# Patient Record
Sex: Male | Born: 1960 | Race: White | Hispanic: No | Marital: Married | Smoking: Former smoker
Health system: Southern US, Community
[De-identification: ages and names within clinical notes are randomized; demographics above are authoritative.]

## PROBLEM LIST (undated history)

## (undated) DIAGNOSIS — I2510 Atherosclerotic heart disease of native coronary artery without angina pectoris: Secondary | ICD-10-CM

## (undated) DIAGNOSIS — I10 Essential (primary) hypertension: Secondary | ICD-10-CM

## (undated) DIAGNOSIS — E785 Hyperlipidemia, unspecified: Secondary | ICD-10-CM

## (undated) DIAGNOSIS — D691 Qualitative platelet defects: Secondary | ICD-10-CM

## (undated) DIAGNOSIS — F419 Anxiety disorder, unspecified: Secondary | ICD-10-CM

## (undated) DIAGNOSIS — I639 Cerebral infarction, unspecified: Secondary | ICD-10-CM

## (undated) DIAGNOSIS — I509 Heart failure, unspecified: Secondary | ICD-10-CM

## (undated) HISTORY — PX: CARDIAC CATHETERIZATION: SHX172

## (undated) HISTORY — DX: Atherosclerotic heart disease of native coronary artery without angina pectoris: I25.10

## (undated) HISTORY — DX: Qualitative platelet defects: D69.1

## (undated) HISTORY — DX: Hyperlipidemia, unspecified: E78.5

## (undated) HISTORY — DX: Anxiety disorder, unspecified: F41.9

## (undated) HISTORY — DX: Cerebral infarction, unspecified: I63.9

## (undated) HISTORY — DX: Essential (primary) hypertension: I10

---

## 1999-09-06 HISTORY — PX: CORONARY ARTERY BYPASS GRAFT: SHX141

## 2004-03-25 ENCOUNTER — Other Ambulatory Visit: Payer: Self-pay

## 2004-03-26 ENCOUNTER — Other Ambulatory Visit: Payer: Self-pay

## 2004-04-17 ENCOUNTER — Other Ambulatory Visit: Payer: Self-pay

## 2004-04-18 ENCOUNTER — Other Ambulatory Visit: Payer: Self-pay

## 2005-01-22 ENCOUNTER — Emergency Department: Payer: Self-pay | Admitting: Emergency Medicine

## 2005-04-20 ENCOUNTER — Observation Stay: Payer: Self-pay | Admitting: Internal Medicine

## 2005-04-20 ENCOUNTER — Other Ambulatory Visit: Payer: Self-pay

## 2006-02-14 ENCOUNTER — Other Ambulatory Visit: Payer: Self-pay

## 2006-02-14 ENCOUNTER — Emergency Department: Payer: Self-pay | Admitting: Emergency Medicine

## 2006-07-19 ENCOUNTER — Other Ambulatory Visit: Payer: Self-pay

## 2006-07-20 ENCOUNTER — Other Ambulatory Visit: Payer: Self-pay

## 2006-07-20 ENCOUNTER — Inpatient Hospital Stay: Payer: Self-pay | Admitting: Internal Medicine

## 2006-12-27 ENCOUNTER — Emergency Department: Payer: Self-pay | Admitting: Emergency Medicine

## 2006-12-27 ENCOUNTER — Other Ambulatory Visit: Payer: Self-pay

## 2007-04-08 ENCOUNTER — Other Ambulatory Visit: Payer: Self-pay

## 2007-04-08 ENCOUNTER — Inpatient Hospital Stay: Payer: Self-pay | Admitting: Internal Medicine

## 2007-04-09 ENCOUNTER — Other Ambulatory Visit: Payer: Self-pay

## 2008-01-14 ENCOUNTER — Ambulatory Visit: Payer: Self-pay | Admitting: Cardiovascular Disease

## 2008-10-10 ENCOUNTER — Ambulatory Visit: Payer: Self-pay | Admitting: Family Medicine

## 2010-05-28 ENCOUNTER — Emergency Department: Payer: Self-pay | Admitting: Emergency Medicine

## 2010-10-17 ENCOUNTER — Emergency Department: Payer: Self-pay | Admitting: Emergency Medicine

## 2011-01-31 ENCOUNTER — Inpatient Hospital Stay: Payer: Self-pay | Admitting: Internal Medicine

## 2011-11-25 ENCOUNTER — Observation Stay: Payer: Self-pay | Admitting: Internal Medicine

## 2011-11-25 LAB — BASIC METABOLIC PANEL
Anion Gap: 6 — ABNORMAL LOW (ref 7–16)
BUN: 8 mg/dL (ref 7–18)
Calcium, Total: 8.6 mg/dL (ref 8.5–10.1)
Creatinine: 0.66 mg/dL (ref 0.60–1.30)
EGFR (African American): >60
EGFR (Non-African Amer.): >60
Potassium: 3.8 mmol/L (ref 3.5–5.1)
Sodium: 139 mmol/L (ref 136–145)

## 2011-11-25 LAB — PROTIME-INR
INR: 0.9
Prothrombin Time: 12.1 secs (ref 11.5–14.7)

## 2011-11-25 LAB — COMPREHENSIVE METABOLIC PANEL
Albumin: 4.2 g/dL (ref 3.4–5.0)
Anion Gap: 16 (ref 7–16)
BUN: 9 mg/dL (ref 7–18)
Calcium, Total: 8.9 mg/dL (ref 8.5–10.1)
Glucose: 172 mg/dL — ABNORMAL HIGH (ref 65–99)
Potassium: 4 mmol/L (ref 3.5–5.1)
SGOT(AST): 47 U/L — ABNORMAL HIGH (ref 15–37)
Sodium: 146 mmol/L — ABNORMAL HIGH (ref 136–145)
Total Protein: 7.8 g/dL (ref 6.4–8.2)

## 2011-11-25 LAB — CBC WITH DIFFERENTIAL/PLATELET
Basophil #: 0.1 10*3/uL (ref 0.0–0.1)
Eosinophil #: 0.1 10*3/uL (ref 0.0–0.7)
Eosinophil #: 0 10*3/uL (ref 0.0–0.7)
Eosinophil %: 0.6 %
HCT: 42.6 % (ref 40.0–52.0)
Lymphocyte #: 1.6 10*3/uL (ref 1.0–3.6)
Lymphocyte %: 25.5 %
Lymphocyte %: 30.1 %
MCH: 29.8 pg (ref 26.0–34.0)
MCV: 89 fL (ref 80–100)
MCV: 89 fL (ref 80–100)
Monocyte %: 6.3 %
Monocyte %: 6.2 %
Neutrophil #: 3.9 10*3/uL (ref 1.4–6.5)
Neutrophil #: 3.3 10*3/uL (ref 1.4–6.5)
Neutrophil %: 66 %
Neutrophil %: 62.6 %
RBC: 4.76 10*6/uL (ref 4.40–5.90)
RDW: 13.2 % (ref 11.5–14.5)
WBC: 5.9 10*3/uL (ref 3.8–10.6)
WBC: 5.2 10*3/uL (ref 3.8–10.6)

## 2011-11-25 LAB — MAGNESIUM: Magnesium: 1.7 mg/dL — ABNORMAL LOW

## 2011-11-25 LAB — LIPID PANEL: Ldl Cholesterol, Calc: 51 mg/dL (ref 0–100)

## 2011-11-25 LAB — CK TOTAL AND CKMB (NOT AT ARMC): CK-MB: 0.8 ng/mL (ref 0.5–3.6)

## 2011-11-25 LAB — TROPONIN I: Troponin-I: <0.02 ng/mL

## 2012-01-02 ENCOUNTER — Encounter: Payer: Self-pay | Admitting: Cardiovascular Disease

## 2012-01-02 ENCOUNTER — Ambulatory Visit (INDEPENDENT_AMBULATORY_CARE_PROVIDER_SITE_OTHER): Payer: Medicare Other | Admitting: Cardiovascular Disease

## 2012-01-02 DIAGNOSIS — R079 Chest pain, unspecified: Secondary | ICD-10-CM

## 2012-01-02 DIAGNOSIS — I2510 Atherosclerotic heart disease of native coronary artery without angina pectoris: Secondary | ICD-10-CM | POA: Insufficient documentation

## 2012-01-02 DIAGNOSIS — Z01818 Encounter for other preprocedural examination: Secondary | ICD-10-CM

## 2012-01-02 DIAGNOSIS — Z5181 Encounter for therapeutic drug level monitoring: Secondary | ICD-10-CM

## 2012-01-02 DIAGNOSIS — E785 Hyperlipidemia, unspecified: Secondary | ICD-10-CM | POA: Insufficient documentation

## 2012-01-02 DIAGNOSIS — I10 Essential (primary) hypertension: Secondary | ICD-10-CM | POA: Insufficient documentation

## 2012-01-02 NOTE — Progress Notes (Signed)
HPI  [name] is a 51 year old male who is here to reestablish cardiovascular care. He was last seen by me in 2011. He has known history of coronary artery disease status post CABG as well as stent placement to the first diagonal. Most recent cardiac catheterization was done by me in 2009 which showed patent graft and stent. There was no other obstructive disease. He has done well since then up until recently. He was hospitalized at [facility] in March of this year due to chest pain. He ruled out for myocardial infarction. He had a nuclear stress test done which showed no evidence of ischemia with normal ejection fraction. Unfortunately, the patient continues to have substernal chest tightness radiating to his left arm which happens if he exerts himself. It usually happens if he walks more than 2 blocks and resolves with rest. He took one nitroglycerin once and it relieved the discomfort. The chest pain is associated with dyspnea. He denies palpitations, syncope or presyncope.  Allergies  Allergen Reactions  . Penicillins Hives and Swelling     Current Outpatient Prescriptions on File Prior to Visit  Medication Sig Dispense Refill  . atenolol (TENORMIN) 50 MG tablet Take 1 tablet by mouth Daily.      . CRESTOR 40 MG tablet Take 1 tablet by mouth Every PM.      . GLIPIZIDE XL 5 MG 24 hr tablet Take 1 tablet by mouth Daily.      . isosorbide mononitrate (IMDUR) 30 MG 24 hr tablet Take 3 tablets by mouth Daily.      . metFORMIN (GLUCOPHAGE) 1000 MG tablet Take 1 tablet by mouth Twice daily.      . nitroGLYCERIN (NITROSTAT) 0.4 MG SL tablet Place 0.4 mg under the tongue every 5 (five) minutes as needed.      . quinapril (ACCUPRIL) 10 MG tablet Take 1 tablet by mouth Daily.      . ranitidine (ZANTAC) 150 MG tablet Take 1 tablet by mouth Twice daily.         Past Medical History  Diagnosis Date  . Coronary artery disease     Previous CABG. Most recent cath 01/2008: patent LIMA to LAD, patent  stent in first diagonal. Normal EF.   Marland Kitchen Hyperlipidemia   . Hypertension   . Diabetes mellitus   . Thrombocytopathia   . Anxiety      Past Surgical History  Procedure Date  . Coronary artery bypass graft 2001    [facility]     Family History  Problem Relation Age of Onset  . Diabetes      fx  . Cancer      mat. family     History   Social History  . Marital Status: Married    Spouse Name: N/A    Number of Children: N/A  . Years of Education: N/A   Occupational History  . Not on file.   Social History Main Topics  . Smoking status: Former Games developer  . Smokeless tobacco: Never Used  . Alcohol Use: No  . Drug Use: No  . Sexually Active: Not on file   Other Topics Concern  . Not on file   Social History Narrative  . No narrative on file     ROS Constitutional: Negative for fever, chills, diaphoresis, activity change, appetite change and fatigue.  HENT: Negative for hearing loss, nosebleeds, congestion, sore throat, facial swelling, drooling, trouble swallowing, neck pain, voice change, sinus pressure and tinnitus.  Eyes: Negative  for photophobia, pain, discharge and visual disturbance.  Respiratory: Negative for apnea, cough and wheezing.  Cardiovascular: Negative for  palpitations and leg swelling.  Gastrointestinal: Negative for nausea, vomiting, abdominal pain, diarrhea, constipation, blood in stool and abdominal distention.  Genitourinary: Negative for dysuria, urgency, frequency, hematuria and decreased urine volume.  Musculoskeletal: Negative for myalgias, back pain, joint swelling, arthralgias and gait problem.  Skin: Negative for color change, pallor, rash and wound.  Neurological: Negative for dizziness, tremors, seizures, syncope, speech difficulty, weakness, light-headedness, numbness and headaches.  Psychiatric/Behavioral: Negative for suicidal ideas, hallucinations, behavioral problems and agitation. The patient is  nervous/anxious.     PHYSICAL  EXAM   BP 130/66  Ht 5\' 6"  (1.676 m)  Wt 173 lb (78.472 kg)  BMI 27.92 kg/m2  Constitutional: He is oriented to person, place, and time. He appears well-developed and well-nourished. No distress.  HENT: No nasal discharge.  Head: Normocephalic and atraumatic.  Eyes: Pupils are equal and round. Right eye exhibits no discharge. Left eye exhibits no discharge.  Neck: Normal range of motion. Neck supple. No JVD present. No thyromegaly present.  Cardiovascular: Normal rate, regular rhythm, normal heart sounds and. Exam reveals no gallop and no friction rub. No murmur heard.  Pulmonary/Chest: Effort normal and breath sounds normal. No stridor. No respiratory distress. He has no wheezes. He has no rales. He exhibits no tenderness.  Abdominal: Soft. Bowel sounds are normal. He exhibits no distension. There is no tenderness. There is no rebound and no guarding.  Musculoskeletal: Normal range of motion. He exhibits no edema and no tenderness.  Neurological: He is alert and oriented to person, place, and time. Coordination normal.  Skin: Skin is warm and dry. No rash noted. He is not diaphoretic. No erythema. No pallor.  Psychiatric: He has a normal mood and affect. His behavior is normal. Judgment and thought content normal.      EKG: Normal sinus rhythm with possible old inferior infarct nonspecific T wave changes.   ASSESSMENT AND PLAN

## 2012-01-02 NOTE — Assessment & Plan Note (Signed)
He is on Crestor 40 mg once daily. I recommend a target LDL is less than 70.

## 2012-01-02 NOTE — Patient Instructions (Addendum)
You are scheduled for a Cardiac Cath on Jan 13, 2012 arrive at 7:00 am at [facility] [place] [facility] entrance. Please do not take your metformin the am of procedure.  Do not eat or drink anything after midnight the night before the cardiac cath. You will have blood work today for a CBC,BMET and Pt/INR.  Follow up with Dr. Kirke Corin in two weeks.

## 2012-01-02 NOTE — Assessment & Plan Note (Signed)
The patient continues to have chest pain suggestive of class III angina in spite of optimal medical therapy. There might be a component of anxiety as well. Due to his persistent symptoms, I recommend proceeding with cardiac catheterization for definitive diagnosis. Most recent catheterization was done through the left femoral artery to avoid the scar tissue on the right side.  I will likely try to go through his left radial artery his Allen's test is normal.

## 2012-01-02 NOTE — Assessment & Plan Note (Signed)
His blood pressure was elevated during his recent hospitalization but seems to be optimal at this time. Continue current medications. If high blood pressure is a recurrent problem, we might consider switching atenolol to carvedilol which has better metabolic profile in diabetics especially.

## 2012-01-03 LAB — CBC WITH DIFFERENTIAL/PLATELET
Eos: 2 % (ref 0–7)
Lymphocytes Absolute: 1.4 10*3/uL (ref 0.7–4.5)
Lymphs: 30 % (ref 14–46)
MCV: 86 fL (ref 79–97)
Monocytes: 7 % (ref 4–13)
Neutrophils Absolute: 2.8 10*3/uL (ref 1.8–7.8)

## 2012-01-03 LAB — BASIC METABOLIC PANEL
BUN/Creatinine Ratio: 18 (ref 9–20)
BUN: 14 mg/dL (ref 6–24)
CO2: 21 mmol/L (ref 20–32)
Creatinine, Ser: 0.79 mg/dL (ref 0.76–1.27)
GFR calc Af Amer: 119 mL/min/{1.73_m2} (ref >59)
Glucose: 234 mg/dL — ABNORMAL HIGH (ref 65–99)

## 2012-01-03 LAB — PROTIME-INR
INR: 1 (ref 0.8–1.2)
Prothrombin Time: 10.6 s (ref 9.1–12.0)

## 2012-01-13 ENCOUNTER — Ambulatory Visit: Payer: Self-pay | Admitting: Cardiology

## 2012-01-13 DIAGNOSIS — I2510 Atherosclerotic heart disease of native coronary artery without angina pectoris: Secondary | ICD-10-CM

## 2012-01-14 DIAGNOSIS — I209 Angina pectoris, unspecified: Secondary | ICD-10-CM

## 2012-01-14 LAB — BASIC METABOLIC PANEL
BUN: 6 mg/dL — ABNORMAL LOW (ref 7–18)
Calcium, Total: 8.1 mg/dL — ABNORMAL LOW (ref 8.5–10.1)
Co2: 25 mmol/L (ref 21–32)
Creatinine: 0.79 mg/dL (ref 0.60–1.30)
EGFR (African American): >60
Osmolality: 282 (ref 275–301)
Sodium: 143 mmol/L (ref 136–145)

## 2012-01-14 LAB — CK TOTAL AND CKMB (NOT AT ARMC)
CK, Total: 54 U/L (ref 35–232)
CK-MB: 0.6 ng/mL (ref 0.5–3.6)

## 2012-01-16 ENCOUNTER — Ambulatory Visit (INDEPENDENT_AMBULATORY_CARE_PROVIDER_SITE_OTHER): Payer: Medicare Other | Admitting: Cardiovascular Disease

## 2012-01-16 ENCOUNTER — Encounter: Payer: Self-pay | Admitting: Cardiovascular Disease

## 2012-01-16 VITALS — BP 155/93 | HR 58 | Ht 66.0 in | Wt 168.0 lb

## 2012-01-16 DIAGNOSIS — R0609 Other forms of dyspnea: Secondary | ICD-10-CM

## 2012-01-16 DIAGNOSIS — F411 Generalized anxiety disorder: Secondary | ICD-10-CM

## 2012-01-16 DIAGNOSIS — I10 Essential (primary) hypertension: Secondary | ICD-10-CM

## 2012-01-16 DIAGNOSIS — I2510 Atherosclerotic heart disease of native coronary artery without angina pectoris: Secondary | ICD-10-CM

## 2012-01-16 DIAGNOSIS — E785 Hyperlipidemia, unspecified: Secondary | ICD-10-CM

## 2012-01-16 DIAGNOSIS — F419 Anxiety disorder, unspecified: Secondary | ICD-10-CM | POA: Insufficient documentation

## 2012-01-16 DIAGNOSIS — R0600 Dyspnea, unspecified: Secondary | ICD-10-CM

## 2012-01-16 MED ORDER — LORAZEPAM 0.5 MG PO TABS: 0.5000 mg | ORAL_TABLET | Freq: Three times a day (TID) | ORAL | Status: DC | PRN

## 2012-01-16 NOTE — Progress Notes (Signed)
HPI  Scott Doyle is a 51 year old male who is here today for a followup visit. He was seen by me recently for exertional dyspnea and chest pain. He underwent cardiac catheterization last week which showed patent LIMA to LAD and patent stents in the first diagonal with mild instent restenosis. However, there was a 90% ostial stenosis in the first diagonal. He underwent an angioplasty and drug-eluting stent placement to the ostial diagonal without complications. He has been doing reasonably well. However, it seems that his anxiety has worsened since the procedure. He is not fasting at all and having hard time sleeping at night. He hasn't had any chest discomfort but does complain of occasional dyspnea.  Allergies  Allergen Reactions  . Penicillins Hives and Swelling     Current Outpatient Prescriptions on File Prior to Visit  Medication Sig Dispense Refill  . aspirin 81 MG tablet Take 81 mg by mouth daily.      Marland Kitchen atenolol (TENORMIN) 50 MG tablet Take 1 tablet by mouth Daily.      . CRESTOR 40 MG tablet Take 1 tablet by mouth Every PM.      . fish oil-omega-3 fatty acids 1000 MG capsule Take 1 g by mouth daily.      Marland Kitchen GLIPIZIDE XL 5 MG 24 hr tablet Take 1 tablet by mouth Daily.      . isosorbide mononitrate (IMDUR) 30 MG 24 hr tablet Take 3 tablets by mouth Daily.      . metFORMIN (GLUCOPHAGE) 1000 MG tablet Take 1 tablet by mouth Twice daily.      . nitroGLYCERIN (NITROSTAT) 0.4 MG SL tablet Place 0.4 mg under the tongue every 5 (five) minutes as needed.      . quinapril (ACCUPRIL) 10 MG tablet Take 1 tablet by mouth Daily.      . ranitidine (ZANTAC) 150 MG tablet Take 1 tablet by mouth Twice daily.      . Tamsulosin HCl (FLOMAX) 0.4 MG CAPS Take 1 tablet by mouth Daily.         Past Medical History  Diagnosis Date  . Hyperlipidemia   . Hypertension   . Diabetes mellitus   . Thrombocytopathia   . Anxiety   . Coronary artery disease     Previous CABG. Most recent cath 01/2012:  patent LIMA to LAD, patent stent in first diagonal with mild ISR, 90% ostial disease. Normal EF.  PCI and DES placement to ostial D1 extending into LAD.      Past Surgical History  Procedure Date  . Coronary artery bypass graft 2001    [facility]  . Cardiac catheterization      Family History  Problem Relation Age of Onset  . Diabetes      fx  . Cancer      mat. family  . Hypertension Mother   . Heart disease Mother      History   Social History  . Marital Status: Married    Spouse Name: N/A    Number of Children: N/A  . Years of Education: N/A   Occupational History  . Not on file.   Social History Main Topics  . Smoking status: Former Games developer  . Smokeless tobacco: Never Used  . Alcohol Use: No  . Drug Use: No  . Sexually Active: Not on file   Other Topics Concern  . Not on file   Social History Narrative  . No narrative on file     PHYSICAL EXAM  BP 155/93  Pulse 58  Ht 5\' 6"  (1.676 m)  Wt 168 lb (76.204 kg)  BMI 27.12 kg/m2 Constitutional: He is oriented to person, place, and time. He appears well-developed and well-nourished. No distress.  HENT: No nasal discharge.  Head: Normocephalic and atraumatic.  Eyes: Pupils are equal and round. Right eye exhibits no discharge. Left eye exhibits no discharge.  Neck: Normal range of motion. Neck supple. No JVD present. No thyromegaly present.  Cardiovascular: Normal rate, regular rhythm, normal heart sounds and. Exam reveals no gallop and no friction rub. No murmur heard.  Pulmonary/Chest: Effort normal and breath sounds normal. No stridor. No respiratory distress. He has no wheezes. He has no rales. He exhibits no tenderness.  Abdominal: Soft. Bowel sounds are normal. He exhibits no distension. There is no tenderness. There is no rebound and no guarding.  Musculoskeletal: Normal range of motion. He exhibits no edema and no tenderness.  Neurological: He is alert and oriented to person, place, and time.  Coordination normal.  Skin: Skin is warm and dry. No rash noted. He is not diaphoretic. No erythema. No pallor.  Psychiatric: He has a normal mood and affect. His behavior is normal. Judgment and thought content normal.  Left radial: The pulse is slightly diminished but there is no hematoma. Cardiac cath was attempted to the left radial artery but there was spasm. Right groin: There is no hematoma.     EKG: Sinus bradycardia with nonspecific IVCD. No significant ST changes.   ASSESSMENT AND PLAN

## 2012-01-16 NOTE — Assessment & Plan Note (Signed)
He is on Crestor 40 mg once daily. I recommend a target LDL is less than 70. 

## 2012-01-16 NOTE — Assessment & Plan Note (Signed)
The patient had a recent successful angioplasty and drug-eluting stent placement to the ostial first diagonal. He seems to be doing reasonably well but continues to suffer from significant anxiety. I recommend continuing dual antiplatelet therapy with aspirin and Plavix for at least one year. After that, Plavix can likely be discontinued. Continue aggressive treatment of risk factors. I recommend starting cardiac rehabilitation.

## 2012-01-16 NOTE — Assessment & Plan Note (Signed)
His blood pressure is elevated today is likely due to anxiety. I will Continue to monitor and consider switching atenolol to carvedilol during next visit if needed.

## 2012-01-16 NOTE — Patient Instructions (Signed)
Take Ativan as needed.  Start cardiac rehab.  Follow up in 2 months.

## 2012-01-16 NOTE — Assessment & Plan Note (Signed)
This seems to be a significant issue with him which might affect the healing process. I would provide him with short-term course of Ativan which has helped his symptoms significantly. I asked him to followup with his primary care physician Dr. Mayford Knife for further management. The patient might need to be treated with an SSRI if his symptoms do not improve.

## 2012-01-17 ENCOUNTER — Telehealth: Payer: Self-pay | Admitting: Cardiovascular Disease

## 2012-01-17 DIAGNOSIS — G459 Transient cerebral ischemic attack, unspecified: Secondary | ICD-10-CM

## 2012-01-17 NOTE — Telephone Encounter (Signed)
Dr Kirke Corin will you evaluate this please?

## 2012-01-17 NOTE — Telephone Encounter (Signed)
Can you please schedule this carotid.

## 2012-01-17 NOTE — Telephone Encounter (Signed)
I ordered carotid doppler. Please schedule.

## 2012-01-17 NOTE — Telephone Encounter (Signed)
Pt would like to have a Caroitd US done due to loss and change in vision. Amaurosis fugax. Wants Dr Kirke Corin to order

## 2012-01-24 ENCOUNTER — Encounter: Payer: Self-pay | Admitting: Cardiovascular Disease

## 2012-02-02 ENCOUNTER — Encounter (INDEPENDENT_AMBULATORY_CARE_PROVIDER_SITE_OTHER): Payer: Medicare Other

## 2012-02-02 DIAGNOSIS — R42 Dizziness and giddiness: Secondary | ICD-10-CM

## 2012-02-02 DIAGNOSIS — H53129 Transient visual loss, unspecified eye: Secondary | ICD-10-CM

## 2012-02-02 DIAGNOSIS — I6529 Occlusion and stenosis of unspecified carotid artery: Secondary | ICD-10-CM

## 2012-02-02 DIAGNOSIS — G459 Transient cerebral ischemic attack, unspecified: Secondary | ICD-10-CM

## 2012-02-04 ENCOUNTER — Encounter: Payer: Self-pay | Admitting: Cardiovascular Disease

## 2012-02-13 ENCOUNTER — Telehealth: Payer: Self-pay | Admitting: Cardiovascular Disease

## 2012-02-13 NOTE — Telephone Encounter (Signed)
New Problem: ° ° ° ° ° °Patient called in returning your phone call. Please call back. °

## 2012-02-13 NOTE — Telephone Encounter (Signed)
Carotid doppler results given to pt.

## 2012-03-19 ENCOUNTER — Ambulatory Visit (INDEPENDENT_AMBULATORY_CARE_PROVIDER_SITE_OTHER): Payer: Medicare Other | Admitting: Cardiovascular Disease

## 2012-03-19 ENCOUNTER — Encounter: Payer: Self-pay | Admitting: Cardiovascular Disease

## 2012-03-19 ENCOUNTER — Emergency Department: Payer: Self-pay | Admitting: *Deleted

## 2012-03-19 VITALS — BP 108/62 | HR 61 | Ht 66.0 in | Wt 174.2 lb

## 2012-03-19 DIAGNOSIS — F411 Generalized anxiety disorder: Secondary | ICD-10-CM

## 2012-03-19 DIAGNOSIS — E785 Hyperlipidemia, unspecified: Secondary | ICD-10-CM

## 2012-03-19 DIAGNOSIS — F419 Anxiety disorder, unspecified: Secondary | ICD-10-CM

## 2012-03-19 DIAGNOSIS — I2510 Atherosclerotic heart disease of native coronary artery without angina pectoris: Secondary | ICD-10-CM

## 2012-03-19 DIAGNOSIS — I10 Essential (primary) hypertension: Secondary | ICD-10-CM

## 2012-03-19 NOTE — Assessment & Plan Note (Signed)
He is on Crestor 40 mg and fish oil once daily. I recommend a target LDL is less than 70.

## 2012-03-19 NOTE — Patient Instructions (Addendum)
You are doing well. Continue same medications.  Follow up in 6 months.   

## 2012-03-19 NOTE — Assessment & Plan Note (Signed)
Blood pressure is well controlled. Continue current medications. 

## 2012-03-19 NOTE — Assessment & Plan Note (Addendum)
He has no symptoms suggestive of angina. Continue medical therapy. Continue cardiac rehabilitation.

## 2012-03-19 NOTE — Assessment & Plan Note (Signed)
He had significant improvement with Zoloft. He is off the medication for the last 4 days. I explained to him that Zoloft is one of the safest SSRI from a cardiac standpoint. He is going to followup with Dr. Mayford Knife to discuss whether he needs to continue the medication.

## 2012-03-19 NOTE — Progress Notes (Signed)
HPI  [name] is a 51 year old male who is here today for a followup visit regarding coronary artery disease. Most recent cardiac catheterization in May of this year showed patent LIMA to LAD and patent stents in the first diagonal with mild instent restenosis. However, there was a 90% ostial stenosis in the first diagonal. He underwent an angioplasty and drug-eluting stent placement to the ostial diagonal without complications. He has been doing reasonably well.  During last visit, he had significant anxiety symptoms. He saw his primary care physician Dr. Mayford Knife who prescribed Zoloft with dramatic improvement in his symptoms. He took the medication for one month but prefers not to be on it long-term. He is attending cardiac rehabilitation and seems to be doing very well. He denies chest pain or dyspnea.  Allergies  Allergen Reactions  . Penicillins Hives and Swelling     Current Outpatient Prescriptions on File Prior to Visit  Medication Sig Dispense Refill  . aspirin 81 MG tablet Take 81 mg by mouth daily.      Marland Kitchen atenolol (TENORMIN) 50 MG tablet Take 1 tablet by mouth Daily.      . clopidogrel (PLAVIX) 75 MG tablet Take 75 mg by mouth daily.       . CRESTOR 40 MG tablet Take 1 tablet by mouth Every PM.      . docusate sodium (COLACE) 100 MG capsule Take 100 mg by mouth as directed.      . fish oil-omega-3 fatty acids 1000 MG capsule Take 1 g by mouth daily.      Marland Kitchen GLIPIZIDE XL 5 MG 24 hr tablet Take 1 tablet by mouth Daily.      . isosorbide mononitrate (IMDUR) 30 MG 24 hr tablet Take 3 tablets by mouth Daily.      Marland Kitchen LORazepam (ATIVAN) 0.5 MG tablet Take 1 tablet (0.5 mg total) by mouth every 8 (eight) hours as needed.  30 tablet  0  . metFORMIN (GLUCOPHAGE) 1000 MG tablet Take 1 tablet by mouth Twice daily.      . nitroGLYCERIN (NITROSTAT) 0.4 MG SL tablet Place 0.4 mg under the tongue every 5 (five) minutes as needed.      . quinapril (ACCUPRIL) 10 MG tablet Take 1 tablet by mouth  Daily.      . ranitidine (ZANTAC) 150 MG tablet Take 1 tablet by mouth Twice daily.      . Tamsulosin HCl (FLOMAX) 0.4 MG CAPS Take 1 tablet by mouth Daily.         Past Medical History  Diagnosis Date  . Hyperlipidemia   . Hypertension   . Diabetes mellitus   . Thrombocytopathia   . Anxiety   . Coronary artery disease     Previous CABG. Most recent cath 01/2012: patent LIMA to LAD, patent stent in first diagonal with mild ISR, 90% ostial disease. Normal EF.  PCI and DES placement to ostial D1 extending into LAD.      Past Surgical History  Procedure Date  . Coronary artery bypass graft 2001    [facility]  . Cardiac catheterization      Family History  Problem Relation Age of Onset  . Diabetes      fx  . Cancer      mat. family  . Hypertension Mother   . Heart disease Mother      History   Social History  . Marital Status: Married    Spouse Name: N/A    Number of  Children: N/A  . Years of Education: N/A   Occupational History  . Not on file.   Social History Main Topics  . Smoking status: Former Games developer  . Smokeless tobacco: Never Used  . Alcohol Use: No  . Drug Use: No  . Sexually Active: Not on file   Other Topics Concern  . Not on file   Social History Narrative  . No narrative on file      PHYSICAL EXAM   BP 108/62  Pulse 61  Ht 5\' 6"  (1.676 m)  Wt 174 lb 4 oz (79.039 kg)  BMI 28.12 kg/m2 Constitutional: He is oriented to person, place, and time. He appears well-developed and well-nourished. No distress.  HENT: No nasal discharge.  Head: Normocephalic and atraumatic.  Eyes: Pupils are equal and round. Right eye exhibits no discharge. Left eye exhibits no discharge.  Neck: Normal range of motion. Neck supple. No JVD present. No thyromegaly present.  Cardiovascular: Normal rate, regular rhythm, normal heart sounds and. Exam reveals no gallop and no friction rub. No murmur heard.  Pulmonary/Chest: Effort normal and breath sounds normal. No  stridor. No respiratory distress. He has no wheezes. He has no rales. He exhibits no tenderness.  Abdominal: Soft. Bowel sounds are normal. He exhibits no distension. There is no tenderness. There is no rebound and no guarding.  Musculoskeletal: Normal range of motion. He exhibits no edema and no tenderness.  Neurological: He is alert and oriented to person, place, and time. Coordination normal.  Skin: Skin is warm and dry. No rash noted. He is not diaphoretic. No erythema. No pallor.  Psychiatric: He has a normal mood and affect. His behavior is normal. Judgment and thought content normal.       EKG: Normal sinus rhythm with minor J-point elevation in the inferior leads.   ASSESSMENT AND PLAN

## 2012-03-27 ENCOUNTER — Encounter: Payer: Self-pay | Admitting: Cardiovascular Disease

## 2012-04-09 ENCOUNTER — Encounter: Payer: Self-pay | Admitting: Cardiovascular Disease

## 2012-04-17 ENCOUNTER — Ambulatory Visit: Payer: Self-pay | Admitting: Family Medicine

## 2012-05-06 ENCOUNTER — Encounter: Payer: Self-pay | Admitting: Cardiovascular Disease

## 2012-06-05 ENCOUNTER — Encounter: Payer: Self-pay | Admitting: Cardiovascular Disease

## 2012-09-17 ENCOUNTER — Encounter: Payer: Self-pay | Admitting: Cardiovascular Disease

## 2012-09-17 ENCOUNTER — Ambulatory Visit (INDEPENDENT_AMBULATORY_CARE_PROVIDER_SITE_OTHER): Payer: Medicare Other | Admitting: Cardiovascular Disease

## 2012-09-17 VITALS — BP 118/68 | HR 59 | Ht 66.0 in | Wt 175.8 lb

## 2012-09-17 DIAGNOSIS — I2510 Atherosclerotic heart disease of native coronary artery without angina pectoris: Secondary | ICD-10-CM

## 2012-09-17 DIAGNOSIS — I10 Essential (primary) hypertension: Secondary | ICD-10-CM

## 2012-09-17 DIAGNOSIS — E785 Hyperlipidemia, unspecified: Secondary | ICD-10-CM

## 2012-09-17 MED ORDER — ATENOLOL 50 MG PO TABS: 25.0000 mg | ORAL_TABLET | Freq: Every day | ORAL | Status: DC

## 2012-09-17 NOTE — Assessment & Plan Note (Signed)
Patient is doing very well from a cardiac standpoint. He has no symptoms suggestive of angina. Continue medical therapy. He does complain of mild fatigue. He is mildly bradycardic on current dose of atenolol. Thus, I asked him to decrease the dose to 25 mg once daily.

## 2012-09-17 NOTE — Assessment & Plan Note (Signed)
His blood pressure is well controlled on current medications. 

## 2012-09-17 NOTE — Assessment & Plan Note (Signed)
He was switched from Crestor to atorvastatin 40 mg once daily by Dr. Mayford Knife due to cost reasons. The patient seems to be tolerating the medication without reported side effects.

## 2012-09-17 NOTE — Patient Instructions (Addendum)
Decrease Atenolol to 25 mg once daily (1/2 tablet of 50) Follow up in 1 year.

## 2012-09-17 NOTE — Progress Notes (Signed)
HPI  [name] is a 52 year old male who is here today for a followup visit regarding coronary artery disease. He is status post CABG and PCI of diagonal. Most recent cardiac catheterization in May of 2103 showed patent LIMA to LAD and patent stents in the first diagonal with mild instent restenosis. However, there was a 90% ostial stenosis in the first diagonal. He underwent an angioplasty and drug-eluting stent placement to the ostial diagonal without complications. He has been doing reasonably well.  He denies any chest pain or dyspnea. He does complain of mild fatigue.  Allergies  Allergen Reactions  . Penicillins Hives and Swelling     Current Outpatient Prescriptions on File Prior to Visit  Medication Sig Dispense Refill  . aspirin 81 MG tablet Take 81 mg by mouth daily.      Marland Kitchen atenolol (TENORMIN) 50 MG tablet Take 0.5 tablets (25 mg total) by mouth daily.      Marland Kitchen atorvastatin (LIPITOR) 40 MG tablet Take 40 mg by mouth daily.      . clopidogrel (PLAVIX) 75 MG tablet Take 75 mg by mouth daily.       Marland Kitchen docusate sodium (COLACE) 100 MG capsule Take 100 mg by mouth as directed.      . fish oil-omega-3 fatty acids 1000 MG capsule Take 1 g by mouth daily.      Marland Kitchen GLIPIZIDE XL 5 MG 24 hr tablet Take 0.5 tablets by mouth Daily.       . isosorbide mononitrate (IMDUR) 30 MG 24 hr tablet Take 3 tablets by mouth Daily.      Marland Kitchen LORazepam (ATIVAN) 0.5 MG tablet Take 1 tablet (0.5 mg total) by mouth every 8 (eight) hours as needed.  30 tablet  0  . metFORMIN (GLUCOPHAGE) 1000 MG tablet Take 1 tablet by mouth Twice daily.      . nitroGLYCERIN (NITROSTAT) 0.4 MG SL tablet Place 0.4 mg under the tongue every 5 (five) minutes as needed.      . quinapril (ACCUPRIL) 10 MG tablet Take 1 tablet by mouth Daily.      . ranitidine (ZANTAC) 150 MG tablet Take 1 tablet by mouth Twice daily.      . Tamsulosin HCl (FLOMAX) 0.4 MG CAPS Take 1 tablet by mouth Daily.         Past Medical History  Diagnosis Date   . Hyperlipidemia   . Hypertension   . Diabetes mellitus   . Thrombocytopathia   . Anxiety   . Coronary artery disease     Previous CABG. Most recent cath 01/2012: patent LIMA to LAD, patent stent in first diagonal with mild ISR, 90% ostial disease. Normal EF.  PCI and DES placement to ostial D1 extending into LAD.      Past Surgical History  Procedure Date  . Coronary artery bypass graft 2001    [facility]  . Cardiac catheterization      Family History  Problem Relation Age of Onset  . Diabetes      fx  . Cancer      mat. family  . Hypertension Mother   . Heart disease Mother      History   Social History  . Marital Status: Married    Spouse Name: N/A    Number of Children: N/A  . Years of Education: N/A   Occupational History  . Not on file.   Social History Main Topics  . Smoking status: Former Games developer  . Smokeless tobacco:  Never Used  . Alcohol Use: No  . Drug Use: No  . Sexually Active: Not on file   Other Topics Concern  . Not on file   Social History Narrative  . No narrative on file      PHYSICAL EXAM   BP 118/68  Pulse 59  Ht 5\' 6"  (1.676 m)  Wt 175 lb 12 oz (79.72 kg)  BMI 28.37 kg/m2 Constitutional: He is oriented to person, place, and time. He appears well-developed and well-nourished. No distress.  HENT: No nasal discharge.  Head: Normocephalic and atraumatic.  Eyes: Pupils are equal and round. Right eye exhibits no discharge. Left eye exhibits no discharge.  Neck: Normal range of motion. Neck supple. No JVD present. No thyromegaly present.  Cardiovascular: Normal rate, regular rhythm, normal heart sounds and. Exam reveals no gallop and no friction rub. No murmur heard.  Pulmonary/Chest: Effort normal and breath sounds normal. No stridor. No respiratory distress. He has no wheezes. He has no rales. He exhibits no tenderness.  Abdominal: Soft. Bowel sounds are normal. He exhibits no distension. There is no tenderness. There is no rebound  and no guarding.  Musculoskeletal: Normal range of motion. He exhibits no edema and no tenderness.  Neurological: He is alert and oriented to person, place, and time. Coordination normal.  Skin: Skin is warm and dry. No rash noted. He is not diaphoretic. No erythema. No pallor.  Psychiatric: He has a normal mood and affect. His behavior is normal. Judgment and thought content normal.       EKG: Normal sinus rhythm with minor J-point elevation in the inferior leads.   ASSESSMENT AND PLAN

## 2013-01-08 IMAGING — CR SACRUM AND COCCYX - 2+ VIEW
1 series · 3 of 3 positions shown · non-contrast
Comparison: none

REASON FOR EXAM: sciatica with burning pain lower back down leg
COMMENTS:

PROCEDURE:     DXR - DXR SACRUM AND COCCYX  - April 17, 2012  [DATE]
RESULT:     Images of the sacrum and coccyx are obtained. The alignment
appears to be maintained. A definite fracture is not identified. Lumbosacral
junction appears normal. The sacroiliac joints appear to be unremarkable.

[Series 1: t sacrum ap · 0.14mm/px · 3 of 3 slices shown]
[im 1/3]
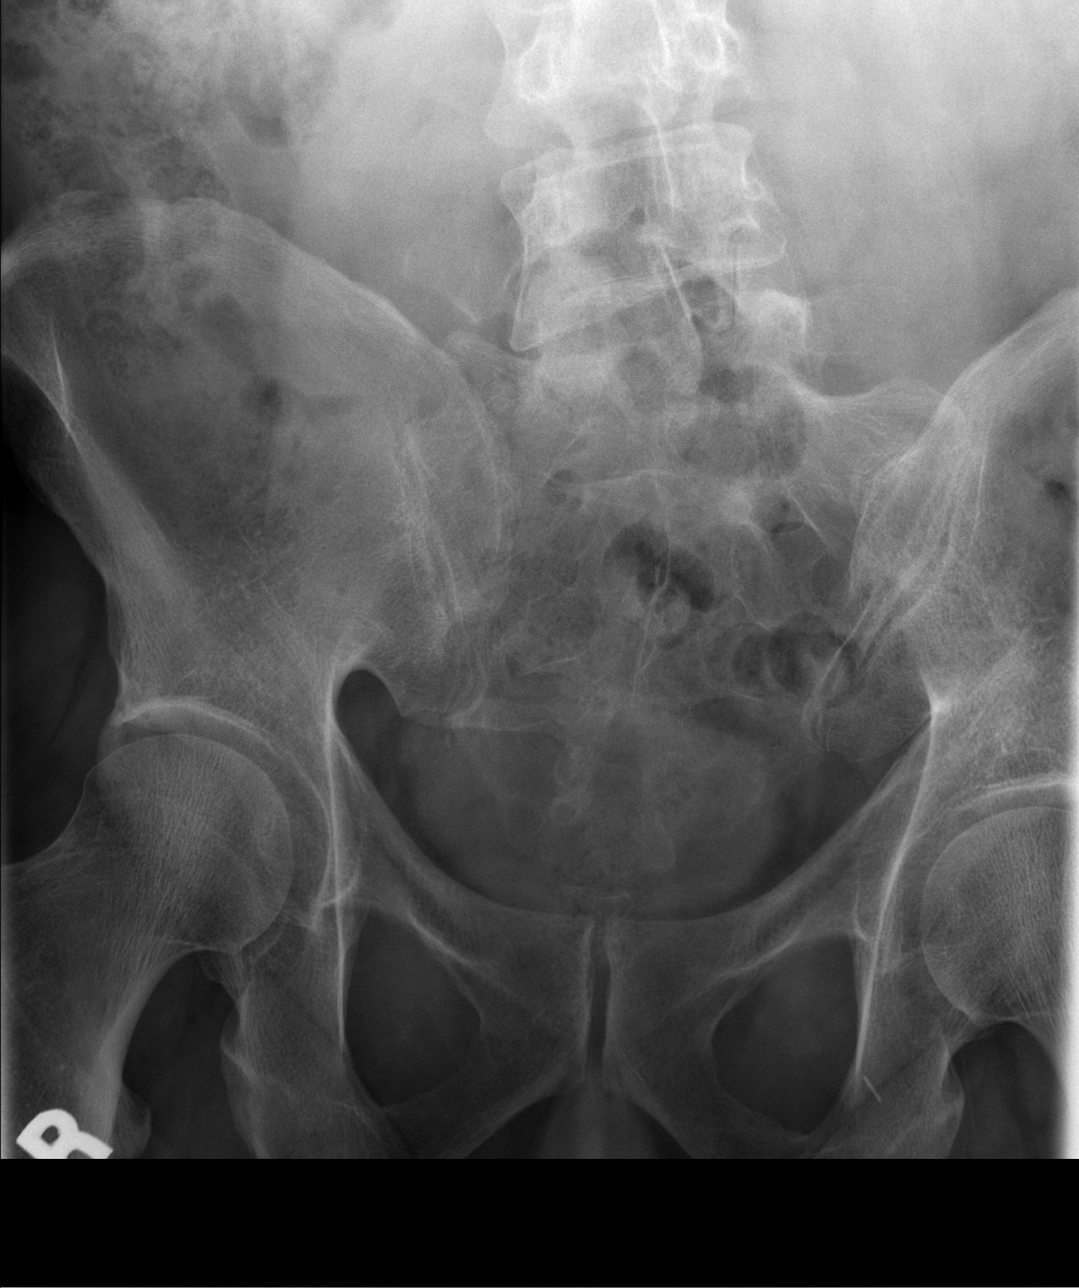
[im 2/3]
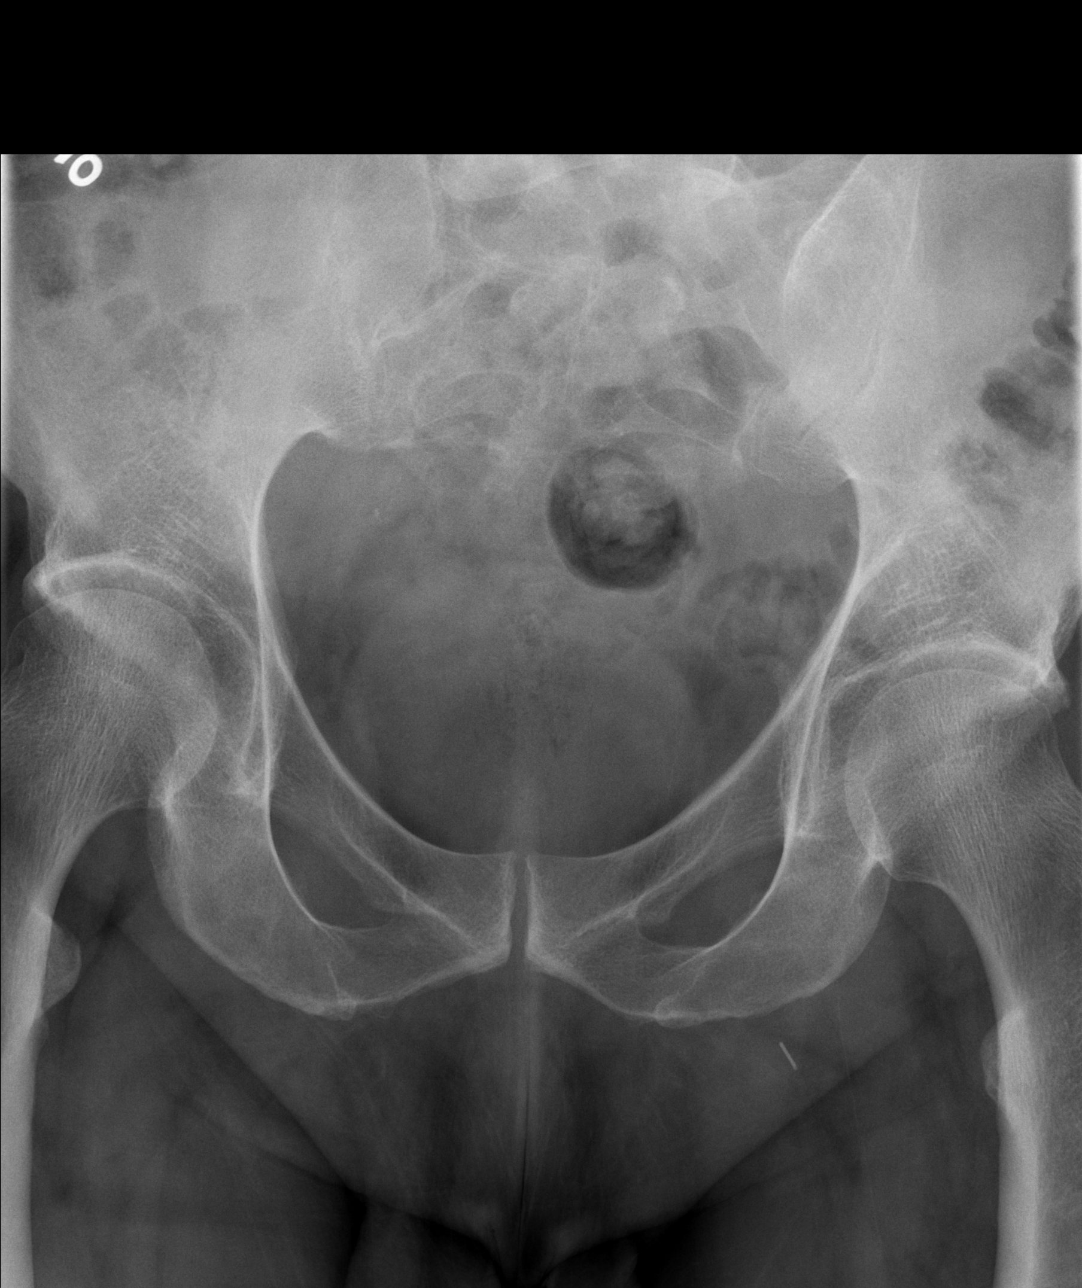
[im 3/3]
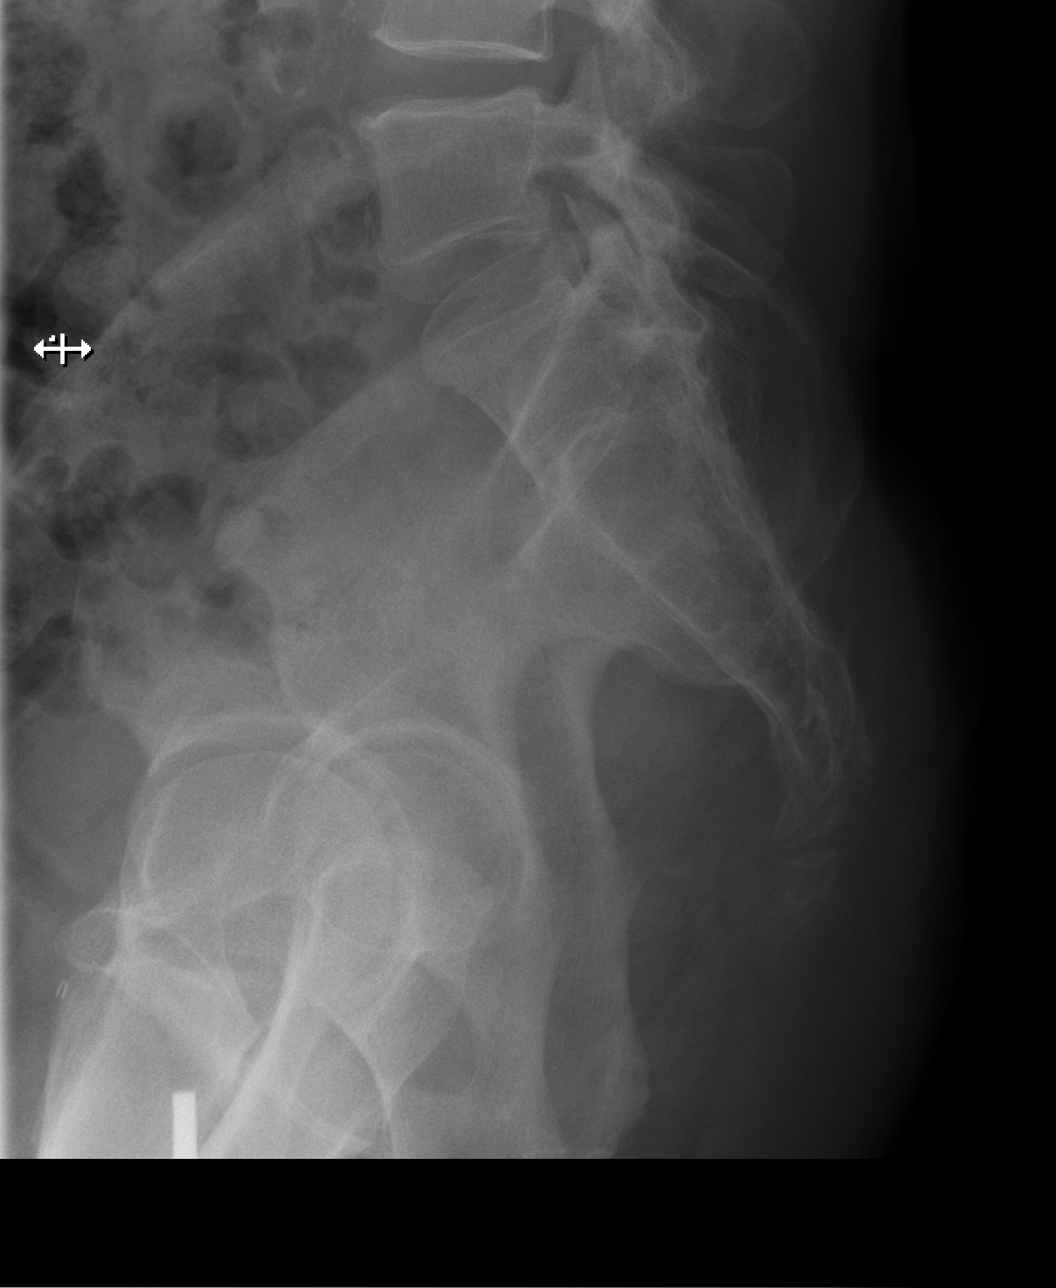

[3 of 3 positions shown; findings below may reference images not displayed]

IMPRESSION: 1. No radiographic evidence of acute sacral or coccyx abnormality. MRI or
nuclear medicine to be considered for further investigation.

[REDACTED]

## 2013-01-11 ENCOUNTER — Telehealth: Payer: Self-pay

## 2013-01-11 NOTE — Telephone Encounter (Signed)
Request from Disability Determination Services , sent to [employer] on 01/11/2013 .

## 2013-02-20 ENCOUNTER — Encounter (INDEPENDENT_AMBULATORY_CARE_PROVIDER_SITE_OTHER): Payer: Medicare Other

## 2013-02-20 DIAGNOSIS — I6529 Occlusion and stenosis of unspecified carotid artery: Secondary | ICD-10-CM

## 2013-06-26 LAB — CBC
HCT: 41.4 % (ref 40.0–52.0)
HGB: 14.3 g/dL (ref 13.0–18.0)
MCH: 29.5 pg (ref 26.0–34.0)
MCV: 85 fL (ref 80–100)
RBC: 4.85 10*6/uL (ref 4.40–5.90)
WBC: 5.5 10*3/uL (ref 3.8–10.6)

## 2013-06-26 LAB — BASIC METABOLIC PANEL
BUN: 8 mg/dL (ref 7–18)
Calcium, Total: 8.7 mg/dL (ref 8.5–10.1)
Co2: 25 mmol/L (ref 21–32)
Creatinine: 1.06 mg/dL (ref 0.60–1.30)
EGFR (African American): >60
EGFR (Non-African Amer.): >60
Osmolality: 279 (ref 275–301)
Potassium: 3.8 mmol/L (ref 3.5–5.1)
Sodium: 137 mmol/L (ref 136–145)

## 2013-06-26 LAB — TROPONIN I: Troponin-I: <0.02 ng/mL

## 2013-06-27 ENCOUNTER — Observation Stay: Payer: Self-pay | Admitting: Internal Medicine

## 2013-06-27 DIAGNOSIS — E785 Hyperlipidemia, unspecified: Secondary | ICD-10-CM

## 2013-06-27 DIAGNOSIS — R079 Chest pain, unspecified: Secondary | ICD-10-CM

## 2013-06-27 DIAGNOSIS — I10 Essential (primary) hypertension: Secondary | ICD-10-CM

## 2013-06-27 DIAGNOSIS — Z9861 Coronary angioplasty status: Secondary | ICD-10-CM

## 2013-06-27 DIAGNOSIS — Z8679 Personal history of other diseases of the circulatory system: Secondary | ICD-10-CM

## 2013-06-27 LAB — TROPONIN I: Troponin-I: <0.02 ng/mL

## 2013-06-27 LAB — CK TOTAL AND CKMB (NOT AT ARMC)
CK, Total: 70 U/L (ref 35–232)
CK, Total: 65 U/L (ref 35–232)
CK, Total: 65 U/L (ref 35–232)
CK-MB: 1.2 ng/mL (ref 0.5–3.6)
CK-MB: 0.9 ng/mL (ref 0.5–3.6)

## 2013-07-12 ENCOUNTER — Encounter: Payer: Medicare Other | Admitting: Cardiovascular Disease

## 2013-07-18 ENCOUNTER — Ambulatory Visit (INDEPENDENT_AMBULATORY_CARE_PROVIDER_SITE_OTHER): Payer: Medicare Other | Admitting: Cardiovascular Disease

## 2013-07-18 ENCOUNTER — Encounter: Payer: Self-pay | Admitting: Cardiovascular Disease

## 2013-07-18 VITALS — BP 140/77 | HR 55 | Ht 69.0 in | Wt 179.8 lb

## 2013-07-18 DIAGNOSIS — E785 Hyperlipidemia, unspecified: Secondary | ICD-10-CM

## 2013-07-18 DIAGNOSIS — I2510 Atherosclerotic heart disease of native coronary artery without angina pectoris: Secondary | ICD-10-CM

## 2013-07-18 DIAGNOSIS — I10 Essential (primary) hypertension: Secondary | ICD-10-CM

## 2013-07-18 NOTE — Progress Notes (Signed)
HPI  [name] is a 52 year old male who is here today for a followup visit regarding coronary artery disease. He is status post CABG and PCI of diagonal. Most recent cardiac catheterization in May of 2103 showed patent LIMA to LAD and patent stents in the first diagonal with mild instent restenosis. However, there was a 90% ostial stenosis in the first diagonal. He underwent an angioplasty and drug-eluting stent placement to the ostial diagonal without complications.  He presented recently to [facility] with atypical chest pain. He ruled out for MI. Nuclear stress test showed no ischemia. No recurrent chest pain since then.   Allergies  Allergen Reactions  . Penicillins Hives and Swelling     Current Outpatient Prescriptions on File Prior to Visit  Medication Sig Dispense Refill  . aspirin 81 MG tablet Take 81 mg by mouth daily.      Marland Kitchen atenolol (TENORMIN) 50 MG tablet Take 0.5 tablets (25 mg total) by mouth daily.      Marland Kitchen atorvastatin (LIPITOR) 40 MG tablet Take 40 mg by mouth daily.      . fish oil-omega-3 fatty acids 1000 MG capsule Take 1 g by mouth daily.      Marland Kitchen GLIPIZIDE XL 5 MG 24 hr tablet Take 0.5 tablets by mouth Daily.       . isosorbide mononitrate (IMDUR) 30 MG 24 hr tablet Take 3 tablets by mouth Daily.      Marland Kitchen LORazepam (ATIVAN) 0.5 MG tablet Take 1 tablet (0.5 mg total) by mouth every 8 (eight) hours as needed.  30 tablet  0  . metFORMIN (GLUCOPHAGE) 1000 MG tablet Take 1 tablet by mouth Twice daily.      . nitroGLYCERIN (NITROSTAT) 0.4 MG SL tablet Place 0.4 mg under the tongue every 5 (five) minutes as needed.      . quinapril (ACCUPRIL) 10 MG tablet Take 1 tablet by mouth Daily.      . ranitidine (ZANTAC) 150 MG tablet Take 1 tablet by mouth Twice daily.      . Tamsulosin HCl (FLOMAX) 0.4 MG CAPS Take 1 tablet by mouth Daily.       No current facility-administered medications on file prior to visit.     Past Medical History  Diagnosis Date  . Hyperlipidemia   .  Hypertension   . Diabetes mellitus   . Thrombocytopathia   . Anxiety   . Coronary artery disease     Previous CABG. Most recent cath 01/2012: patent LIMA to LAD, patent stent in first diagonal with mild ISR, 90% ostial disease. Normal EF.  PCI and DES placement to ostial D1 extending into LAD.      Past Surgical History  Procedure Laterality Date  . Coronary artery bypass graft  2001    [facility]  . Cardiac catheterization       Family History  Problem Relation Age of Onset  . Diabetes      fx  . Cancer      mat. family  . Hypertension Mother   . Heart disease Mother      History   Social History  . Marital Status: Married    Spouse Name: N/A    Number of Children: N/A  . Years of Education: N/A   Occupational History  . Not on file.   Social History Main Topics  . Smoking status: Former Games developer  . Smokeless tobacco: Never Used  . Alcohol Use: No  . Drug Use: No  . Sexual  Activity: Not on file   Other Topics Concern  . Not on file   Social History Narrative  . No narrative on file      PHYSICAL EXAM   BP 140/77  Pulse 55  Ht 5\' 9"  (1.753 m)  Wt 179 lb 12 oz (81.534 kg)  BMI 26.53 kg/m2 Constitutional: He is oriented to person, place, and time. He appears well-developed and well-nourished. No distress.  HENT: No nasal discharge.  Head: Normocephalic and atraumatic.  Eyes: Pupils are equal and round. Right eye exhibits no discharge. Left eye exhibits no discharge.  Neck: Normal range of motion. Neck supple. No JVD present. No thyromegaly present.  Cardiovascular: Normal rate, regular rhythm, normal heart sounds and. Exam reveals no gallop and no friction rub. No murmur heard.  Pulmonary/Chest: Effort normal and breath sounds normal. No stridor. No respiratory distress. He has no wheezes. He has no rales. He exhibits no tenderness.  Abdominal: Soft. Bowel sounds are normal. He exhibits no distension. There is no tenderness. There is no rebound and no  guarding.  Musculoskeletal: Normal range of motion. He exhibits no edema and no tenderness.  Neurological: He is alert and oriented to person, place, and time. Coordination normal.  Skin: Skin is warm and dry. No rash noted. He is not diaphoretic. No erythema. No pallor.  Psychiatric: He has a normal mood and affect. His behavior is normal. Judgment and thought content normal.       EKG: Sinus  Bradycardia  -Short PR syndrome  PRi = 116 -Old inferior-apical infarct  -Incomplete right bundle branch block   ABNORMAL    ASSESSMENT AND PLAN

## 2013-07-18 NOTE — Assessment & Plan Note (Signed)
Continue Atorvastatin.  No results found for this basename: CHOL, HDL, LDLCALC, LDLDIRECT, TRIG, CHOLHDL

## 2013-07-18 NOTE — Patient Instructions (Signed)
Continue same medications.   Your physician wants you to follow-up in: 12 months.  You will receive a reminder letter in the mail two months in advance. If you don't receive a letter, please call our office to schedule the follow-up appointment.  

## 2013-07-18 NOTE — Assessment & Plan Note (Signed)
BP is well controlled 

## 2013-07-18 NOTE — Assessment & Plan Note (Signed)
No recurrent chest pain. Recent stress test showed no evidence of ischemia. Continue medical therapy.  Some of his symptoms seem to be anxiety induced.

## 2013-08-06 ENCOUNTER — Ambulatory Visit: Payer: Self-pay | Admitting: Gastroenterology

## 2013-08-06 LAB — CBC WITH DIFFERENTIAL/PLATELET
Basophil #: 0.1 10*3/uL (ref 0.0–0.1)
Basophil %: 0.8 %
HCT: 44.1 % (ref 40.0–52.0)
HGB: 15.3 g/dL (ref 13.0–18.0)
Lymphocyte %: 22.2 %
MCHC: 34.8 g/dL (ref 32.0–36.0)
MCV: 85 fL (ref 80–100)
Monocyte #: 0.4 x10 3/mm (ref 0.2–1.0)
Neutrophil #: 4.2 10*3/uL (ref 1.4–6.5)
Neutrophil %: 61 %
RBC: 5.2 10*6/uL (ref 4.40–5.90)
RDW: 13.3 % (ref 11.5–14.5)
WBC: 6.9 10*3/uL (ref 3.8–10.6)

## 2014-08-05 HISTORY — PX: CARDIAC CATHETERIZATION: SHX172

## 2014-08-06 ENCOUNTER — Emergency Department: Payer: Self-pay | Admitting: Emergency Medicine

## 2014-08-06 LAB — BASIC METABOLIC PANEL
Anion Gap: 7 (ref 7–16)
BUN: 10 mg/dL (ref 7–18)
CHLORIDE: 101 mmol/L (ref 98–107)
CREATININE: 1.01 mg/dL (ref 0.60–1.30)
Calcium, Total: 8.9 mg/dL (ref 8.5–10.1)
Co2: 30 mmol/L (ref 21–32)
EGFR (African American): >60
EGFR (Non-African Amer.): >60
GLUCOSE: 178 mg/dL — AB (ref 65–99)
OSMOLALITY: 279 (ref 275–301)
POTASSIUM: 3.7 mmol/L (ref 3.5–5.1)
Sodium: 138 mmol/L (ref 136–145)

## 2014-08-06 LAB — CBC
HCT: 45.7 % (ref 40.0–52.0)
HGB: 15.2 g/dL (ref 13.0–18.0)
MCH: 29.1 pg (ref 26.0–34.0)
MCHC: 33.2 g/dL (ref 32.0–36.0)
MCV: 88 fL (ref 80–100)
PLATELETS: 140 10*3/uL — AB (ref 150–440)
RBC: 5.21 10*6/uL (ref 4.40–5.90)
RDW: 13.1 % (ref 11.5–14.5)
WBC: 5.9 10*3/uL (ref 3.8–10.6)

## 2014-08-06 LAB — PRO B NATRIURETIC PEPTIDE: B-Type Natriuretic Peptide: 45 pg/mL (ref 0–125)

## 2014-08-06 LAB — TROPONIN I: Troponin-I: <0.02 ng/mL

## 2014-08-11 ENCOUNTER — Ambulatory Visit (INDEPENDENT_AMBULATORY_CARE_PROVIDER_SITE_OTHER): Payer: Medicare Other | Admitting: Cardiovascular Disease

## 2014-08-11 ENCOUNTER — Encounter: Payer: Self-pay | Admitting: Cardiovascular Disease

## 2014-08-11 VITALS — BP 120/70 | HR 60 | Ht 68.0 in | Wt 177.5 lb

## 2014-08-11 DIAGNOSIS — E785 Hyperlipidemia, unspecified: Secondary | ICD-10-CM

## 2014-08-11 DIAGNOSIS — R079 Chest pain, unspecified: Secondary | ICD-10-CM

## 2014-08-11 DIAGNOSIS — I10 Essential (primary) hypertension: Secondary | ICD-10-CM

## 2014-08-11 DIAGNOSIS — I25118 Atherosclerotic heart disease of native coronary artery with other forms of angina pectoris: Secondary | ICD-10-CM

## 2014-08-11 NOTE — Assessment & Plan Note (Signed)
The patient's current symptoms are suggestive of class III angina in spite of treatment with a beta blocker and long-acting nitroglycerin. I discussed different management options with him including risk stratification with stress testing versus proceeding directly with cardiac catheterization. Given the significant limitations he is having and the maximum medical therapy he is on, I favor proceeding directly with cardiac catheterization. He agrees. I discussed the risks of the procedure extensively.

## 2014-08-11 NOTE — Patient Instructions (Signed)
[facility] Cardiac Cath Instructions   You are scheduled for a Cardiac Cath on:__12/10/15_____  Please arrive at 0730 am on the day of your procedure  You will need to pre-register prior to the day of your procedure.  Enter through the [employer] at [facility].  Registration is the first desk on your right.  Please take the procedure order we have given you in order to be registered appropriately  Do not eat/drink anything after midnight  Someone will need to drive you home  It is recommended someone be with you for the first 24 hours after your procedure  Wear clothes that are easy to get on/off and wear slip on shoes if possible   Medications bring a current list of all medications with you   _x__ Do not take these medications before your procedure: Hold Metformin the day before, the day of and two days after you procedure. Take all other medications as ordered with enough water to swallow safely.  Day of your procedure: Arrive at the [facility] entrance.  Free valet service is available.  After entering the [place] please check-in at the registration desk (1st desk on your right) to receive your armband. After receiving your armband someone will escort you to the cardiac cath/special procedures waiting area.  The usual length of stay after your procedure is about 2 to 3 hours.  This can vary.  If you have any questions, please call our office at [phone], or you may call the cardiac cath lab at [facility] directly at [phone]

## 2014-08-11 NOTE — Assessment & Plan Note (Signed)
Continue treatment with atorvastatin with a target LDL of less than 70. 

## 2014-08-11 NOTE — Progress Notes (Signed)
HPI  [name] is a 53 year old male who is here today for a followup visit regarding coronary artery disease. He is status post CABG and PCI of diagonal. Most recent cardiac catheterization in May of 2103 showed patent LIMA to LAD and patent stents in the first diagonal with mild instent restenosis. However, there was a 90% ostial stenosis in the first diagonal. He underwent an angioplasty and drug-eluting stent placement to the ostial diagonal without complications.  He presented  to [facility] in 2014 with atypical chest pain. He ruled out for MI. Nuclear stress test showed no ischemia.  He reports recent episodes of substernal chest tightness with activities which started about 2 weeks ago and has been getting worse. It is now happening with minimal activities. The chest pain is substernal with no radiation. It has been associated with shortness of breath. He denies chest pain at rest. He called EMS on Friday. ECG showed minimal ST elevation in the inferior leads. He was taken to the emergency room. Cardiac enzymes were negative. Chest x-ray showed no acute abnormality.  Allergies  Allergen Reactions  . Ampicillin Hives  . Penicillins Hives and Swelling     Current Outpatient Prescriptions on File Prior to Visit  Medication Sig Dispense Refill  . aspirin 81 MG tablet Take 81 mg by mouth daily.    Marland Kitchen atenolol (TENORMIN) 50 MG tablet Take 0.5 tablets (25 mg total) by mouth daily.    Marland Kitchen atorvastatin (LIPITOR) 40 MG tablet Take 40 mg by mouth daily.    . fish oil-omega-3 fatty acids 1000 MG capsule Take 1 g by mouth daily.    Marland Kitchen GLIPIZIDE XL 5 MG 24 hr tablet Take 0.5 tablets by mouth Daily.     . isosorbide mononitrate (IMDUR) 30 MG 24 hr tablet Take 3 tablets by mouth Daily.    Marland Kitchen LORazepam (ATIVAN) 0.5 MG tablet Take 1 tablet (0.5 mg total) by mouth every 8 (eight) hours as needed. 30 tablet 0  . metFORMIN (GLUCOPHAGE) 1000 MG tablet Take 1 tablet by mouth Twice daily.    . nitroGLYCERIN  (NITROSTAT) 0.4 MG SL tablet Place 0.4 mg under the tongue every 5 (five) minutes as needed.    . quinapril (ACCUPRIL) 10 MG tablet Take 1 tablet by mouth Daily.    . ranitidine (ZANTAC) 150 MG tablet Take 1 tablet by mouth Twice daily.    . Tamsulosin HCl (FLOMAX) 0.4 MG CAPS Take 1 tablet by mouth Daily.     No current facility-administered medications on file prior to visit.     Past Medical History  Diagnosis Date  . Hyperlipidemia   . Hypertension   . Diabetes mellitus   . Thrombocytopathia   . Anxiety   . Coronary artery disease     Previous CABG. Most recent cath 01/2012: patent LIMA to LAD, patent stent in first diagonal with mild ISR, 90% ostial disease. Normal EF.  PCI and DES placement to ostial D1 extending into LAD.      Past Surgical History  Procedure Laterality Date  . Coronary artery bypass graft  2001    [facility]  . Cardiac catheterization       Family History  Problem Relation Age of Onset  . Diabetes      fx  . Cancer      mat. family  . Hypertension Mother   . Heart disease Mother      History   Social History  . Marital Status: Married  Spouse Name: N/A    Number of Children: N/A  . Years of Education: N/A   Occupational History  . Not on file.   Social History Main Topics  . Smoking status: Former Research scientist (life sciences)  . Smokeless tobacco: Never Used  . Alcohol Use: No  . Drug Use: No  . Sexual Activity: Not on file   Other Topics Concern  . Not on file   Social History Narrative      PHYSICAL EXAM   BP 120/70 mmHg  Pulse 60  Ht 5\' 8"  (1.727 m)  Wt 177 lb 8 oz (80.513 kg)  BMI 26.99 kg/m2 Constitutional: He is oriented to person, place, and time. He appears well-developed and well-nourished. No distress.  HENT: No nasal discharge.  Head: Normocephalic and atraumatic.  Eyes: Pupils are equal and round. Right eye exhibits no discharge. Left eye exhibits no discharge.  Neck: Normal range of motion. Neck supple. No JVD present. No  thyromegaly present.  Cardiovascular: Normal rate, regular rhythm, normal heart sounds and. Exam reveals no gallop and no friction rub. No murmur heard.  Pulmonary/Chest: Effort normal and breath sounds normal. No stridor. No respiratory distress. He has no wheezes. He has no rales. He exhibits no tenderness.  Abdominal: Soft. Bowel sounds are normal. He exhibits no distension. There is no tenderness. There is no rebound and no guarding.  Musculoskeletal: Normal range of motion. He exhibits no edema and no tenderness.  Neurological: He is alert and oriented to person, place, and time. Coordination normal.  Skin: Skin is warm and dry. No rash noted. He is not diaphoretic. No erythema. No pallor.  Psychiatric: He has a normal mood and affect. His behavior is normal. Judgment and thought content normal.       EKG: Sinus  Rhythm  Low voltage with rightward P-axis and rotation -possible pulmonary disease.   ABNORMAL    ASSESSMENT AND PLAN

## 2014-08-11 NOTE — Assessment & Plan Note (Signed)
Blood pressure is well controlled on current medications. 

## 2014-08-13 ENCOUNTER — Telehealth: Payer: Self-pay | Admitting: *Deleted

## 2014-08-13 NOTE — Telephone Encounter (Signed)
Faxed orders to cardiac cath lab at [facility]

## 2014-08-13 NOTE — Telephone Encounter (Signed)
Ivin Booty confirmed receipt

## 2014-08-14 ENCOUNTER — Ambulatory Visit: Payer: Self-pay | Admitting: Cardiovascular Disease

## 2014-08-14 DIAGNOSIS — I2510 Atherosclerotic heart disease of native coronary artery without angina pectoris: Secondary | ICD-10-CM

## 2014-08-15 ENCOUNTER — Encounter: Payer: Self-pay | Admitting: *Deleted

## 2014-08-22 ENCOUNTER — Ambulatory Visit (INDEPENDENT_AMBULATORY_CARE_PROVIDER_SITE_OTHER): Payer: Medicare Other | Admitting: Cardiovascular Disease

## 2014-08-22 ENCOUNTER — Encounter: Payer: Self-pay | Admitting: *Deleted

## 2014-08-22 VITALS — BP 110/70 | HR 55 | Ht 67.0 in | Wt 177.8 lb

## 2014-08-22 DIAGNOSIS — Z9889 Other specified postprocedural states: Secondary | ICD-10-CM

## 2014-08-22 DIAGNOSIS — E785 Hyperlipidemia, unspecified: Secondary | ICD-10-CM

## 2014-08-22 DIAGNOSIS — I10 Essential (primary) hypertension: Secondary | ICD-10-CM

## 2014-08-22 DIAGNOSIS — I2510 Atherosclerotic heart disease of native coronary artery without angina pectoris: Secondary | ICD-10-CM

## 2014-08-22 NOTE — Assessment & Plan Note (Signed)
Blood pressure is well controlled on current medications. 

## 2014-08-22 NOTE — Patient Instructions (Signed)
Continue same medications.   Your physician wants you to follow-up in: 6 months.  You will receive a reminder letter in the mail two months in advance. If you don't receive a letter, please call our office to schedule the follow-up appointment.  

## 2014-08-22 NOTE — Assessment & Plan Note (Signed)
Recent cardiac catheterization showed no evidence of obstructive coronary artery disease. I recommend continuing medical therapy. Chest pain resolved. There was a component of anxiety which might have contributed.

## 2014-08-22 NOTE — Assessment & Plan Note (Signed)
Continue treatment with rosuvastatin with a target LDL of less than 70. This is being monitored by Dr. Lavera Guise.

## 2014-08-22 NOTE — Progress Notes (Signed)
HPI  Scott Doyle is a 53 year old male who is here today for a followup visit regarding coronary artery disease. He is status post CABG and PCI of diagonal. He has other medical problems that include hypertension, hyperlipidemia and type 2 diabetes. He was seen recently for worsening exertional chest pain. I proceeded with cardiac catheterization which showed patent LIMA to LAD and patent stents in the diagonal. There was no evidence of obstructive coronary artery disease. He has been doing well since his cardiac cath and actually reports resolution of chest discomfort.  Allergies  Allergen Reactions  . Ampicillin Hives  . Penicillins Hives and Swelling     Current Outpatient Prescriptions on File Prior to Visit  Medication Sig Dispense Refill  . aspirin 81 MG tablet Take 81 mg by mouth daily.    Marland Kitchen atenolol (TENORMIN) 50 MG tablet Take 0.5 tablets (25 mg total) by mouth daily. (Patient taking differently: Take 50 mg by mouth daily. )    . atorvastatin (LIPITOR) 40 MG tablet Take 40 mg by mouth daily.    . fish oil-omega-3 fatty acids 1000 MG capsule Take 1 g by mouth daily.    . isosorbide mononitrate (IMDUR) 30 MG 24 hr tablet Take 3 tablets by mouth Daily.    Marland Kitchen LORazepam (ATIVAN) 0.5 MG tablet Take 1 tablet (0.5 mg total) by mouth every 8 (eight) hours as needed. 30 tablet 0  . metFORMIN (GLUCOPHAGE) 1000 MG tablet Take 1 tablet by mouth Twice daily.    . nitroGLYCERIN (NITROSTAT) 0.4 MG SL tablet Place 0.4 mg under the tongue every 5 (five) minutes as needed.    . quinapril (ACCUPRIL) 10 MG tablet Take 1 tablet by mouth Daily.    . ranitidine (ZANTAC) 150 MG tablet Take 1 tablet by mouth Twice daily.    . rosuvastatin (CRESTOR) 40 MG tablet Take 40 mg by mouth daily.    . Tamsulosin HCl (FLOMAX) 0.4 MG CAPS Take 1 tablet by mouth Daily.     No current facility-administered medications on file prior to visit.     Past Medical History  Diagnosis Date  . Hyperlipidemia   .  Hypertension   . Diabetes mellitus   . Thrombocytopathia   . Anxiety   . Coronary artery disease     Previous CABG. Most recent cath 01/2012: patent LIMA to LAD, patent stent in first diagonal with mild ISR, 90% ostial disease. Normal EF.  PCI and DES placement to ostial D1 extending into LAD.      Past Surgical History  Procedure Laterality Date  . Coronary artery bypass graft  2001    [facility]  . Cardiac catheterization    . Cardiac catheterization  08/2014     Family History  Problem Relation Age of Onset  . Diabetes      fx  . Cancer      mat. family  . Hypertension Mother   . Heart disease Mother      History   Social History  . Marital Status: Married    Spouse Name: N/A    Number of Children: N/A  . Years of Education: N/A   Occupational History  . Not on file.   Social History Main Topics  . Smoking status: Former Research scientist (life sciences)  . Smokeless tobacco: Never Used  . Alcohol Use: No  . Drug Use: No  . Sexual Activity: Not on file   Other Topics Concern  . Not on file   Social History Narrative  PHYSICAL EXAM   BP 110/70 mmHg  Pulse 55  Ht 5\' 7"  (1.702 m)  Wt 177 lb 12 oz (80.627 kg)  BMI 27.83 kg/m2 Constitutional: He is oriented to person, place, and time. He appears well-developed and well-nourished. No distress.  HENT: No nasal discharge.  Head: Normocephalic and atraumatic.  Eyes: Pupils are equal and round. Right eye exhibits no discharge. Left eye exhibits no discharge.  Neck: Normal range of motion. Neck supple. No JVD present. No thyromegaly present.  Cardiovascular: Normal rate, regular rhythm, normal heart sounds and. Exam reveals no gallop and no friction rub. No murmur heard.  Pulmonary/Chest: Effort normal and breath sounds normal. No stridor. No respiratory distress. He has no wheezes. He has no rales. He exhibits no tenderness.  Abdominal: Soft. Bowel sounds are normal. He exhibits no distension. There is no tenderness. There is no  rebound and no guarding.  Musculoskeletal: Normal range of motion. He exhibits no edema and no tenderness.  Neurological: He is alert and oriented to person, place, and time. Coordination normal.  Skin: Skin is warm and dry. No rash noted. He is not diaphoretic. No erythema. No pallor.  Psychiatric: He has a normal mood and affect. His behavior is normal. Judgment and thought content normal.  Left radial pulse is normal with no hematoma     EKG: Sinus  Bradycardia  Low voltage in limb leads.   ABNORMAL     ASSESSMENT AND PLAN

## 2014-09-24 ENCOUNTER — Encounter: Payer: Self-pay | Admitting: Cardiovascular Disease

## 2014-10-08 DIAGNOSIS — I213 ST elevation (STEMI) myocardial infarction of unspecified site: Secondary | ICD-10-CM | POA: Diagnosis not present

## 2014-10-08 DIAGNOSIS — C439 Malignant melanoma of skin, unspecified: Secondary | ICD-10-CM | POA: Diagnosis not present

## 2014-10-08 DIAGNOSIS — I25810 Atherosclerosis of coronary artery bypass graft(s) without angina pectoris: Secondary | ICD-10-CM | POA: Diagnosis not present

## 2014-10-08 DIAGNOSIS — D229 Melanocytic nevi, unspecified: Secondary | ICD-10-CM | POA: Diagnosis not present

## 2014-10-24 DIAGNOSIS — D487 Neoplasm of uncertain behavior of other specified sites: Secondary | ICD-10-CM | POA: Diagnosis not present

## 2014-11-05 DIAGNOSIS — C44311 Basal cell carcinoma of skin of nose: Secondary | ICD-10-CM | POA: Diagnosis not present

## 2014-11-05 DIAGNOSIS — R221 Localized swelling, mass and lump, neck: Secondary | ICD-10-CM | POA: Diagnosis not present

## 2014-11-20 DIAGNOSIS — C44321 Squamous cell carcinoma of skin of nose: Secondary | ICD-10-CM | POA: Diagnosis not present

## 2014-11-26 DIAGNOSIS — Z4802 Encounter for removal of sutures: Secondary | ICD-10-CM | POA: Diagnosis not present

## 2014-12-26 NOTE — H&P (Signed)
PATIENT NAME:  Scott Doyle, Scott Doyle MR#:  [id number] DATE OF BIRTH:  [birth year]  DATE OF ADMISSION:  06/27/2013  REFERRING PHYSICIAN: Dr. Marta Antu  PRIMARY CARE PHYSICIAN: Dr. Cletis Athens  PRIMARY CARDIOLOGIST: Dr. Kathlyn Sacramento.  CHIEF COMPLAINT: Chest pain.   HISTORY OF PRESENT ILLNESS: This is a 54 year old male with significant past medical  history of coronary artery disease status post CABG in 2011 with most recent cardiac catheter by Dr. Fletcher Anon in May 2013 with drug-eluting stent and a 90% stenosis in D1, history of diabetes, hypertension, hyperlipidemia, presents today with complaints of chest pain. The patient reports this chest pain has been going on for a few weeks, pressure-like quality, non radiating, accompanied by some nausea and dyspnea on exertion. No provoking factor, but relieved with nitroglycerin. Reports his chest pain has worsened over the last few days, which prompted him to come to the ED, the patient received nitro paste in the ED with resolution of his chest pain. As well, he was given 324 mg   of aspirin in the ED. The patient's first troponin was negative. His EKG did not show any significant ST or T wave changes. Hospitalist service was requested to admit the patient due to his significant cardiac history and due to the fact his chest pain resolved with nitro. The patient currently reports he is chest pain-free.   PAST MEDICAL HISTORY: 1. Coronary artery disease, status post CABG in 2012. Most recent cardiac stent in May 2013 by Dr. Fletcher Anon with stent placement in D1  2. Hypertension.  3. Hyperlipidemia.  4. Diabetes.  5. Bipolar disorder.   PAST SURGICAL HISTORY:  1. Stent placement.  2. CABG.   ALLERGIES: PENICILLIN.   HOME MEDICATIONS: 1. Aspirin 81 mg oral daily.  2. Atenolol 50 mg oral daily.  3. Crestor 40 mg oral at bedtime.  4. Flomax 0.4 mg oral daily.  5. Quinapril 10 mg oral daily.  6. Sublingual nitroglycerin as needed.  7. Ativan 0.5 mg as  needed.  8. Glipizide 5 mg oral daily.  9. Metformin 1000 mg oral 2 times a day.  10. Ranitidine 150 mg oral 2 times a day.  11. Isosorbide mononitrate 90 mg oral daily.   FAMILY HISTORY: Mother had coronary artery disease and CVA. Sister had coronary artery disease as well, sister had CVA.   SOCIAL HISTORY: The patient is married, lives with his wife. Quit smoking 2011. No alcohol or no drug abuse.   REVIEW OF SYSTEMS: The patient denies fever, chills, fatigue, weakness, weight gain, weight loss.  EYES: Denies blurry vision, double vision, inflammation, glaucoma.  ENT: Denies eye pain, hearing loss or epistaxis.  RESPIRATORY: Denies cough, wheezing, hemoptysis, painful respiratory or COPD.  CARDIOVASCULAR: Complains of chest pain, dyspnea on exertion. Denies any edema, palpitations or syncope.  GASTROINTESTINAL: Denies vomiting, diarrhea, abdominal pain, hematemesis, melena. Had episode of nausea.  GENITOURINARY: Denies dysuria, hematuria, renal colic.  ENDOCRINE: Denies polyuria, polydipsia, heat or cold intolerance.  HEMATOLOGY: Denies anemia, easy bruising, bleeding diathesis.  INTEGUMENT: Denies acne, rash or skin lesions.  MUSCULOSKELETAL: Denies neck pain, back pain, shoulder pain, arthritis or cramps.  NEUROLOGIC: Denies CVA, transient ischemic attack, dementia, headache, ataxia, vertigo.  PSYCHIATRIC: Denies insomnia, bipolar disorder, substance or alcohol abuse or schizophrenia.   PHYSICAL EXAMINATION: VITAL SIGNS: Temperature 98.1, pulse 60, respiratory rate 18, blood pressure 147/73, saturating 98% on room air.  IN GENERAL: Well-nourished male who is comfortable and in no apparent distress.  HEENT: Head is atraumatic, normocephalic.  Pupils equal, reactive to light. Pink conjunctivae. Anicteric sclerae. Moist oral mucosa.  NECK: Supple. No thyromegaly. No JVD.  CHEST: Good air entry bilaterally. No wheezing, rales, rhonchi.  CARDIOVASCULAR: S1, S2 heard. No rubs, murmurs, or  gallops.  ABDOMEN: Soft, nontender, nondistended. Bowel sounds present.  EXTREMITIES: No edema. No clubbing. No cyanosis.  PSYCHIATRIC: Appropriate affect. Awake, alert x 3. Intact judgment and insight.  NEUROLOGIC: Cranial nerves grossly intact. Motor 5/5. No focal deficits.  CARDIOVASCULAR: Has good dorsalis pedis pulse bilaterally.  LYMPHATIC: No cervical or supraclavicular lymphadenopathy.  SKIN: Normal skin turgor. Warm and dry. No rash.   PERTINENT LABORATORY DATA: Glucose 223, BUN 8, creatinine 1.06, sodium 137, potassium 3.8, chloride 105, CO2 25, troponin less than 0.02. White blood cell 5.5, hemoglobin 14.3, hematocrit 41.4, platelets 131. EKG showing normal sinus rhythm without significant ST or T wave changes at 64 beats per minute.   ASSESSMENT AND PLAN: 1. Chest pain, currently resolved, but given the patient's significant history of cardiac disease, as well, his chest pain resolved with nitroglycerin, he will be admitted to telemetry unit. He was given 324 mg of aspirin in the ED, received nitro paste. Will be kept on sublingual nitroglycerin. We will continue to cycle his cardiac enzymes and follow the trend. We will consult cardiology service to see if there is any further work-up is indicated at this point. The patient will be started on p.r.n. morphine, oxygen as well.  2. History of coronary artery disease. The patient is on aspirin, statin, quinapril and beta blockers.  3. Hypertension. Currently acceptable, resume home meds.  4. Hyperlipidemia. Continue with statin.  5. Diabetes mellitus. Hold oral hypoglycemic agents and continue his insulin standing scale.  6. Deep vein thrombosis prophylaxis. Subcutaneous heparin.  7. Gastrointestinal prophylaxis, on Ranitidine.   CODE STATUS: FULL CODE.   TOTAL TIME SPENT ON ADMISSION AND PATIENT CARE: 50 minutes.    ____________________________ Albertine Patricia, MD dse:sg D: 06/27/2013 05:15:30 ET T: 06/27/2013 07:11:09  ET JOB#: [id number]  cc: Albertine Patricia, MD, <Dictator> Javier Mamone Graciela Husbands MD ELECTRONICALLY SIGNED 06/28/2013 3:04

## 2014-12-26 NOTE — Consult Note (Signed)
General Aspect Scott Doyle is a 54 year old male with known history of CAD s/p 1 vessel CABG and PCI who presented with chest pain.  Most recent cardiac catheterization in May of 2103 showed patent LIMA to LAD and patent stents in the first diagonal with mild instent restenosis. However, there was a 90% ostial stenosis in the first diagonal. He underwent an angioplasty and drug-eluting stent placement to the ostial diagonal without complications He had chest yesterday described as tightness which lasted for about 30 minutes. He had mildler episodes recently which did not require NTG. These episodes are mostly nonexertional.   Physical Exam:  GEN no acute distress   NECK supple    RESP normal resp effort  clear BS  no use of accessory muscles    CARD Regular rate and rhythm  No murmur    ABD denies tenderness    EXTR negative cyanosis/clubbing, negative edema   PSYCH alert, A+O to time, place, person   Review of Systems:  Subjective/Chief Complaint Chest pain   General: Fatigue    Skin: No Complaints    ENT: No Complaints    Eyes: No Complaints    Neck: No Complaints    Respiratory: No Complaints    Cardiovascular: Chest pain or discomfort    Gastrointestinal: No Complaints    Genitourinary: No Complaints    Vascular: No Complaints    Musculoskeletal: No Complaints    Neurologic: No Complaints    Hematologic: No Complaints    Endocrine: No Complaints    Psychiatric: No Complaints    Review of Systems: All other systems were reviewed and found to be negative        Hyperlipidemia:    diabetes:    myocadrail infarction:    CARDIAC STENTS:    cabg:   Home Medications:  Medication Instructions Status  aspirin 81 mg oral tablet 1  orally once a day  Active  atenolol 50 mg oral tablet 1  orally once a day  Active  Crestor 40 mg oral tablet 1  orally once a day (at bedtime)  Active  tamsulosin 0.4 mg oral capsule 1  orally once a day  Active  glipiZIDE  5 mg oral tablet, extended release 1  orally once a day  Active  isosorbide 11m daily   once a day  Active  metformin 1000 mg oral tablet 1  orally 2 times a day  Active  nitroglycerin 0.4 mg sublingual tablet 1  sublingual  PRN Pain , as needed   Active  quinapril 10 mg oral tablet 1  orally once a day  Active  ranitidine 150 mg oral tablet 1  orally 2 times a day  Active  Ativan 0.5 mg oral tablet tab(s) orally , As Needed Active   Lab Results:  Routine Chem:  22-Oct-14 21:28   Glucose, Serum  223  BUN 8  Creatinine (comp) 1.06  Sodium, Serum 137  Potassium, Serum 3.8  Chloride, Serum 105  CO2, Serum 25  Calcium (Total), Serum 8.7  Anion Gap 7  Osmolality (calc) 279  eGFR (African American) >60  eGFR (Non-African American) >60 (eGFR values <666mmin/1.73 m2 may be an indication of chronic kidney disease (CKD). Calculated eGFR is useful in patients with stable renal function. The eGFR calculation will not be reliable in acutely ill patients when serum creatinine is changing rapidly. It is not useful in  patients on dialysis. The eGFR calculation may not be applicable to patients at the low and  high extremes of body sizes, pregnant women, and vegetarians.)  Cardiac:  22-Oct-14 09:28   CK, Total 70  CPK-MB, Serum 1.3 (Result(s) reported on 27 Jun 2013 at 08:20AM.)    21:28   Troponin I < 0.02 (0.00-0.05 0.05 ng/mL or less: NEGATIVE  Repeat testing in 3-6 hrs  if clinically indicated. >0.05 ng/mL: POTENTIAL  MYOCARDIAL INJURY. Repeat  testing in 3-6 hrs if  clinically indicated. NOTE: An increase or decrease  of 30% or more on serial  testing suggests a  clinically important change)  23-Oct-14 08:17   CK, Total 65  CPK-MB, Serum 1.2 (Result(s) reported on 27 Jun 2013 at 09:20AM.)  Troponin I < 0.02 (0.00-0.05 0.05 ng/mL or less: NEGATIVE  Repeat testing in 3-6 hrs  if clinically indicated. >0.05 ng/mL: POTENTIAL  MYOCARDIAL INJURY. Repeat  testing in 3-6 hrs  if  clinically indicated. NOTE: An increase or decrease  of 30% or more on serial  testing suggests a  clinically important change)  Routine Hem:  22-Oct-14 21:28   WBC (CBC) 5.5  RBC (CBC) 4.85  Hemoglobin (CBC) 14.3  Hematocrit (CBC) 41.4  Platelet Count (CBC)  131 (Result(s) reported on 26 Jun 2013 at 09:43PM.)  MCV 85  MCH 29.5  MCHC 34.6  RDW 13.6   EKG:  EKG Interp. by me  Nml  NSR    Radiology Results:  XRay:    22-Oct-14 21:45, Chest PA and Lateral  Chest PA and Lateral   REASON FOR EXAM:    Chest Pain  COMMENTS:       PROCEDURE: DXR - DXR CHEST PA (OR AP) AND LATERAL  - Jun 26 2013  9:45PM     RESULT: Comparison is made to study of November 25, 2011.    The lungs are adequately inflated. There is no focal infiltrate. The   cardiac silhouette is top normal in size. The pulmonary vascularity is   not engorged. The perihilar lung markings are coarse. The patient has   undergone previous CABG. There is no pleural effusion or pneumothorax.   The observed portions of the bony thorax appear normal.    IMPRESSION:  There is no evidence of pneumonia nor CHF. Coarse perihilar   lung markings are nonspecific and may be normal for the patient although     perihilar subsegmental atelectasis cannot be absolutely excluded.   Followup films or CT scanning are recommended if the patient's symptoms   persist and remain unexplained.     Dictation Site: 2        Verified By: DAVID A. Martinique, M.D., MD    PCN: Hives  Ampicillin: Hives  Amoxicillin: Hives  Vital Signs/Nurse's Notes:  **Vital Signs.:   23-Oct-14 05:47  Vital Signs Type Admission  Temperature Temperature (F) 97.9  Celsius 36.6  Temperature Source oral  Pulse Pulse 55  Respirations Respirations 15  Systolic BP Systolic BP 756  Diastolic BP (mmHg) Diastolic BP (mmHg) 82  Mean BP 106  Pulse Ox % Pulse Ox % 99  Pulse Ox Heart Rate 56    08:00  Temperature Temperature (F) 97.6  Celsius 36.4   Temperature Source oral  Pulse Pulse 64  Respirations Respirations 16  Systolic BP Systolic BP 433  Diastolic BP (mmHg) Diastolic BP (mmHg) 82  Mean BP 100  Pulse Ox % Pulse Ox % 100  Pulse Ox Activity Level  At rest  Oxygen Delivery Room Air/ 21 %    Impression 1. Atypical chest pain with known  history of CAD s/p 1 vesel CABG and diagonal PCI: No acute changes on EKG.  Negative cardiac enzymes.  Recommend a nuclear stress test today.  Continue current cardiac medications.   2. Hypertension: BP is well controlled.   3. Hyperlipidnemia: continue high dose Crestor.   Electronic Signatures: Kathlyn Sacramento (MD)  (Signed 23-Oct-14 10:33)  Authored: General Aspect/Present Illness, History and Physical Exam, Review of System, Past Medical History, Home Medications, Labs, EKG , Radiology, Allergies, Vital Signs/Nurse's Notes, Impression/Plan   Last Updated: 23-Oct-14 10:33 by Kathlyn Sacramento (MD)

## 2014-12-26 NOTE — Discharge Summary (Signed)
PATIENT NAME:  Scott Doyle, Scott Doyle MR#:  [id number] DATE OF BIRTH:  06/24/1961  DATE OF ADMISSION:  06/27/2013 DATE OF DISCHARGE:  06/27/2013  PRESENTING COMPLAINT: Chest pain.   DISCHARGE DIAGNOSES: 1.  Unstable angina in the setting of coronary artery disease status post coronary artery bypass graft and stent in the past.  2.  Hyperlipidemia.  3.  Hypertension.  4.  Type 2 diabetes.   PROCEDURES: Myoview stress test: No acute ischemic changes.   CODE STATUS: FULL CODE.   MEDICATIONS: 1.  Aspirin 81 mg daily.  2.  Atenolol 50 mg daily.  3.  Crestor 40 mg at bedtime.  4.  Tamsulosin 0.4 mg p.o. daily.  5.  Glipizide 5 mg p.o. daily.  6.  Isosorbide 90 mg p.o. daily.  7.  Metformin 1000 mg b.i.d.  8.  Nitroglycerin 0.4 mg sublingual p.r.n. as needed.  9.  Quinapril 10 mg daily.  10.  Ranitidine 150 mg b.i.d.  11.  Ativan 0.5 mg p.o. daily as needed.   DISCHARGE INSTRUCTIONS: 1.  Follow up with Dr. Fletcher Anon in 2 to 4 weeks.  2.  Follow up with Dr. Lavera Guise in 1 to 2 weeks.  LABORATORY AND DIAGNOSTICS: Cardiac enzymes x 3 negative.   Myoview stress test essentially negative for any ischemia. EF is around 49%.  BRIEF SUMMARY OF HOSPITAL COURSE: [name] is a pleasant 54 year old Caucasian gentleman with strong history of coronary artery disease status post CABG and stent in the past comes in with:  1.  Chest pain/unstable angina. He was admitted in the CCU, seen by Dr. Fletcher Anon who recommended to get stress test. His first 2 cardiac enzymes were negative. The patient was continued on his aspirin, nitro and cardiac meds. His stress test was without any ischemic changes. The patient was chest pain-free and was discharged to home with follow up with Dr. Fletcher Anon as outpatient.  2.  CAD. On aspirin, statin, quinapril and beta blockers.  3.  Hypertension. Resumed home meds.  4.  Hyperlipidemia. Continued statins.  5. Diabetes. Resumed his p.o. diabetic med, which is metformin.  6.  GERD. Continued  ranitidine.   Hospital stay otherwise remained stable. The patient remained a FULL CODE.   TIME SPENT: 40 minutes.  ____________________________ Hart Rochester Posey Pronto, MD sap:sb D: 06/28/2013 15:10:53 ET T: 06/28/2013 16:43:25 ET JOB#: [id number]  cc: Danyell Shader A. Posey Pronto, MD, <Dictator> Muhammad A. Fletcher Anon, MD Cletis Athens, MD Ilda Basset MD ELECTRONICALLY SIGNED 06/30/2013 10:57

## 2014-12-28 NOTE — Consult Note (Signed)
Chief Complaint:   Subjective/Chief Complaint No complaints, doing well this morning.  Ready to go home.   VITAL SIGNS/ANCILLARY NOTES: **Vital Signs.:   11-May-13 06:48   Vital Signs Type Routine   Temperature Temperature (F) 98.4   Celsius 36.8   Temperature Source oral   Pulse Pulse 56   Respirations Respirations 19   Systolic BP Systolic BP 631   Diastolic BP (mmHg) Diastolic BP (mmHg) 65   Mean BP 89   Pulse Ox % Pulse Ox % 97   Pulse Ox Heart Rate 56  *Intake and Output.:   Daily 11-May-13 07:00   Grand Totals Intake:  1820 Output:  4100    Net:  -2280 24 Hr.:  -2280   Oral Intake      In:  720   IV (Primary)      In:  1100   Urine ml     Out:  4100   Length of Stay Totals Intake:  1820 Output:  4100    Net:  -2280   Brief Assessment:   Cardiac Regular  no murmur  -- LE edema  -- JVD  --Gallop    Respiratory normal resp effort  clear BS    Gastrointestinal details normal Soft  Nontender  Nondistended    Additional Physical Exam Left radial cath site benign with normal pulse. Right femoral cath site benign with no hematoma.   Electronic Signatures for Addendum Section:  Larey Dresser (MD) (Signed Addendum 11-May-13 08:30)  Impression:54 yo with history of CAD s/CABG had LHC yesterday with PCI to severe stenosis of first diagonal.  No problems overnight, cath sites (left radial, right femoral) benign and he is ready for discharge.   Plan: Discharge today.  Will need Plavix for at least a year.  Continue other home medications.  Will followup with Dr. Fletcher Anon next week.   Electronic Signatures: Larey Dresser (MD)  (Signed 11-May-13 08:29)  Authored: Chief Complaint, VITAL SIGNS/ANCILLARY NOTES, Brief Assessment   Last Updated: 11-May-13 08:30 by Larey Dresser (MD)

## 2014-12-28 NOTE — Discharge Summary (Signed)
PATIENT NAME:  Scott Doyle, Scott Doyle MR#:  [id number] DATE OF BIRTH:  [birth year]  DATE OF ADMISSION:  11/25/2011 DATE OF DISCHARGE:  11/25/2011  DISCHARGE DIAGNOSES:  1. Atypical chest pain, likely noncardiac, unclear etiology.  2. Diabetes.  3. Hypertension. 4. History of coronary artery disease, status post coronary artery bypass graft and stents. Thrombocytopenia.  5. Hyperlipidemia.  6. Bipolar disorder.   DISPOSITION: The patient is being discharged home.   FOLLOWUP: Follow up with primary care physician, Dr. Jimmye Norman, at [facility] in 1 to 2 weeks after discharge.   DIET: Low sodium, 1800-calorie ADA diet.   ACTIVITY: As tolerated.   DISCHARGE MEDICATIONS:  1. Aspirin 81 mg daily.  2. Atenolol 50 mg daily.  3. Crestor 40 mg daily.  4. Colace 100 mg once a day as needed.  5. Flomax 0.5 mg daily.  6. Glipizide 5 mg daily.  7. Isosorbide 90 mg daily.  8. Metformin 1000 mg b.i.d.  9. Nitroglycerin 0.4 mg sublingual p.r.n.  10. Quinapril 10 mg daily.  11. Zantac 150 mg b.i.d.   RESULTS: Inpatient nuclear stress test showed no evidence of any ischemia. Ejection fraction 76%. Chest x-ray showed no acute cardiopulmonary pathology. EKG showed normal sinus rhythm, nonspecific intraventricular conduction delay. CBC normal other platelet? count of 134. Glucose 121. The rest of the basic metabolic panel is normal. Magnesium 1.7. Hemoglobin A1c 6.8.  HDL 29. LDL 51. Cardiac enzymes negative.   HOSPITAL COURSE: The patient is a 54 year old male with past medical history of diabetes, hypertension, hyperlipidemia, coronary artery disease status post coronary artery bypass graft and stent, who presented with atypical chest pain and palpitations. The patient was admitted to the hospital. His EKG and cardiac enzymes were normal and he ruled out for acute coronary syndrome. He underwent an inpatient stress test which showed no evidence of any ischemia. The patient is being discharged home in stable  condition. He will follow up with his primary care physician 1 to 2 weeks after discharge.   TIME SPENT: 45 minutes.   ____________________________ Cherre Huger, MD sp:bjt D: 11/25/2011 15:24:56 ET T: 11/25/2011 16:45:06 ET JOB#: [id number]  cc: Cherre Huger, MD, <Dictator> [facility]- Dr. Genene Churn MD ELECTRONICALLY SIGNED 11/26/2011 15:11

## 2014-12-28 NOTE — H&P (Signed)
PATIENT NAME:  Scott Doyle, Scott Doyle MR#:  [id number] DATE OF BIRTH:  Jan 17, 1961  DATE OF ADMISSION:  11/25/2011  REFERRING PHYSICIAN: ER physician, Dr. Owens Shark   PRIMARY CARE PHYSICIAN: Dr. Jimmye Norman at [place]: The patient has not established himself with any cardiologist.  PRESENTING SYMPTOMS: Palpitations, sharp retrosternal chest pain which started at 2 a.m.   HISTORY OF PRESENT ILLNESS: The patient is a 54 year old male with past medical history of coronary artery disease status post coronary artery bypass graft in 2001, status post 2 to 3 stents, hypertension, hyperlipidemia, diabetes, bipolar disorder, and obesity who was last admitted to [facility] in May of 2012 for chest pain. He underwent a nuclear stress test by Dr. Clayborn Bigness at that time which was negative. The patient reports that he has chronic intermittent angina which has responded to nitroglycerin and more or less he has been stable since 2011. Therefore, he hasn't established himself with any cardiologist. The patient reports that he was doing well until he woke up around 2 a.m. with sudden onset of palpitations and retrosternal sharp chest pain which lasted from 2 to 3:30. He also had a feeling of nausea, flushing, and diaphoresis. Therefore, he called EMS who did a 12-lead EKG on him which was nondiagnostic. However, given his risk factors he decided to come to the Emergency Room. Currently the patient is symptom free.   PAST MEDICAL HISTORY:  1. Coronary artery disease status post coronary artery bypass graft in 2011 followed by 2 to 3 stents. The patient reports that his last catheterization was done by Dr. Fletcher Anon several years ago. 2. Hypertension. 3. Hyperlipidemia. 4. Diabetes. 5. Bipolar disorder.  6. Obesity.   PAST SURGICAL HISTORY:  1. Stent placement. 2. Coronary artery bypass graft.   ALLERGIES: The patient is allergic to all penicillins including amoxicillin and penicillin.   CURRENT MEDICATIONS:   1. Aspirin 81 mg daily.  2. Atenolol 50 mg daily.  3. Crestor 40 mg daily.  4. Colace 100 mg daily as needed.  5. Flomax 0.4 mg daily.  6. Glipizide ER 5 mg daily.  7. Imdur 90 mg daily.  8. Metformin 1000 mg b.i.d. 9. Nitroglycerin p.r.n.  10. Quinapril 10 mg daily.  11. Zantac 150 mg b.i.d.   FAMILY HISTORY: Mother had heart disease and CVA. Sister had coronary artery disease. Sister had CVA. The patient does not know much about his biological father.   SOCIAL HISTORY: He is married and lives with his wife. Is not working currently. He quit smoking in 2011. Denies any alcohol or drug abuse.   REVIEW OF SYSTEMS: CONSTITUTIONAL: Denies any fever or fatigue. EYES: Denies any blurred or double vision. ENT: Denies any tinnitus or ear pain. RESPIRATORY: Denies any cough, wheezing. CARDIOVASCULAR: Denies any chest pain, palpitations. GI: Denies any diarrhea or abdominal pain. GU: Denies any dysuria or hematuria. ENDOCRINE: Denies any polyuria or nocturia. HEME/LYMPH: Denies any anemia, easy bruisability. INTEGUMENTARY: Denies any acne or rash. MUSCULOSKELETAL: Denies any joint pain. NEUROLOGICAL: Denies any numbness or weakness. PSYCH: Denies any insomnia. Has history of bipolar disorder.   PHYSICAL EXAMINATION:   VITAL SIGNS: Temperature 97.5, heart rate 84, respiratory rate 19, blood pressure 174/88, pulse oximetry 99% on room air.   GENERAL: The patient is a 54 year old Caucasian male, overweight, sitting comfortably in bed not in acute distress.   HEAD: Atraumatic, normocephalic.   EYES: There is no pallor, icterus, or cyanosis. Pupils equal, round, and reactive to light and accommodation. Extraocular movements intact.  ENT: Wet mucous membranes. No oropharyngeal erythema or thrush.   NECK: Supple. No masses. No JVD. No thyromegaly or lymphadenopathy.   CHEST WALL: No tenderness to palpation. Not using accessory muscles of respiration. No intercostal muscle retraction.   LUNGS:  Bilaterally clear to auscultation. No wheezing, rales, or rhonchi.   CARDIOVASCULAR: S1, S2 regular. No murmur, rubs, or gallop.   ABDOMEN: Soft, nontender, nondistended. No guarding. No rigidity. No organomegaly. Normal bowel sounds.   SKIN: No rashes or lesions.  PERIPHERIES: No pedal edema. 2+ pedal pulses.   MUSCULOSKELETAL: No cyanosis or clubbing.   NEUROLOGIC: Awake, alert, and oriented x3. Nonfocal neurological exam. Cranial nerves grossly intact.   PSYCH: Normal mood and affect.   LABORATORY, DIAGNOSTIC, AND RADIOLOGICAL DATA: EKG shows no evidence of acute ischemic changes. Cardiac enzymes negative. CBC normal other than platelet count of 114, glucose 172, sodium 146. Rest of CMP normal. AST 47.   ASSESSMENT AND PLAN: This is a 54 year old male with past medical history of coronary artery disease, hypertension, hyperlipidemia, and diabetes who presents with atypical retrosternal sharp chest pain associated with palpitations.  1. Chest pain/palpitations. Will admit the patient to a telemetry bed. Will continue the patient on aspirin, beta-blocker, ACE inhibitor, statin, and p.r.n. nitroglycerin. Will check serial cardiac enzymes and obtain an inpatient nuclear stress test given the patient's risk factors. 2. Accelerated hypertension. The patient's blood pressure was 174/88 possibly due to acute stress. Will continue current medications for the time being and adjust as needed to achieve good hypertensive control.  3. Diabetes, appears to be well controlled. Will check a hemoglobin A1c. Place on insulin sliding scale and glipizide. Will hold metformin in case the patient needs a catheterization.  4. Thrombocytopenia. This appears to be chronic. It was present in May 2012 also, possibly related to aspirin use. 5. Mild nonspecific elevation of AST at 47. The patient denies any abdominal complaints. Should continue to monitor as an outpatient since the patient is on statin therapy.   6. Hyperlipidemia. Will check a fasting lipid panel and continue Crestor.  7. History of bipolar disorder, currently not on any medications.  8. Will place on GI and DVT prophylaxis.    Reviewed old medical records, discussed with the patient and his wife.  TIME SPENT ON PLAN OF CARE AND MANAGEMENT: 75 minutes.   ____________________________ Cherre Huger, MD sp:drc D: 11/25/2011 07:42:22 ET T: 11/25/2011 08:21:41 ET JOB#: [id number]  cc: Cherre Huger, MD, <Dictator> [facility] MD ELECTRONICALLY SIGNED 11/25/2011 13:43

## 2014-12-31 DIAGNOSIS — I10 Essential (primary) hypertension: Secondary | ICD-10-CM | POA: Diagnosis not present

## 2014-12-31 DIAGNOSIS — R5381 Other malaise: Secondary | ICD-10-CM | POA: Diagnosis not present

## 2014-12-31 DIAGNOSIS — Z125 Encounter for screening for malignant neoplasm of prostate: Secondary | ICD-10-CM | POA: Diagnosis not present

## 2014-12-31 DIAGNOSIS — E119 Type 2 diabetes mellitus without complications: Secondary | ICD-10-CM | POA: Diagnosis not present

## 2014-12-31 DIAGNOSIS — E784 Other hyperlipidemia: Secondary | ICD-10-CM | POA: Diagnosis not present

## 2015-01-06 DIAGNOSIS — I2510 Atherosclerotic heart disease of native coronary artery without angina pectoris: Secondary | ICD-10-CM | POA: Diagnosis not present

## 2015-01-06 DIAGNOSIS — E119 Type 2 diabetes mellitus without complications: Secondary | ICD-10-CM | POA: Diagnosis not present

## 2015-01-06 DIAGNOSIS — I25810 Atherosclerosis of coronary artery bypass graft(s) without angina pectoris: Secondary | ICD-10-CM | POA: Diagnosis not present

## 2015-01-06 DIAGNOSIS — E785 Hyperlipidemia, unspecified: Secondary | ICD-10-CM | POA: Diagnosis not present

## 2015-03-04 DIAGNOSIS — E119 Type 2 diabetes mellitus without complications: Secondary | ICD-10-CM | POA: Diagnosis not present

## 2015-03-04 DIAGNOSIS — I25810 Atherosclerosis of coronary artery bypass graft(s) without angina pectoris: Secondary | ICD-10-CM | POA: Diagnosis not present

## 2015-03-06 DIAGNOSIS — E785 Hyperlipidemia, unspecified: Secondary | ICD-10-CM | POA: Diagnosis not present

## 2015-03-06 DIAGNOSIS — I213 ST elevation (STEMI) myocardial infarction of unspecified site: Secondary | ICD-10-CM | POA: Diagnosis not present

## 2015-03-06 DIAGNOSIS — E119 Type 2 diabetes mellitus without complications: Secondary | ICD-10-CM | POA: Diagnosis not present

## 2015-03-06 DIAGNOSIS — I25810 Atherosclerosis of coronary artery bypass graft(s) without angina pectoris: Secondary | ICD-10-CM | POA: Diagnosis not present

## 2015-05-21 ENCOUNTER — Ambulatory Visit (INDEPENDENT_AMBULATORY_CARE_PROVIDER_SITE_OTHER): Payer: Medicare Other | Admitting: Cardiovascular Disease

## 2015-05-21 ENCOUNTER — Encounter: Payer: Self-pay | Admitting: Cardiovascular Disease

## 2015-05-21 VITALS — BP 138/80 | HR 60 | Ht 66.0 in | Wt 174.8 lb

## 2015-05-21 DIAGNOSIS — R531 Weakness: Secondary | ICD-10-CM | POA: Diagnosis not present

## 2015-05-21 DIAGNOSIS — I2510 Atherosclerotic heart disease of native coronary artery without angina pectoris: Secondary | ICD-10-CM

## 2015-05-21 DIAGNOSIS — R079 Chest pain, unspecified: Secondary | ICD-10-CM | POA: Diagnosis not present

## 2015-05-21 DIAGNOSIS — E785 Hyperlipidemia, unspecified: Secondary | ICD-10-CM

## 2015-05-21 DIAGNOSIS — I10 Essential (primary) hypertension: Secondary | ICD-10-CM | POA: Diagnosis not present

## 2015-05-21 NOTE — Progress Notes (Signed)
HPI  Scott Doyle is a 54 year old male who is here today for a followup visit regarding coronary artery disease. He is status post CABG and PCI of diagonal. He has other medical problems that include hypertension, hyperlipidemia and type 2 diabetes. Most recent cardiac catheterization in December 2015 showed patent LIMA to LAD and patent stents in the diagonal. There was no evidence of obstructive coronary artery disease. He has known history of chronic atypical chest pain. He had an episode yesterday of sudden chest pain that lasted 10 seconds. He continues to walk for exercise with no difficulty and exertional symptoms.  Allergies  Allergen Reactions  . Ampicillin Hives  . Penicillins Hives and Swelling     Current Outpatient Prescriptions on File Prior to Visit  Medication Sig Dispense Refill  . aspirin 81 MG tablet Take 81 mg by mouth daily.    Marland Kitchen atorvastatin (LIPITOR) 40 MG tablet Take 40 mg by mouth daily.    . fish oil-omega-3 fatty acids 1000 MG capsule Take 1 g by mouth daily.    Marland Kitchen glipiZIDE (GLUCOTROL XL) 2.5 MG 24 hr tablet Take 2.5 mg by mouth daily.  3  . isosorbide mononitrate (IMDUR) 30 MG 24 hr tablet Take 3 tablets by mouth Daily.    Marland Kitchen LORazepam (ATIVAN) 0.5 MG tablet Take 1 tablet (0.5 mg total) by mouth every 8 (eight) hours as needed. 30 tablet 0  . metFORMIN (GLUCOPHAGE) 1000 MG tablet Take 1 tablet by mouth Twice daily.    . nitroGLYCERIN (NITROSTAT) 0.4 MG SL tablet Place 0.4 mg under the tongue every 5 (five) minutes as needed.    . quinapril (ACCUPRIL) 10 MG tablet Take 1 tablet by mouth Daily.    . ranitidine (ZANTAC) 150 MG tablet Take 1 tablet by mouth Twice daily.    . Tamsulosin HCl (FLOMAX) 0.4 MG CAPS Take 1 tablet by mouth Daily.     No current facility-administered medications on file prior to visit.     Past Medical History  Diagnosis Date  . Hyperlipidemia   . Hypertension   . Diabetes mellitus   . Thrombocytopathia   . Anxiety   . Coronary  artery disease     Previous CABG. Most recent cath 01/2012: patent LIMA to LAD, patent stent in first diagonal with mild ISR, 90% ostial disease. Normal EF.  PCI and DES placement to ostial D1 extending into LAD.      Past Surgical History  Procedure Laterality Date  . Coronary artery bypass graft  2001    [facility]  . Cardiac catheterization    . Cardiac catheterization  08/2014    Patent LIMA to LAD and patent diagonal stents.     Family History  Problem Relation Age of Onset  . Diabetes      fx  . Cancer      mat. family  . Hypertension Mother   . Heart disease Mother      Social History   Social History  . Marital Status: Married    Spouse Name: N/A  . Number of Children: N/A  . Years of Education: N/A   Occupational History  . Not on file.   Social History Main Topics  . Smoking status: Former Research scientist (life sciences)  . Smokeless tobacco: Never Used  . Alcohol Use: No  . Drug Use: No  . Sexual Activity: Not on file   Other Topics Concern  . Not on file   Social History Narrative  PHYSICAL EXAM   BP 138/80 mmHg  Pulse 60  Ht 5\' 6"  (1.676 m)  Wt 174 lb 12 oz (79.266 kg)  BMI 28.22 kg/m2 Constitutional: He is oriented to person, place, and time. He appears well-developed and well-nourished. No distress.  HENT: No nasal discharge.  Head: Normocephalic and atraumatic.  Eyes: Pupils are equal and round. Right eye exhibits no discharge. Left eye exhibits no discharge.  Neck: Normal range of motion. Neck supple. No JVD present. No thyromegaly present.  Cardiovascular: Normal rate, regular rhythm, normal heart sounds and. Exam reveals no gallop and no friction rub. No murmur heard.  Pulmonary/Chest: Effort normal and breath sounds normal. No stridor. No respiratory distress. He has no wheezes. He has no rales. He exhibits no tenderness.  Abdominal: Soft. Bowel sounds are normal. He exhibits no distension. There is no tenderness. There is no rebound and no guarding.    Musculoskeletal: Normal range of motion. He exhibits no edema and no tenderness.  Neurological: He is alert and oriented to person, place, and time. Coordination normal.  Skin: Skin is warm and dry. No rash noted. He is not diaphoretic. No erythema. No pallor.  Psychiatric: He has a normal mood and affect. His behavior is normal. Judgment and thought content normal.  Left radial pulse is normal with no hematoma     EKG:  Normal sinus rhythm with no significant ST or T wave changes.   ASSESSMENT AND PLAN

## 2015-05-21 NOTE — Patient Instructions (Signed)
Medication Instructions: Continue same medications.   Labwork: None.   Procedures/Testing: None.   Follow-Up: 6 months with Dr. Angel Weedon.   Any Additional Special Instructions Will Be Listed Below (If Applicable).   

## 2015-05-21 NOTE — Assessment & Plan Note (Addendum)
He is doing reasonably well overall. His chest pain is atypical and seems to be musculoskeletal. His resting EKG is normal. Cardiac catheterization in December 2015 was overall unremarkable. I recommend continuing medical therapy.

## 2015-05-21 NOTE — Assessment & Plan Note (Signed)
Continue treatment with atorvastatin with a target LDL of less than 70. 

## 2015-05-21 NOTE — Assessment & Plan Note (Signed)
Blood pressure is controlled on current medications. 

## 2015-05-26 DIAGNOSIS — I10 Essential (primary) hypertension: Secondary | ICD-10-CM | POA: Diagnosis not present

## 2015-05-26 DIAGNOSIS — Z125 Encounter for screening for malignant neoplasm of prostate: Secondary | ICD-10-CM | POA: Diagnosis not present

## 2015-05-26 DIAGNOSIS — E784 Other hyperlipidemia: Secondary | ICD-10-CM | POA: Diagnosis not present

## 2015-05-26 DIAGNOSIS — Z0000 Encounter for general adult medical examination without abnormal findings: Secondary | ICD-10-CM | POA: Diagnosis not present

## 2015-05-26 DIAGNOSIS — E119 Type 2 diabetes mellitus without complications: Secondary | ICD-10-CM | POA: Diagnosis not present

## 2015-05-28 ENCOUNTER — Other Ambulatory Visit: Payer: Self-pay | Admitting: Cardiology

## 2015-05-28 DIAGNOSIS — R160 Hepatomegaly, not elsewhere classified: Secondary | ICD-10-CM

## 2015-05-29 DIAGNOSIS — Z23 Encounter for immunization: Secondary | ICD-10-CM | POA: Diagnosis not present

## 2015-06-03 ENCOUNTER — Ambulatory Visit
Admission: RE | Admit: 2015-06-03 | Discharge: 2015-06-03 | Disposition: A | Discharge status: Home / Self Care | Payer: Medicare Other | Source: Ambulatory Visit | Attending: Cardiology | Admitting: Cardiology

## 2015-06-03 DIAGNOSIS — R160 Hepatomegaly, not elsewhere classified: Secondary | ICD-10-CM | POA: Diagnosis not present

## 2015-06-03 DIAGNOSIS — K760 Fatty (change of) liver, not elsewhere classified: Secondary | ICD-10-CM | POA: Diagnosis not present

## 2015-06-03 DIAGNOSIS — R10811 Right upper quadrant abdominal tenderness: Secondary | ICD-10-CM | POA: Diagnosis not present

## 2015-09-25 DIAGNOSIS — E785 Hyperlipidemia, unspecified: Secondary | ICD-10-CM | POA: Diagnosis not present

## 2015-09-25 DIAGNOSIS — R51 Headache: Secondary | ICD-10-CM | POA: Diagnosis not present

## 2015-09-25 DIAGNOSIS — J019 Acute sinusitis, unspecified: Secondary | ICD-10-CM | POA: Diagnosis not present

## 2015-09-25 DIAGNOSIS — E119 Type 2 diabetes mellitus without complications: Secondary | ICD-10-CM | POA: Diagnosis not present

## 2015-11-03 ENCOUNTER — Other Ambulatory Visit: Payer: Self-pay | Admitting: Cardiovascular Disease

## 2015-11-06 DIAGNOSIS — J069 Acute upper respiratory infection, unspecified: Secondary | ICD-10-CM | POA: Diagnosis not present

## 2015-11-06 DIAGNOSIS — I25810 Atherosclerosis of coronary artery bypass graft(s) without angina pectoris: Secondary | ICD-10-CM | POA: Diagnosis not present

## 2015-11-06 DIAGNOSIS — J209 Acute bronchitis, unspecified: Secondary | ICD-10-CM | POA: Diagnosis not present

## 2015-11-06 DIAGNOSIS — R6889 Other general symptoms and signs: Secondary | ICD-10-CM | POA: Diagnosis not present

## 2015-12-18 ENCOUNTER — Encounter: Payer: Self-pay | Admitting: Cardiovascular Disease

## 2015-12-18 ENCOUNTER — Ambulatory Visit (INDEPENDENT_AMBULATORY_CARE_PROVIDER_SITE_OTHER): Payer: Medicare Other | Admitting: Cardiovascular Disease

## 2015-12-18 VITALS — BP 140/70 | HR 63 | Ht 66.0 in | Wt 175.2 lb

## 2015-12-18 DIAGNOSIS — I10 Essential (primary) hypertension: Secondary | ICD-10-CM | POA: Diagnosis not present

## 2015-12-18 DIAGNOSIS — I2510 Atherosclerotic heart disease of native coronary artery without angina pectoris: Secondary | ICD-10-CM | POA: Diagnosis not present

## 2015-12-18 NOTE — Patient Instructions (Signed)
Medication Instructions: Continue same medications.   Labwork: None.   Procedures/Testing: None.   Follow-Up: 1 year with Dr. Revella Shelton.   Any Additional Special Instructions Will Be Listed Below (If Applicable).     If you need a refill on your cardiac medications before your next appointment, please call your pharmacy.   

## 2015-12-18 NOTE — Progress Notes (Signed)
Cardiology Office Note   Date:  12/18/2015   ID:  Scott Doyle, DOB November 19, 1960, MRN [id number]  PCP:  Cletis Athens, MD  Cardiologist:   Kathlyn Sacramento, MD   Chief Complaint  Patient presents with  . other    6 month f/u. Meds reviewed verbally with pt.      History of Present Illness: Scott Doyle is a 55 y.o. male who presents for  followup visit regarding coronary artery disease. He is status post CABG and PCI of diagonal. He has other medical problems that include hypertension, hyperlipidemia and type 2 diabetes. Most recent cardiac catheterization in December 2015 showed patent LIMA to LAD and patent stents in the diagonal. There was no evidence of obstructive coronary artery disease. He has known history of chronic atypical chest pain But overall he has been doing well lately with no significant symptoms. He has been taking his medications regularly. No significant shortness of breath.  Past Medical History  Diagnosis Date  . Hyperlipidemia   . Hypertension   . Diabetes mellitus   . Thrombocytopathia ([place])   . Anxiety   . Coronary artery disease     Previous CABG. Most recent cath 01/2012: patent LIMA to LAD, patent stent in first diagonal with mild ISR, 90% ostial disease. Normal EF.  PCI and DES placement to ostial D1 extending into LAD.     Past Surgical History  Procedure Laterality Date  . Coronary artery bypass graft  2001    [facility]  . Cardiac catheterization    . Cardiac catheterization  08/2014    Patent LIMA to LAD and patent diagonal stents.     Current Outpatient Prescriptions  Medication Sig Dispense Refill  . aspirin 81 MG tablet Take 81 mg by mouth daily.    Marland Kitchen atenolol (TENORMIN) 50 MG tablet Take 25 mg by mouth daily.    Marland Kitchen atorvastatin (LIPITOR) 40 MG tablet Take 40 mg by mouth daily.    . fish oil-omega-3 fatty acids 1000 MG capsule Take 1 g by mouth daily.    Marland Kitchen glipiZIDE (GLUCOTROL XL) 2.5 MG 24 hr tablet Take 2.5 mg by mouth daily.  3  .  isosorbide mononitrate (IMDUR) 30 MG 24 hr tablet Take 3 tablets by mouth Daily.    Marland Kitchen LORazepam (ATIVAN) 0.5 MG tablet Take 1 tablet (0.5 mg total) by mouth every 8 (eight) hours as needed. 30 tablet 0  . metFORMIN (GLUCOPHAGE) 1000 MG tablet Take 1 tablet by mouth Twice daily.    . nitroGLYCERIN (NITROSTAT) 0.4 MG SL tablet Place 0.4 mg under the tongue every 5 (five) minutes as needed.    . quinapril (ACCUPRIL) 10 MG tablet Take 1 tablet by mouth Daily.    . ranitidine (ZANTAC) 150 MG tablet Take 1 tablet by mouth Twice daily.    . Tamsulosin HCl (FLOMAX) 0.4 MG CAPS Take 1 tablet by mouth Daily.     No current facility-administered medications for this visit.    Allergies:   Ampicillin and Penicillins    Social History:  The patient  reports that he has quit smoking. He has never used smokeless tobacco. He reports that he does not drink alcohol or use illicit drugs.   Family History:  The patient's family history includes Heart disease in his mother; Hypertension in his mother.    ROS:  Please see the history of present illness.   Otherwise, review of systems are positive for none.   All other systems are reviewed  and negative.    PHYSICAL EXAM: VS:  BP 140/70 mmHg  Pulse 63  Ht 5\' 6"  (1.676 m)  Wt 175 lb 4 oz (79.493 kg)  BMI 28.30 kg/m2 , BMI Body mass index is 28.3 kg/(m^2). GEN: Well nourished, well developed, in no acute distress HEENT: normal Neck: no JVD, carotid bruits, or masses Cardiac: RRR; no rubs, or gallops,no edema . There is one out of 6 systolic ejection murmur in the aortic area Respiratory:  clear to auscultation bilaterally, normal work of breathing GI: soft, nontender, nondistended, + BS MS: no deformity or atrophy Skin: warm and dry, no rash Neuro:  Strength and sensation are intact Psych: euthymic mood, full affect   EKG:  EKG is ordered today. The ekg ordered today demonstrates normal sinus rhythm with no significant ST or T wave  changes.   Recent Labs: No results found for requested labs within last 365 days.    Lipid Panel    Component Value Date/Time   CHOL 95 11/25/2011 1201   TRIG 76 11/25/2011 1201   HDL 29* 11/25/2011 1201   VLDL 15 11/25/2011 1201   LDLCALC 51 11/25/2011 1201      Wt Readings from Last 3 Encounters:  12/18/15 175 lb 4 oz (79.493 kg)  05/21/15 174 lb 12 oz (79.266 kg)  08/22/14 177 lb 12 oz (80.627 kg)       ASSESSMENT AND PLAN:  1.  Coronary artery disease involving native coronary arteries without angina: He is overall doing very well with no significant symptoms. Continue medical therapy.  2. Essential hypertension: Blood pressure is well controlled on current medications.  3. Hyperlipidemia: Continue treatment with atorvastatin 40 mg once daily. Most recent labs in September 2016 showed an LDL of 71 and triglyceride of 157 with an HDL of 31.     Disposition:   FU with me in 1 year  Signed,  Kathlyn Sacramento, MD  12/18/2015 9:07 AM    [place]

## 2016-01-21 DIAGNOSIS — E119 Type 2 diabetes mellitus without complications: Secondary | ICD-10-CM | POA: Diagnosis not present

## 2016-01-29 DIAGNOSIS — I517 Cardiomegaly: Secondary | ICD-10-CM | POA: Diagnosis not present

## 2016-01-29 DIAGNOSIS — I2510 Atherosclerotic heart disease of native coronary artery without angina pectoris: Secondary | ICD-10-CM | POA: Diagnosis not present

## 2016-01-29 DIAGNOSIS — I213 ST elevation (STEMI) myocardial infarction of unspecified site: Secondary | ICD-10-CM | POA: Diagnosis not present

## 2016-02-04 DIAGNOSIS — I2510 Atherosclerotic heart disease of native coronary artery without angina pectoris: Secondary | ICD-10-CM | POA: Diagnosis not present

## 2016-02-04 DIAGNOSIS — E119 Type 2 diabetes mellitus without complications: Secondary | ICD-10-CM | POA: Diagnosis not present

## 2016-02-04 DIAGNOSIS — I213 ST elevation (STEMI) myocardial infarction of unspecified site: Secondary | ICD-10-CM | POA: Diagnosis not present

## 2016-02-04 DIAGNOSIS — I517 Cardiomegaly: Secondary | ICD-10-CM | POA: Diagnosis not present

## 2016-02-18 DIAGNOSIS — E119 Type 2 diabetes mellitus without complications: Secondary | ICD-10-CM | POA: Diagnosis not present

## 2016-02-18 DIAGNOSIS — E785 Hyperlipidemia, unspecified: Secondary | ICD-10-CM | POA: Diagnosis not present

## 2016-02-18 DIAGNOSIS — I517 Cardiomegaly: Secondary | ICD-10-CM | POA: Diagnosis not present

## 2016-02-18 DIAGNOSIS — I2510 Atherosclerotic heart disease of native coronary artery without angina pectoris: Secondary | ICD-10-CM | POA: Diagnosis not present

## 2016-02-19 DIAGNOSIS — I517 Cardiomegaly: Secondary | ICD-10-CM | POA: Diagnosis not present

## 2016-02-19 DIAGNOSIS — E119 Type 2 diabetes mellitus without complications: Secondary | ICD-10-CM | POA: Diagnosis not present

## 2016-02-19 DIAGNOSIS — I739 Peripheral vascular disease, unspecified: Secondary | ICD-10-CM | POA: Diagnosis not present

## 2016-02-19 DIAGNOSIS — E785 Hyperlipidemia, unspecified: Secondary | ICD-10-CM | POA: Diagnosis not present

## 2016-02-22 ENCOUNTER — Ambulatory Visit
Admission: RE | Admit: 2016-02-22 | Discharge: 2016-02-22 | Disposition: A | Discharge status: Home / Self Care | Payer: Medicare Other | Source: Ambulatory Visit | Attending: Cardiology | Admitting: Cardiology

## 2016-02-22 ENCOUNTER — Ambulatory Visit
Admission: RE | Admit: 2016-02-22 | Discharge: 2016-02-22 | Disposition: A | Discharge status: Home / Self Care | Payer: Medicare Other | Source: Ambulatory Visit | Attending: Internal Medicine | Admitting: Internal Medicine

## 2016-02-22 ENCOUNTER — Other Ambulatory Visit: Payer: Self-pay | Admitting: Cardiology

## 2016-02-22 DIAGNOSIS — M25552 Pain in left hip: Secondary | ICD-10-CM

## 2016-02-25 DIAGNOSIS — I213 ST elevation (STEMI) myocardial infarction of unspecified site: Secondary | ICD-10-CM | POA: Diagnosis not present

## 2016-02-25 DIAGNOSIS — I25810 Atherosclerosis of coronary artery bypass graft(s) without angina pectoris: Secondary | ICD-10-CM | POA: Diagnosis not present

## 2016-02-25 DIAGNOSIS — I2510 Atherosclerotic heart disease of native coronary artery without angina pectoris: Secondary | ICD-10-CM | POA: Diagnosis not present

## 2016-02-25 DIAGNOSIS — E119 Type 2 diabetes mellitus without complications: Secondary | ICD-10-CM | POA: Diagnosis not present

## 2016-03-25 DIAGNOSIS — K219 Gastro-esophageal reflux disease without esophagitis: Secondary | ICD-10-CM | POA: Diagnosis not present

## 2016-03-25 DIAGNOSIS — I25810 Atherosclerosis of coronary artery bypass graft(s) without angina pectoris: Secondary | ICD-10-CM | POA: Diagnosis not present

## 2016-03-25 DIAGNOSIS — E785 Hyperlipidemia, unspecified: Secondary | ICD-10-CM | POA: Diagnosis not present

## 2016-06-08 DIAGNOSIS — E119 Type 2 diabetes mellitus without complications: Secondary | ICD-10-CM | POA: Diagnosis not present

## 2016-06-08 DIAGNOSIS — E785 Hyperlipidemia, unspecified: Secondary | ICD-10-CM | POA: Diagnosis not present

## 2016-06-08 DIAGNOSIS — K219 Gastro-esophageal reflux disease without esophagitis: Secondary | ICD-10-CM | POA: Diagnosis not present

## 2016-06-08 DIAGNOSIS — I25810 Atherosclerosis of coronary artery bypass graft(s) without angina pectoris: Secondary | ICD-10-CM | POA: Diagnosis not present

## 2016-09-14 ENCOUNTER — Other Ambulatory Visit
Admission: RE | Admit: 2016-09-14 | Discharge: 2016-09-14 | Disposition: A | Discharge status: Home / Self Care | Payer: Medicare Other | Source: Ambulatory Visit | Attending: Internal Medicine | Admitting: Internal Medicine

## 2016-09-14 DIAGNOSIS — I2510 Atherosclerotic heart disease of native coronary artery without angina pectoris: Secondary | ICD-10-CM | POA: Diagnosis not present

## 2016-09-14 DIAGNOSIS — E785 Hyperlipidemia, unspecified: Secondary | ICD-10-CM | POA: Diagnosis not present

## 2016-09-14 DIAGNOSIS — E119 Type 2 diabetes mellitus without complications: Secondary | ICD-10-CM | POA: Diagnosis not present

## 2016-09-14 DIAGNOSIS — I25810 Atherosclerosis of coronary artery bypass graft(s) without angina pectoris: Secondary | ICD-10-CM | POA: Diagnosis not present

## 2016-09-14 DIAGNOSIS — K219 Gastro-esophageal reflux disease without esophagitis: Secondary | ICD-10-CM | POA: Diagnosis not present

## 2016-09-14 LAB — LIPID PANEL
Cholesterol: 139 mg/dL (ref 0–200)
HDL: 30 mg/dL — ABNORMAL LOW (ref >40)
LDL Cholesterol: 87 mg/dL (ref 0–99)
Total CHOL/HDL Ratio: 4.6 RATIO
Triglycerides: 108 mg/dL (ref <150)
VLDL: 22 mg/dL (ref 0–40)

## 2016-09-14 LAB — CBC
HCT: 44.3 % (ref 40.0–52.0)
Hemoglobin: 15 g/dL (ref 13.0–18.0)
MCH: 28.9 pg (ref 26.0–34.0)
MCHC: 33.8 g/dL (ref 32.0–36.0)
MCV: 85.6 fL (ref 80.0–100.0)
Platelets: 141 10*3/uL — ABNORMAL LOW (ref 150–440)
RBC: 5.17 MIL/uL (ref 4.40–5.90)
RDW: 13.5 % (ref 11.5–14.5)
WBC: 5.6 10*3/uL (ref 3.8–10.6)

## 2016-09-14 LAB — URINALYSIS, COMPLETE (UACMP) WITH MICROSCOPIC
Bacteria, UA: NONE SEEN
Bilirubin Urine: NEGATIVE
Glucose, UA: >=500 mg/dL — AB
Hgb urine dipstick: NEGATIVE
Ketones, ur: NEGATIVE mg/dL
Leukocytes, UA: NEGATIVE
Nitrite: NEGATIVE
Protein, ur: NEGATIVE mg/dL
RBC / HPF: NONE SEEN RBC/hpf (ref 0–5)
Specific Gravity, Urine: 1.01 (ref 1.005–1.030)
Squamous Epithelial / LPF: NONE SEEN
pH: 6 (ref 5.0–8.0)

## 2016-09-14 LAB — TSH: TSH: 0.958 u[IU]/mL (ref 0.350–4.500)

## 2016-09-14 LAB — BASIC METABOLIC PANEL
Anion gap: 9 (ref 5–15)
BUN: 9 mg/dL (ref 6–20)
CO2: 24 mmol/L (ref 22–32)
Calcium: 8.7 mg/dL — ABNORMAL LOW (ref 8.9–10.3)
Chloride: 104 mmol/L (ref 101–111)
Creatinine, Ser: 0.79 mg/dL (ref 0.61–1.24)
GFR calc Af Amer: >60 mL/min (ref >60)
GFR calc non Af Amer: >60 mL/min (ref >60)
Glucose, Bld: 240 mg/dL — ABNORMAL HIGH (ref 65–99)
Potassium: 4.4 mmol/L (ref 3.5–5.1)
Sodium: 137 mmol/L (ref 135–145)

## 2016-09-15 LAB — HEMOGLOBIN A1C
Hgb A1c MFr Bld: 9.1 % — ABNORMAL HIGH (ref 4.8–5.6)
Mean Plasma Glucose: 214 mg/dL

## 2016-09-20 DIAGNOSIS — I25810 Atherosclerosis of coronary artery bypass graft(s) without angina pectoris: Secondary | ICD-10-CM | POA: Diagnosis not present

## 2016-09-20 DIAGNOSIS — E119 Type 2 diabetes mellitus without complications: Secondary | ICD-10-CM | POA: Diagnosis not present

## 2016-09-20 DIAGNOSIS — E785 Hyperlipidemia, unspecified: Secondary | ICD-10-CM | POA: Diagnosis not present

## 2016-12-12 DIAGNOSIS — K219 Gastro-esophageal reflux disease without esophagitis: Secondary | ICD-10-CM | POA: Diagnosis not present

## 2016-12-12 DIAGNOSIS — E785 Hyperlipidemia, unspecified: Secondary | ICD-10-CM | POA: Diagnosis not present

## 2016-12-12 DIAGNOSIS — I25810 Atherosclerosis of coronary artery bypass graft(s) without angina pectoris: Secondary | ICD-10-CM | POA: Diagnosis not present

## 2016-12-12 DIAGNOSIS — E119 Type 2 diabetes mellitus without complications: Secondary | ICD-10-CM | POA: Diagnosis not present

## 2017-02-03 ENCOUNTER — Ambulatory Visit (INDEPENDENT_AMBULATORY_CARE_PROVIDER_SITE_OTHER): Payer: Medicare Other | Admitting: Cardiovascular Disease

## 2017-02-03 ENCOUNTER — Telehealth: Payer: Self-pay | Admitting: Cardiovascular Disease

## 2017-02-03 ENCOUNTER — Other Ambulatory Visit: Payer: Self-pay

## 2017-02-03 ENCOUNTER — Encounter: Payer: Self-pay | Admitting: Cardiovascular Disease

## 2017-02-03 VITALS — BP 120/80 | HR 58 | Ht 67.0 in | Wt 170.2 lb

## 2017-02-03 DIAGNOSIS — I10 Essential (primary) hypertension: Secondary | ICD-10-CM | POA: Diagnosis not present

## 2017-02-03 DIAGNOSIS — E119 Type 2 diabetes mellitus without complications: Secondary | ICD-10-CM

## 2017-02-03 DIAGNOSIS — E782 Mixed hyperlipidemia: Secondary | ICD-10-CM

## 2017-02-03 DIAGNOSIS — I2510 Atherosclerotic heart disease of native coronary artery without angina pectoris: Secondary | ICD-10-CM

## 2017-02-03 MED ORDER — ROSUVASTATIN CALCIUM 40 MG PO TABS: 40.0000 mg | ORAL_TABLET | Freq: Every day | ORAL | 2 refills | Status: DC

## 2017-02-03 MED ORDER — ROSUVASTATIN CALCIUM 40 MG PO TABS: 40.0000 mg | ORAL_TABLET | Freq: Every day | ORAL | 11 refills | Status: DC

## 2017-02-03 NOTE — Patient Instructions (Addendum)
Medication Instructions:  Your physician has recommended you make the following change in your medication:  STOP taking atorvastatin START taking rosuvastatin 40mg  once daily   Labwork: Fasting lipid and liver profile in 6 weeks. Nothing to eat or drink after midnight the evening before your labs. You may have your labs at your PCP office. Please take the lab orders given to you today to your PCP.   Testing/Procedures: none  Follow-Up: Your physician wants you to follow-up in: 1 year with Dr. Fletcher Anon.  You will receive a reminder letter in the mail two months in advance. If you don't receive a letter, please call our office to schedule the follow-up appointment.   Any Other Special Instructions Will Be Listed Below (If Applicable).     If you need a refill on your cardiac medications before your next appointment, please call your pharmacy.

## 2017-02-03 NOTE — Progress Notes (Signed)
Cardiology Office Note   Date:  02/03/2017   ID:  Scott Doyle, DOB 08-19-1961, MRN [id number]  PCP:  Cletis Athens, MD  Cardiologist:   Kathlyn Sacramento, MD   Chief Complaint  Patient presents with  . other    12 month follow up. Meds reviewed by the pt. verbally. "doing well."       History of Present Illness: Scott Doyle is a 56 y.o. male who presents for  followup visit regarding coronary artery disease. He is status post CABG and PCI of diagonal. He has other medical problems that include hypertension, hyperlipidemia and type 2 diabetes. Most recent cardiac catheterization in December 2015 showed patent LIMA to LAD and patent stents in the diagonal. There was no evidence of obstructive coronary artery disease.  He has been doing well overall with no recent chest pain, shortness of breath or palpitations. He has been taking his medications regularly but admits to unhealthy diet. Most recent hemoglobin A1c was 9 and LDL was 87. He reports having a treadmill stress test by Dr. Lavera Guise which was unremarkable.  Past Medical History:  Diagnosis Date  . Anxiety   . Coronary artery disease    Previous CABG. Most recent cath 01/2012: patent LIMA to LAD, patent stent in first diagonal with mild ISR, 90% ostial disease. Normal EF.  PCI and DES placement to ostial D1 extending into LAD.   . Diabetes mellitus   . Hyperlipidemia   . Hypertension   . Thrombocytopathia [facility])     Past Surgical History:  Procedure Laterality Date  . CARDIAC CATHETERIZATION    . CARDIAC CATHETERIZATION  08/2014   Patent LIMA to LAD and patent diagonal stents.  . CORONARY ARTERY BYPASS GRAFT  2001   [facility]     Current Outpatient Prescriptions  Medication Sig Dispense Refill  . aspirin 81 MG tablet Take 81 mg by mouth daily.    Marland Kitchen atenolol (TENORMIN) 50 MG tablet Take 25 mg by mouth daily.    . fish oil-omega-3 fatty acids 1000 MG capsule Take 1 g by mouth daily.    Marland Kitchen glipiZIDE (GLUCOTROL XL) 2.5 MG  24 hr tablet Take 2.5 mg by mouth daily.  3  . isosorbide mononitrate (IMDUR) 30 MG 24 hr tablet Take 3 tablets by mouth Daily.    Marland Kitchen LORazepam (ATIVAN) 0.5 MG tablet Take 1 tablet (0.5 mg total) by mouth every 8 (eight) hours as needed. 30 tablet 0  . metFORMIN (GLUCOPHAGE) 1000 MG tablet Take 1 tablet by mouth Twice daily.    . nitroGLYCERIN (NITROSTAT) 0.4 MG SL tablet Place 0.4 mg under the tongue every 5 (five) minutes as needed.    . quinapril (ACCUPRIL) 10 MG tablet Take 1 tablet by mouth Daily.    . ranitidine (ZANTAC) 150 MG tablet Take 1 tablet by mouth Twice daily.    . Tamsulosin HCl (FLOMAX) 0.4 MG CAPS Take 1 tablet by mouth Daily.     No current facility-administered medications for this visit.     Allergies:   Ampicillin and Penicillins    Social History:  The patient  reports that he has quit smoking. He has never used smokeless tobacco. He reports that he does not drink alcohol or use drugs.   Family History:  The patient's family history includes Heart disease in his mother; Hypertension in his mother.    ROS:  Please see the history of present illness.   Otherwise, review of systems are positive for none.  All other systems are reviewed and negative.    PHYSICAL EXAM: VS:  BP 120/80 (BP Location: Left Arm, Patient Position: Sitting, Cuff Size: Normal)   Pulse (!) 58   Ht 5\' 7"  (1.702 m)   Wt 170 lb 4 oz (77.2 kg)   BMI 26.66 kg/m  , BMI Body mass index is 26.66 kg/m. GEN: Well nourished, well developed, in no acute distress  HEENT: normal  Neck: no JVD, carotid bruits, or masses Cardiac: RRR; no rubs, or gallops,no edema . There is 1/ 6 systolic ejection murmur in the aortic area Respiratory:  clear to auscultation bilaterally, normal work of breathing GI: soft, nontender, nondistended, + BS MS: no deformity or atrophy  Skin: warm and dry, no rash Neuro:  Strength and sensation are intact Psych: euthymic mood, full affect   EKG:  EKG is ordered  today. The ekg ordered today demonstrates sinus bradycardia with no significant ST or T wave changes.   Recent Labs: 09/14/2016: BUN 9; Creatinine, Ser 0.79; Hemoglobin 15.0; Platelets 141; Potassium 4.4; Sodium 137; TSH 0.958    Lipid Panel    Component Value Date/Time   CHOL 139 09/14/2016 1037   CHOL 95 11/25/2011 1201   TRIG 108 09/14/2016 1037   TRIG 76 11/25/2011 1201   HDL 30 (L) 09/14/2016 1037   HDL 29 (L) 11/25/2011 1201   CHOLHDL 4.6 09/14/2016 1037   VLDL 22 09/14/2016 1037   VLDL 15 11/25/2011 1201   LDLCALC 87 09/14/2016 1037   LDLCALC 51 11/25/2011 1201      Wt Readings from Last 3 Encounters:  02/03/17 170 lb 4 oz (77.2 kg)  12/18/15 175 lb 4 oz (79.5 kg)  05/21/15 174 lb 12 oz (79.3 kg)       ASSESSMENT AND PLAN:  1.  Coronary artery disease involving native coronary arteries without angina: He is overall doing very well with no significant symptoms. Continue medical therapy.  2. Essential hypertension: Blood pressure is well controlled on current medications.  3. Hyperlipidemia:  Most recent LDL was 87 which is not on target. Thus, I elected to switch atorvastatin to rosuvastatin 40 mg once daily. Repeat lipid and liver profile in 6 weeks. I discussed with him the importance of healthy diet and exercise.  4. Type 2 diabetes: Uncontrolled with most recent hemoglobin A1c of 9.1. He has not been following healthy diet and this was discussed with him today.   Disposition:   FU with me in 1 year  Signed,  Kathlyn Sacramento, MD  02/03/2017 11:11 AM    [place]

## 2017-02-03 NOTE — Telephone Encounter (Signed)
Pt calling asking if we can send in a 90 day supply on Crestor   We only sent in only 30 day  He was not sure if we are to send in 30 day and let him try it if that's the case please call him back If not please send in 90 day to [facility] in [place]   Please advise

## 2017-02-03 NOTE — Telephone Encounter (Signed)
Notified pt of 90 day rosuvastatin supply submitted to [facility] as requested.

## 2017-02-20 DIAGNOSIS — E785 Hyperlipidemia, unspecified: Secondary | ICD-10-CM | POA: Diagnosis not present

## 2017-02-20 DIAGNOSIS — E784 Other hyperlipidemia: Secondary | ICD-10-CM | POA: Diagnosis not present

## 2017-02-20 DIAGNOSIS — I517 Cardiomegaly: Secondary | ICD-10-CM | POA: Diagnosis not present

## 2017-02-20 DIAGNOSIS — E119 Type 2 diabetes mellitus without complications: Secondary | ICD-10-CM | POA: Diagnosis not present

## 2017-02-20 DIAGNOSIS — I10 Essential (primary) hypertension: Secondary | ICD-10-CM | POA: Diagnosis not present

## 2017-02-20 DIAGNOSIS — E034 Atrophy of thyroid (acquired): Secondary | ICD-10-CM | POA: Diagnosis not present

## 2017-02-20 DIAGNOSIS — K219 Gastro-esophageal reflux disease without esophagitis: Secondary | ICD-10-CM | POA: Diagnosis not present

## 2017-02-28 DIAGNOSIS — E785 Hyperlipidemia, unspecified: Secondary | ICD-10-CM | POA: Diagnosis not present

## 2017-02-28 DIAGNOSIS — I517 Cardiomegaly: Secondary | ICD-10-CM | POA: Diagnosis not present

## 2017-02-28 DIAGNOSIS — K219 Gastro-esophageal reflux disease without esophagitis: Secondary | ICD-10-CM | POA: Diagnosis not present

## 2017-02-28 DIAGNOSIS — E119 Type 2 diabetes mellitus without complications: Secondary | ICD-10-CM | POA: Diagnosis not present

## 2017-03-30 DIAGNOSIS — I2510 Atherosclerotic heart disease of native coronary artery without angina pectoris: Secondary | ICD-10-CM | POA: Diagnosis not present

## 2017-03-30 DIAGNOSIS — I25810 Atherosclerosis of coronary artery bypass graft(s) without angina pectoris: Secondary | ICD-10-CM | POA: Diagnosis not present

## 2017-03-30 DIAGNOSIS — E119 Type 2 diabetes mellitus without complications: Secondary | ICD-10-CM | POA: Diagnosis not present

## 2017-04-05 DIAGNOSIS — I517 Cardiomegaly: Secondary | ICD-10-CM | POA: Diagnosis not present

## 2017-04-05 DIAGNOSIS — I25810 Atherosclerosis of coronary artery bypass graft(s) without angina pectoris: Secondary | ICD-10-CM | POA: Diagnosis not present

## 2017-04-05 DIAGNOSIS — E119 Type 2 diabetes mellitus without complications: Secondary | ICD-10-CM | POA: Diagnosis not present

## 2017-04-21 ENCOUNTER — Telehealth: Payer: Self-pay | Admitting: Cardiovascular Disease

## 2017-04-21 NOTE — Telephone Encounter (Signed)
Pt had lipid and liver profile at Dr. Jennette Kettle office at the end of June. Results note sent to Dr. Fletcher Anon.  S/w Caryl Pina in PCP office. She will fax labs to our office.

## 2017-04-24 NOTE — Telephone Encounter (Signed)
Labs scanned in to pt chart and routed to Dr. Fletcher Anon for review

## 2017-05-01 ENCOUNTER — Telehealth: Payer: Self-pay | Admitting: Cardiovascular Disease

## 2017-05-01 NOTE — Telephone Encounter (Signed)
Error

## 2017-05-17 DIAGNOSIS — I25810 Atherosclerosis of coronary artery bypass graft(s) without angina pectoris: Secondary | ICD-10-CM | POA: Diagnosis not present

## 2017-05-17 DIAGNOSIS — E119 Type 2 diabetes mellitus without complications: Secondary | ICD-10-CM | POA: Diagnosis not present

## 2017-05-17 DIAGNOSIS — I517 Cardiomegaly: Secondary | ICD-10-CM | POA: Diagnosis not present

## 2017-05-17 DIAGNOSIS — E785 Hyperlipidemia, unspecified: Secondary | ICD-10-CM | POA: Diagnosis not present

## 2017-06-14 DIAGNOSIS — I517 Cardiomegaly: Secondary | ICD-10-CM | POA: Diagnosis not present

## 2017-06-14 DIAGNOSIS — I25810 Atherosclerosis of coronary artery bypass graft(s) without angina pectoris: Secondary | ICD-10-CM | POA: Diagnosis not present

## 2017-06-14 DIAGNOSIS — K219 Gastro-esophageal reflux disease without esophagitis: Secondary | ICD-10-CM | POA: Diagnosis not present

## 2017-06-14 DIAGNOSIS — E119 Type 2 diabetes mellitus without complications: Secondary | ICD-10-CM | POA: Diagnosis not present

## 2017-06-30 DIAGNOSIS — I2510 Atherosclerotic heart disease of native coronary artery without angina pectoris: Secondary | ICD-10-CM | POA: Diagnosis not present

## 2017-07-04 DIAGNOSIS — K219 Gastro-esophageal reflux disease without esophagitis: Secondary | ICD-10-CM | POA: Diagnosis not present

## 2017-07-04 DIAGNOSIS — I517 Cardiomegaly: Secondary | ICD-10-CM | POA: Diagnosis not present

## 2017-07-04 DIAGNOSIS — E119 Type 2 diabetes mellitus without complications: Secondary | ICD-10-CM | POA: Diagnosis not present

## 2017-07-04 DIAGNOSIS — I25810 Atherosclerosis of coronary artery bypass graft(s) without angina pectoris: Secondary | ICD-10-CM | POA: Diagnosis not present

## 2017-08-11 DIAGNOSIS — L821 Other seborrheic keratosis: Secondary | ICD-10-CM | POA: Diagnosis not present

## 2017-09-14 DIAGNOSIS — E119 Type 2 diabetes mellitus without complications: Secondary | ICD-10-CM | POA: Diagnosis not present

## 2017-09-14 DIAGNOSIS — I517 Cardiomegaly: Secondary | ICD-10-CM | POA: Diagnosis not present

## 2017-09-14 DIAGNOSIS — I25810 Atherosclerosis of coronary artery bypass graft(s) without angina pectoris: Secondary | ICD-10-CM | POA: Diagnosis not present

## 2017-09-14 DIAGNOSIS — K219 Gastro-esophageal reflux disease without esophagitis: Secondary | ICD-10-CM | POA: Diagnosis not present

## 2017-09-14 DIAGNOSIS — M1710 Unilateral primary osteoarthritis, unspecified knee: Secondary | ICD-10-CM | POA: Diagnosis not present

## 2017-10-30 DIAGNOSIS — K219 Gastro-esophageal reflux disease without esophagitis: Secondary | ICD-10-CM | POA: Diagnosis not present

## 2017-10-30 DIAGNOSIS — I517 Cardiomegaly: Secondary | ICD-10-CM | POA: Diagnosis not present

## 2017-10-30 DIAGNOSIS — J208 Acute bronchitis due to other specified organisms: Secondary | ICD-10-CM | POA: Diagnosis not present

## 2017-10-30 DIAGNOSIS — E119 Type 2 diabetes mellitus without complications: Secondary | ICD-10-CM | POA: Diagnosis not present

## 2017-12-14 DIAGNOSIS — I517 Cardiomegaly: Secondary | ICD-10-CM | POA: Diagnosis not present

## 2017-12-14 DIAGNOSIS — M5127 Other intervertebral disc displacement, lumbosacral region: Secondary | ICD-10-CM | POA: Diagnosis not present

## 2017-12-14 DIAGNOSIS — I25810 Atherosclerosis of coronary artery bypass graft(s) without angina pectoris: Secondary | ICD-10-CM | POA: Diagnosis not present

## 2017-12-14 DIAGNOSIS — K219 Gastro-esophageal reflux disease without esophagitis: Secondary | ICD-10-CM | POA: Diagnosis not present

## 2017-12-31 ENCOUNTER — Inpatient Hospital Stay (HOSPITAL_COMMUNITY)
Admission: AD | Admit: 2017-12-31 | Discharge: 2018-01-05 | DRG: 885 | Disposition: A | Discharge status: Home / Self Care | Payer: Medicare Other | Source: Intra-hospital | Attending: Psychiatry | Admitting: Psychiatry

## 2017-12-31 ENCOUNTER — Other Ambulatory Visit: Payer: Self-pay

## 2017-12-31 ENCOUNTER — Emergency Department: Payer: Medicare Other

## 2017-12-31 ENCOUNTER — Emergency Department
Admission: EM | Admit: 2017-12-31 | Discharge: 2017-12-31 | Disposition: A | Discharge status: Other Acute Inpatient Hospital | Payer: Medicare Other | Attending: Emergency Medicine | Admitting: Emergency Medicine

## 2017-12-31 ENCOUNTER — Encounter (HOSPITAL_COMMUNITY): Payer: Self-pay | Admitting: *Deleted

## 2017-12-31 ENCOUNTER — Encounter: Payer: Self-pay | Admitting: Emergency Medicine

## 2017-12-31 DIAGNOSIS — F319 Bipolar disorder, unspecified: Secondary | ICD-10-CM

## 2017-12-31 DIAGNOSIS — R4182 Altered mental status, unspecified: Secondary | ICD-10-CM | POA: Insufficient documentation

## 2017-12-31 DIAGNOSIS — F259 Schizoaffective disorder, unspecified: Secondary | ICD-10-CM | POA: Diagnosis present

## 2017-12-31 DIAGNOSIS — I2510 Atherosclerotic heart disease of native coronary artery without angina pectoris: Secondary | ICD-10-CM | POA: Insufficient documentation

## 2017-12-31 DIAGNOSIS — E119 Type 2 diabetes mellitus without complications: Secondary | ICD-10-CM | POA: Diagnosis present

## 2017-12-31 DIAGNOSIS — F3170 Bipolar disorder, currently in remission, most recent episode unspecified: Secondary | ICD-10-CM | POA: Insufficient documentation

## 2017-12-31 DIAGNOSIS — Z9119 Patient's noncompliance with other medical treatment and regimen: Secondary | ICD-10-CM | POA: Diagnosis not present

## 2017-12-31 DIAGNOSIS — I10 Essential (primary) hypertension: Secondary | ICD-10-CM | POA: Insufficient documentation

## 2017-12-31 DIAGNOSIS — F419 Anxiety disorder, unspecified: Secondary | ICD-10-CM | POA: Diagnosis present

## 2017-12-31 DIAGNOSIS — R079 Chest pain, unspecified: Secondary | ICD-10-CM | POA: Insufficient documentation

## 2017-12-31 DIAGNOSIS — Z7984 Long term (current) use of oral hypoglycemic drugs: Secondary | ICD-10-CM | POA: Diagnosis not present

## 2017-12-31 DIAGNOSIS — Z951 Presence of aortocoronary bypass graft: Secondary | ICD-10-CM | POA: Diagnosis not present

## 2017-12-31 DIAGNOSIS — I4510 Unspecified right bundle-branch block: Secondary | ICD-10-CM | POA: Diagnosis not present

## 2017-12-31 DIAGNOSIS — Z79899 Other long term (current) drug therapy: Secondary | ICD-10-CM | POA: Diagnosis not present

## 2017-12-31 DIAGNOSIS — R45850 Homicidal ideations: Secondary | ICD-10-CM | POA: Diagnosis not present

## 2017-12-31 DIAGNOSIS — Z6379 Other stressful life events affecting family and household: Secondary | ICD-10-CM | POA: Diagnosis not present

## 2017-12-31 DIAGNOSIS — Z8659 Personal history of other mental and behavioral disorders: Secondary | ICD-10-CM

## 2017-12-31 DIAGNOSIS — E785 Hyperlipidemia, unspecified: Secondary | ICD-10-CM | POA: Diagnosis present

## 2017-12-31 DIAGNOSIS — Z7982 Long term (current) use of aspirin: Secondary | ICD-10-CM | POA: Insufficient documentation

## 2017-12-31 DIAGNOSIS — Z046 Encounter for general psychiatric examination, requested by authority: Secondary | ICD-10-CM | POA: Diagnosis present

## 2017-12-31 DIAGNOSIS — R51 Headache: Secondary | ICD-10-CM | POA: Diagnosis not present

## 2017-12-31 DIAGNOSIS — Z87891 Personal history of nicotine dependence: Secondary | ICD-10-CM | POA: Insufficient documentation

## 2017-12-31 DIAGNOSIS — F3113 Bipolar disorder, current episode manic without psychotic features, severe: Secondary | ICD-10-CM | POA: Diagnosis not present

## 2017-12-31 DIAGNOSIS — F39 Unspecified mood [affective] disorder: Secondary | ICD-10-CM | POA: Diagnosis not present

## 2017-12-31 LAB — URINE DRUG SCREEN, QUALITATIVE (ARMC ONLY)
AMPHETAMINES, UR SCREEN: NOT DETECTED
BARBITURATES, UR SCREEN: NOT DETECTED
Benzodiazepine, Ur Scrn: NOT DETECTED
COCAINE METABOLITE, UR ~~LOC~~: NOT DETECTED
Cannabinoid 50 Ng, Ur ~~LOC~~: NOT DETECTED
MDMA (Ecstasy)Ur Screen: NOT DETECTED
METHADONE SCREEN, URINE: NOT DETECTED
OPIATE, UR SCREEN: NOT DETECTED
Phencyclidine (PCP) Ur S: NOT DETECTED
TRICYCLIC, UR SCREEN: NOT DETECTED

## 2017-12-31 LAB — COMPREHENSIVE METABOLIC PANEL
ALBUMIN: 4 g/dL (ref 3.5–5.0)
ALK PHOS: 110 U/L (ref 38–126)
ALT: 35 U/L (ref 17–63)
ANION GAP: 9 (ref 5–15)
AST: 32 U/L (ref 15–41)
BUN: 11 mg/dL (ref 6–20)
CALCIUM: 8.7 mg/dL — AB (ref 8.9–10.3)
CHLORIDE: 105 mmol/L (ref 101–111)
CO2: 25 mmol/L (ref 22–32)
Creatinine, Ser: 0.92 mg/dL (ref 0.61–1.24)
GFR calc non Af Amer: >60 mL/min (ref >60)
GLUCOSE: 268 mg/dL — AB (ref 65–99)
POTASSIUM: 3.8 mmol/L (ref 3.5–5.1)
SODIUM: 139 mmol/L (ref 135–145)
Total Bilirubin: 0.6 mg/dL (ref 0.3–1.2)
Total Protein: 7.1 g/dL (ref 6.5–8.1)

## 2017-12-31 LAB — ACETAMINOPHEN LEVEL

## 2017-12-31 LAB — CBC
HEMATOCRIT: 44.1 % (ref 40.0–52.0)
HEMOGLOBIN: 15.3 g/dL (ref 13.0–18.0)
MCH: 29.9 pg (ref 26.0–34.0)
MCHC: 34.7 g/dL (ref 32.0–36.0)
MCV: 86.2 fL (ref 80.0–100.0)
Platelets: 114 10*3/uL — ABNORMAL LOW (ref 150–440)
RBC: 5.11 MIL/uL (ref 4.40–5.90)
RDW: 13.8 % (ref 11.5–14.5)
WBC: 5.9 10*3/uL (ref 3.8–10.6)

## 2017-12-31 LAB — ETHANOL: Alcohol, Ethyl (B): <10 mg/dL (ref <10)

## 2017-12-31 LAB — GLUCOSE, CAPILLARY: GLUCOSE-CAPILLARY: 187 mg/dL — AB (ref 65–99)

## 2017-12-31 LAB — SALICYLATE LEVEL

## 2017-12-31 LAB — TROPONIN I: Troponin I: <0.03 ng/mL (ref <0.03)

## 2017-12-31 MED ORDER — HYDROXYZINE HCL 25 MG PO TABS: 25.0000 mg | ORAL_TABLET | Freq: Four times a day (QID) | ORAL | Status: AC | PRN

## 2017-12-31 MED ORDER — CHLORDIAZEPOXIDE HCL 25 MG PO CAPS: 25.0000 mg | ORAL_CAPSULE | Freq: Every day | ORAL | Status: DC

## 2017-12-31 MED ORDER — ROSUVASTATIN CALCIUM 20 MG PO TABS
40.0000 mg | ORAL_TABLET | Freq: Every day | ORAL | Status: DC
  Administered 2017-12-31: 40 mg via ORAL
  Filled 2017-12-31: qty 1

## 2017-12-31 MED ORDER — HALOPERIDOL 5 MG PO TABS: 5.0000 mg | ORAL_TABLET | Freq: Four times a day (QID) | ORAL | Status: DC | PRN

## 2017-12-31 MED ORDER — ISOSORBIDE MONONITRATE ER 60 MG PO TB24
90.0000 mg | ORAL_TABLET | Freq: Every day | ORAL | Status: DC
  Administered 2017-12-31: 90 mg via ORAL
  Filled 2017-12-31: qty 2

## 2017-12-31 MED ORDER — TAMSULOSIN HCL 0.4 MG PO CAPS
0.4000 mg | ORAL_CAPSULE | Freq: Every day | ORAL | Status: DC
  Filled 2017-12-31: qty 1

## 2017-12-31 MED ORDER — VITAMIN B-1 100 MG PO TABS
100.0000 mg | ORAL_TABLET | Freq: Every day | ORAL | Status: DC
  Administered 2018-01-01: 100 mg via ORAL
  Filled 2017-12-31 (×7): qty 1

## 2017-12-31 MED ORDER — ALUM & MAG HYDROXIDE-SIMETH 200-200-20 MG/5ML PO SUSP: 30.0000 mL | ORAL | Status: DC | PRN

## 2017-12-31 MED ORDER — LORAZEPAM 2 MG/ML IJ SOLN: 1.0000 mg | Freq: Four times a day (QID) | INTRAMUSCULAR | Status: DC | PRN

## 2017-12-31 MED ORDER — TAMSULOSIN HCL 0.4 MG PO CAPS
0.4000 mg | ORAL_CAPSULE | Freq: Every day | ORAL | Status: DC
  Administered 2017-12-31 – 2018-01-04 (×5): 0.4 mg via ORAL
  Filled 2017-12-31 (×8): qty 1

## 2017-12-31 MED ORDER — CHLORDIAZEPOXIDE HCL 25 MG PO CAPS: 25.0000 mg | ORAL_CAPSULE | Freq: Four times a day (QID) | ORAL | Status: AC | PRN

## 2017-12-31 MED ORDER — ASPIRIN EC 81 MG PO TBEC
81.0000 mg | DELAYED_RELEASE_TABLET | Freq: Every day | ORAL | Status: DC
  Administered 2018-01-01 – 2018-01-05 (×5): 81 mg via ORAL
  Filled 2017-12-31 (×7): qty 1

## 2017-12-31 MED ORDER — ACETAMINOPHEN 325 MG PO TABS: 650.0000 mg | ORAL_TABLET | Freq: Four times a day (QID) | ORAL | Status: DC | PRN

## 2017-12-31 MED ORDER — NITROGLYCERIN 0.4 MG SL SUBL: 0.4000 mg | SUBLINGUAL_TABLET | SUBLINGUAL | Status: DC | PRN

## 2017-12-31 MED ORDER — CHLORDIAZEPOXIDE HCL 25 MG PO CAPS: 25.0000 mg | ORAL_CAPSULE | Freq: Three times a day (TID) | ORAL | Status: DC

## 2017-12-31 MED ORDER — ATENOLOL 25 MG PO TABS
25.0000 mg | ORAL_TABLET | Freq: Every day | ORAL | Status: DC
  Administered 2017-12-31: 25 mg via ORAL
  Filled 2017-12-31: qty 1

## 2017-12-31 MED ORDER — ROSUVASTATIN CALCIUM 40 MG PO TABS
40.0000 mg | ORAL_TABLET | Freq: Every day | ORAL | Status: DC
  Administered 2018-01-01 – 2018-01-05 (×5): 40 mg via ORAL
  Filled 2017-12-31 (×7): qty 1

## 2017-12-31 MED ORDER — MAGNESIUM HYDROXIDE 400 MG/5ML PO SUSP: 30.0000 mL | Freq: Every day | ORAL | Status: DC | PRN

## 2017-12-31 MED ORDER — GLIPIZIDE ER 2.5 MG PO TB24
2.5000 mg | ORAL_TABLET | Freq: Every day | ORAL | Status: DC
Start: 2018-01-01 — End: 2018-01-05
  Administered 2018-01-01 – 2018-01-05 (×5): 2.5 mg via ORAL
  Filled 2017-12-31 (×7): qty 1

## 2017-12-31 MED ORDER — CHLORDIAZEPOXIDE HCL 25 MG PO CAPS: 25.0000 mg | ORAL_CAPSULE | ORAL | Status: DC

## 2017-12-31 MED ORDER — TRAZODONE HCL 50 MG PO TABS
50.0000 mg | ORAL_TABLET | Freq: Every evening | ORAL | Status: DC | PRN
  Filled 2017-12-31: qty 1

## 2017-12-31 MED ORDER — CHLORDIAZEPOXIDE HCL 25 MG PO CAPS
25.0000 mg | ORAL_CAPSULE | Freq: Four times a day (QID) | ORAL | Status: DC
  Administered 2018-01-01: 25 mg via ORAL
  Filled 2017-12-31 (×2): qty 1

## 2017-12-31 MED ORDER — LISINOPRIL 10 MG PO TABS
10.0000 mg | ORAL_TABLET | Freq: Every day | ORAL | Status: DC
  Administered 2018-01-01 – 2018-01-05 (×5): 10 mg via ORAL
  Filled 2017-12-31 (×7): qty 1

## 2017-12-31 MED ORDER — FAMOTIDINE 20 MG PO TABS
20.0000 mg | ORAL_TABLET | Freq: Every day | ORAL | Status: DC
  Administered 2017-12-31 – 2018-01-02 (×3): 20 mg via ORAL
  Filled 2017-12-31 (×6): qty 1

## 2017-12-31 MED ORDER — METFORMIN HCL 500 MG PO TABS
1000.0000 mg | ORAL_TABLET | Freq: Two times a day (BID) | ORAL | Status: DC
  Administered 2018-01-01 – 2018-01-05 (×9): 1000 mg via ORAL
  Filled 2017-12-31 (×13): qty 2

## 2017-12-31 MED ORDER — LOPERAMIDE HCL 2 MG PO CAPS
2.0000 mg | ORAL_CAPSULE | ORAL | Status: AC | PRN
  Administered 2018-01-01: 2 mg via ORAL
  Filled 2017-12-31: qty 1

## 2017-12-31 MED ORDER — GLIPIZIDE ER 2.5 MG PO TB24
2.5000 mg | ORAL_TABLET | Freq: Every day | ORAL | Status: DC
  Administered 2017-12-31: 2.5 mg via ORAL
  Filled 2017-12-31: qty 1

## 2017-12-31 MED ORDER — METFORMIN HCL 500 MG PO TABS
1000.0000 mg | ORAL_TABLET | Freq: Two times a day (BID) | ORAL | Status: DC
  Administered 2017-12-31: 1000 mg via ORAL
  Filled 2017-12-31: qty 2

## 2017-12-31 MED ORDER — ISOSORBIDE MONONITRATE ER 60 MG PO TB24
90.0000 mg | ORAL_TABLET | Freq: Every day | ORAL | Status: DC
  Administered 2018-01-01 – 2018-01-05 (×5): 90 mg via ORAL
  Filled 2017-12-31 (×7): qty 1

## 2017-12-31 MED ORDER — FAMOTIDINE 20 MG PO TABS
20.0000 mg | ORAL_TABLET | Freq: Every day | ORAL | Status: DC
  Filled 2017-12-31: qty 1

## 2017-12-31 MED ORDER — HALOPERIDOL LACTATE 5 MG/ML IJ SOLN: 5.0000 mg | Freq: Four times a day (QID) | INTRAMUSCULAR | Status: DC | PRN

## 2017-12-31 MED ORDER — ONDANSETRON 4 MG PO TBDP: 4.0000 mg | ORAL_TABLET | Freq: Four times a day (QID) | ORAL | Status: AC | PRN

## 2017-12-31 MED ORDER — THIAMINE HCL 100 MG/ML IJ SOLN: 100.0000 mg | Freq: Once | INTRAMUSCULAR | Status: DC

## 2017-12-31 MED ORDER — LORAZEPAM 1 MG PO TABS: 1.0000 mg | ORAL_TABLET | Freq: Four times a day (QID) | ORAL | Status: DC | PRN

## 2017-12-31 MED ORDER — ATENOLOL 25 MG PO TABS
25.0000 mg | ORAL_TABLET | Freq: Every day | ORAL | Status: DC
  Administered 2018-01-01 – 2018-01-05 (×5): 25 mg via ORAL
  Filled 2017-12-31 (×7): qty 1

## 2017-12-31 MED ORDER — ADULT MULTIVITAMIN W/MINERALS CH
1.0000 | ORAL_TABLET | Freq: Every day | ORAL | Status: DC
  Administered 2018-01-01: 1 via ORAL
  Filled 2017-12-31 (×8): qty 1

## 2017-12-31 NOTE — ED Notes (Signed)
Patient resting quietly in room. No noted distress or abnormal behaviors noted. Will continue 15 minute checks and observation by security camera for safety. 

## 2017-12-31 NOTE — BH Assessment (Signed)
Writer an alone with nurse (Amy B.,) spoke with patient to inform him the [facility] recommendation and that he is going to be admitted to [facility]. Patient voiced his understanding. RN provided patient with phone to contact family to informed them of the plan.

## 2017-12-31 NOTE — ED Notes (Signed)
Pt accepting of transfer to [facility] in [place].  Pt notified his oldest son who will inform his wife of transfer.  Maintained on 15 minute checks and observation by security camera for safety.

## 2017-12-31 NOTE — Progress Notes (Signed)
Adult Psychoeducational Group Note  Date:  12/31/2017 Time:  9:53 PM  Group Topic/Focus:  Wrap-Up Group:   The focus of this group is to help patients review their daily goal of treatment and discuss progress on daily workbooks.  Participation Level:  Active  Participation Quality:  Appropriate  Affect:  Appropriate  Cognitive:  Alert and Oriented  Insight: Good  Engagement in Group:  Engaged  Modes of Intervention:  Discussion  Additional Comments:  Pt attended group this evening.  Milas Kocher 12/31/2017, 9:53 PM

## 2017-12-31 NOTE — ED Notes (Signed)
Report called to Rolena Infante, Therapist, sports at [facility] in [place].

## 2017-12-31 NOTE — ED Notes (Signed)
emtala reviewed by this RN 

## 2017-12-31 NOTE — Tx Team (Signed)
Initial Treatment Plan 12/31/2017 8:24 PM Scott Doyle [id number]    PATIENT STRESSORS: Health problems Marital or family conflict   PATIENT STRENGTHS: Ability for insight Average or above average intelligence Capable of independent living Communication skills General fund of knowledge Motivation for treatment/growth   PATIENT IDENTIFIED PROBLEMS: Anxiety Family conflict "I need some emotional rest" "I need new ways to solve domestic problems"                     DISCHARGE CRITERIA:  Ability to meet basic life and health needs Improved stabilization in mood, thinking, and/or behavior Verbal commitment to aftercare and medication compliance  PRELIMINARY DISCHARGE PLAN: Attend aftercare/continuing care group Return to previous living arrangement  PATIENT/FAMILY INVOLVEMENT: This treatment plan has been presented to and reviewed with the patient, Scott Doyle, and/or family member, .  The patient and family have been given the opportunity to ask questions and make suggestions.  Teena Mangus, [place], [place] 12/31/2017, 8:24 PM

## 2017-12-31 NOTE — ED Provider Notes (Signed)
Patient seen and reexamined about 15 minutes ago.  He is resting comfortably watching television.  His understanding of the plan to transfer to [facility], he is agreeable with the plan.  He is alert well oriented with normal hemodynamics.  Stable for transfer.   Delman Kitten, MD 12/31/17 [phone]
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
## 2017-12-31 NOTE — BH Assessment (Signed)
Patient has been accepted to [facility].  Patient assigned to room 406-2 Accepting physician is Dr. Myles Lipps.  Call report to [phone].  Representative was [employer].   ER Staff is aware of it:  Olivia Mackie, ER Dian Situ Dr. Corky Downs, ER MD  Amy B., Patient's Nurse
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
## 2017-12-31 NOTE — ED Notes (Signed)
Pt discharged under IVC to [facility] in [place]. VS stable. Report called to [facility], Therapist, sports. All belongings sent with officer. Pt notified his family of transfer.
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
## 2017-12-31 NOTE — ED Notes (Signed)
Pt speaking with SOC. Calm and cooperative. Maintained on 15 minute checks and observation by security camera for safety.

## 2017-12-31 NOTE — ED Notes (Signed)
Pt unable to void at this time. 

## 2018-01-01 DIAGNOSIS — F419 Anxiety disorder, unspecified: Secondary | ICD-10-CM

## 2018-01-01 DIAGNOSIS — F319 Bipolar disorder, unspecified: Secondary | ICD-10-CM

## 2018-01-01 DIAGNOSIS — Z8659 Personal history of other mental and behavioral disorders: Secondary | ICD-10-CM

## 2018-01-01 DIAGNOSIS — Z6379 Other stressful life events affecting family and household: Secondary | ICD-10-CM

## 2018-01-01 LAB — GLUCOSE, CAPILLARY
GLUCOSE-CAPILLARY: 158 mg/dL — AB (ref 65–99)
GLUCOSE-CAPILLARY: 176 mg/dL — AB (ref 65–99)
GLUCOSE-CAPILLARY: 218 mg/dL — AB (ref 65–99)

## 2018-01-01 MED ORDER — DIVALPROEX SODIUM ER 250 MG PO TB24
250.0000 mg | ORAL_TABLET | Freq: Every day | ORAL | Status: DC
  Administered 2018-01-01 – 2018-01-03 (×3): 250 mg via ORAL
  Filled 2018-01-01 (×5): qty 1

## 2018-01-01 NOTE — Plan of Care (Signed)
D: Pt denies SI/HI/AVH. Pt is pleasant and cooperative. Pt stated he was 150 % better. Pt visible on the unit.   A: Pt was offered support and encouragement. Pt was given scheduled medications. Pt was encourage to attend groups. Q 15 minute checks were done for safety.   R:Pt attends groups and interacts well with peers and staff. Pt is taking medication. Pt has no complaints.Pt receptive to treatment and safety maintained on unit.   Problem: Education: Goal: Emotional status will improve Outcome: Progressing   Problem: Education: Goal: Mental status will improve Outcome: Progressing   Problem: Safety: Goal: Periods of time without injury will increase Outcome: Progressing   Problem: Safety: Goal: Ability to remain free from injury will improve Outcome: Progressing

## 2018-01-01 NOTE — Progress Notes (Signed)
Recreation Therapy Notes  Date: 4.29.19 Time: 0930 Location: 300 [address] Dayroom  Group Topic: Stress Management  Goal Area(s) Addresses:  Patient will verbalize importance of using healthy stress management.  Patient will identify positive emotions associated with healthy stress management.   Intervention: Stress Management  Activity :  Body Scan Meditation.  LRT introduced the stress management technique of meditation to patients.  LRT played a meditation that focused on identifying any feeling of tension and stress in the body.  Patients were to follow along as the meditation played.  Education:  Stress Management, Discharge Planning.   Education Outcome: Acknowledges edcuation/In group clarification offered/Needs additional education  Clinical Observations/Feedback: Pt did not attend group.    Victorino Sparrow, LRT/CTRS         Victorino Sparrow A 01/01/2018 12:31 PM
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
## 2018-01-01 NOTE — Tx Team (Signed)
Interdisciplinary Treatment and Diagnostic Plan Update  01/01/2018 Time of Session: 10:10am Scott Doyle MRN: [id number]  Principal Diagnosis: <principal problem not specified>  Secondary Diagnoses: Active Problems:   Schizoaffective disorder (HCC)   Current Medications:  Current Facility-Administered Medications  Medication Dose Route Frequency Provider Last Rate Last Dose  . acetaminophen (TYLENOL) tablet 650 mg  650 mg Oral Q6H PRN Money, Lowry Ram, FNP      . alum & mag hydroxide-simeth (MAALOX/MYLANTA) 200-200-20 MG/5ML suspension 30 mL  30 mL Oral Q4H PRN Money, Lowry Ram, FNP      . aspirin EC tablet 81 mg  81 mg Oral Daily Lindon Romp A, NP   81 mg at 01/01/18 0803  . atenolol (TENORMIN) tablet 25 mg  25 mg Oral Daily Lindon Romp A, NP   25 mg at 01/01/18 0803  . chlordiazePOXIDE (LIBRIUM) capsule 25 mg  25 mg Oral Q6H PRN Money, Lowry Ram, FNP      . chlordiazePOXIDE (LIBRIUM) capsule 25 mg  25 mg Oral QID Money, Lowry Ram, FNP   25 mg at 01/01/18 0803   Followed by  . [START ON 01/02/2018] chlordiazePOXIDE (LIBRIUM) capsule 25 mg  25 mg Oral TID Money, Lowry Ram, FNP       Followed by  . [START ON 01/03/2018] chlordiazePOXIDE (LIBRIUM) capsule 25 mg  25 mg Oral BH-qamhs Money, Lowry Ram, FNP       Followed by  . [START ON 01/05/2018] chlordiazePOXIDE (LIBRIUM) capsule 25 mg  25 mg Oral Daily Money, Lowry Ram, FNP      . famotidine (PEPCID) tablet 20 mg  20 mg Oral Daily Lindon Romp A, NP   20 mg at 01/01/18 0802  . glipiZIDE (GLUCOTROL XL) 24 hr tablet 2.5 mg  2.5 mg Oral Daily Lindon Romp A, NP   2.5 mg at 01/01/18 0801  . haloperidol (HALDOL) tablet 5 mg  5 mg Oral Q6H PRN Money, Lowry Ram, FNP       Or  . haloperidol lactate (HALDOL) injection 5 mg  5 mg Intramuscular Q6H PRN Money, Lowry Ram, FNP      . hydrOXYzine (ATARAX/VISTARIL) tablet 25 mg  25 mg Oral Q6H PRN Money, Lowry Ram, FNP      . isosorbide mononitrate (IMDUR) 24 hr tablet 90 mg  90 mg Oral Daily Lindon Romp A, NP    90 mg at 01/01/18 0802  . lisinopril (PRINIVIL,ZESTRIL) tablet 10 mg  10 mg Oral Daily Lindon Romp A, NP   10 mg at 01/01/18 0801  . loperamide (IMODIUM) capsule 2-4 mg  2-4 mg Oral PRN Money, Lowry Ram, FNP      . LORazepam (ATIVAN) tablet 1 mg  1 mg Oral Q6H PRN Money, Lowry Ram, FNP       Or  . LORazepam (ATIVAN) injection 1 mg  1 mg Intramuscular Q6H PRN Money, Darnelle Maffucci B, FNP      . magnesium hydroxide (MILK OF MAGNESIA) suspension 30 mL  30 mL Oral Daily PRN Money, Darnelle Maffucci B, FNP      . metFORMIN (GLUCOPHAGE) tablet 1,000 mg  1,000 mg Oral BID WC Lindon Romp A, NP   1,000 mg at 01/01/18 0802  . multivitamin with minerals tablet 1 tablet  1 tablet Oral Daily Money, Lowry Ram, FNP   1 tablet at 01/01/18 0802  . nitroGLYCERIN (NITROSTAT) SL tablet 0.4 mg  0.4 mg Sublingual Q5 min PRN Rozetta Nunnery, NP      . ondansetron (ZOFRAN-ODT)  disintegrating tablet 4 mg  4 mg Oral Q6H PRN Money, Lowry Ram, FNP      . rosuvastatin (CRESTOR) tablet 40 mg  40 mg Oral Daily Lindon Romp A, NP   40 mg at 01/01/18 0802  . tamsulosin (FLOMAX) capsule 0.4 mg  0.4 mg Oral QPC supper Lindon Romp A, NP   0.4 mg at 12/31/17 2233  . thiamine (B-1) injection 100 mg  100 mg Intramuscular Once Money, Darnelle Maffucci B, FNP      . thiamine (VITAMIN B-1) tablet 100 mg  100 mg Oral Daily Money, Lowry Ram, FNP   100 mg at 01/01/18 0801  . traZODone (DESYREL) tablet 50 mg  50 mg Oral QHS PRN Money, Lowry Ram, FNP       PTA Medications: Medications Prior to Admission  Medication Sig Dispense Refill Last Dose  . aspirin 81 MG tablet Take 81 mg by mouth daily.   Taking  . atenolol (TENORMIN) 50 MG tablet Take 25 mg by mouth daily.   Taking  . glipiZIDE (GLUCOTROL XL) 2.5 MG 24 hr tablet Take 2.5 mg by mouth daily.  3 Taking  . isosorbide mononitrate (IMDUR) 30 MG 24 hr tablet Take 3 tablets by mouth Daily.   Taking  . metFORMIN (GLUCOPHAGE) 1000 MG tablet Take 1 tablet by mouth Twice daily.   Taking  . nitroGLYCERIN (NITROSTAT) 0.4 MG  SL tablet Place 0.4 mg under the tongue every 5 (five) minutes as needed.   Taking  . quinapril (ACCUPRIL) 10 MG tablet Take 1 tablet by mouth Daily.   Taking  . ranitidine (ZANTAC) 150 MG tablet Take 1 tablet by mouth Twice daily.   Taking  . rosuvastatin (CRESTOR) 40 MG tablet Take 1 tablet (40 mg total) by mouth daily. 90 tablet 2   . Tamsulosin HCl (FLOMAX) 0.4 MG CAPS Take 1 tablet by mouth Daily.   Taking    Patient Stressors: Health problems Marital or family conflict  Patient Strengths: Ability for insight Average or above average intelligence Capable of independent living Communication skills General fund of knowledge Motivation for treatment/growth  Treatment Modalities: Medication Management, Group therapy, Case management,  1 to 1 session with clinician, Psychoeducation, Recreational therapy.   Physician Treatment Plan for Primary Diagnosis: <principal problem not specified> Long Term Goal(s):     Short Term Goals:    Medication Management: Evaluate patient's response, side effects, and tolerance of medication regimen.  Therapeutic Interventions: 1 to 1 sessions, Unit Group sessions and Medication administration.  Evaluation of Outcomes: Not Met  Physician Treatment Plan for Secondary Diagnosis: Active Problems:   Schizoaffective disorder ([place])  Long Term Goal(s):     Short Term Goals:       Medication Management: Evaluate patient's response, side effects, and tolerance of medication regimen.  Therapeutic Interventions: 1 to 1 sessions, Unit Group sessions and Medication administration.  Evaluation of Outcomes: Not Met   RN Treatment Plan for Primary Diagnosis: <principal problem not specified> Long Term Goal(s): Knowledge of disease and therapeutic regimen to maintain health will improve  Short Term Goals: Ability to remain free from injury will improve, Ability to verbalize frustration and anger appropriately will improve, Ability to demonstrate  self-control, Ability to participate in decision making will improve, Ability to verbalize feelings will improve, Ability to disclose and discuss suicidal ideas, Ability to identify and develop effective coping behaviors will improve and Compliance with prescribed medications will improve  Medication Management: RN will administer medications as ordered by provider, will assess  and evaluate patient's response and provide education to patient for prescribed medication. RN will report any adverse and/or side effects to prescribing provider.  Therapeutic Interventions: 1 on 1 counseling sessions, Psychoeducation, Medication administration, Evaluate responses to treatment, Monitor vital signs and CBGs as ordered, Perform/monitor CIWA, COWS, AIMS and Fall Risk screenings as ordered, Perform wound care treatments as ordered.  Evaluation of Outcomes: Not Met   LCSW Treatment Plan for Primary Diagnosis: <principal problem not specified> Long Term Goal(s): Safe transition to appropriate next level of care at discharge, Engage patient in therapeutic group addressing interpersonal concerns.  Short Term Goals: Engage patient in aftercare planning with referrals and resources, Increase social support, Increase ability to appropriately verbalize feelings, Increase emotional regulation, Facilitate acceptance of mental health diagnosis and concerns, Facilitate patient progression through stages of change regarding substance use diagnoses and concerns, Identify triggers associated with mental health/substance abuse issues and Increase skills for wellness and recovery  Therapeutic Interventions: Assess for all discharge needs, 1 to 1 time with Social worker, Explore available resources and support systems, Assess for adequacy in community support network, Educate family and significant other(s) on suicide prevention, Complete Psychosocial Assessment, Interpersonal group therapy.  Evaluation of Outcomes: Not  Met   Progress in Treatment: Attending groups: No. New to unit Participating in groups: No. Taking medication as prescribed: Yes. Toleration medication: Yes. Family/Significant other contact made: No, will contact:  CSW will continue to assess Patient understands diagnosis: Yes. Discussing patient identified problems/goals with staff: Yes. Medical problems stabilized or resolved: Yes. Denies suicidal/homicidal ideation: Yes. Issues/concerns per patient self-inventory: No. Other:  New problem(s) identified: None   New Short Term/Long Term Goal(s): medication stabilization, elimination of SI thoughts, development of comprehensive mental wellness plan.    Patient Goals: "I need some emotional rest and help coping with stress"  Discharge Plan or Barriers: CSW will continue to assess for an appropriate discharge plan.   Reason for Continuation of Hospitalization: Anxiety Medication stabilization  Estimated Length of Stay: Friday, 12/06/17  Attendees: Patient: Scott Doyle  01/01/2018 12:44 PM  Physician: Dr. Myles Lipps, MD 01/01/2018 12:44 PM  Nursing: Oren Beckmann 01/01/2018 12:44 PM  RN Care Manager: Rhunette Croft 01/01/2018 12:44 PM  Social Worker: Radonna Ricker, [place] 01/01/2018 12:44 PM  Recreational Therapist: X 01/01/2018 12:44 PM  Other: x 01/01/2018 12:44 PM  Other: x 01/01/2018 12:44 PM  Other:x 01/01/2018 12:44 PM    Scribe for Treatment Team: Marylee Floras, [place] 01/01/2018 12:44 PM
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
## 2018-01-01 NOTE — BHH Group Notes (Signed)
[place] LCSW Group Therapy Note  Date/Time: 01/01/18, 1315  Type of Therapy and Topic:  Group Therapy:  Overcoming Obstacles  Participation Level:  active  Description of Group:    In this group patients will be encouraged to explore what they see as obstacles to their own wellness and recovery. They will be guided to discuss their thoughts, feelings, and behaviors related to these obstacles. The group will process together ways to cope with barriers, with attention given to specific choices patients can make. Each patient will be challenged to identify changes they are motivated to make in order to overcome their obstacles. This group will be process-oriented, with patients participating in exploration of their own experiences as well as giving and receiving support and challenge from other group members.  Therapeutic Goals: 1. Patient will identify personal and current obstacles as they relate to admission. 2. Patient will identify barriers that currently interfere with their wellness or overcoming obstacles.  3. Patient will identify feelings, thought process and behaviors related to these barriers. 4. Patient will identify two changes they are willing to make to overcome these obstacles:    Summary of Patient Progress: Pt shared that dealing with his emotions and his adult childre are current obstacles in his life.  Pt participated in group discussion regarding steps he could take to move past his obstacles and mentioned that taking care of himself is one concrete thing he could do to make progress currently.       Therapeutic Modalities:   Cognitive Behavioral Therapy Solution Focused Therapy Motivational Interviewing Relapse Prevention Therapy  Lurline Idol, LCSW

## 2018-01-01 NOTE — Progress Notes (Signed)
Adult Psychoeducational Group Note  Date:  01/01/2018 Time:  9:57 PM  Group Topic/Focus:  Wrap-Up Group:   The focus of this group is to help patients review their daily goal of treatment and discuss progress on daily workbooks.  Participation Level:  Active  Participation Quality:  Appropriate  Affect:  Appropriate  Cognitive:  Alert  Insight: Appropriate  Engagement in Group:  Engaged  Modes of Intervention:  Discussion  Additional Comments:  Patient stated having a good day. Patient's goal for today was to "get emotional rest".   Camdin Hegner L Perle Gibbon 01/01/2018, 9:57 PM

## 2018-01-01 NOTE — Progress Notes (Signed)
Adult Psychoeducational Group Note  Date:  01/01/2018 Time:  1:45 PM  Group Topic/Focus:  Goals Group:   The focus of this group is to help patients establish daily goals to achieve during treatment and discuss how the patient can incorporate goal setting into their daily lives to aide in recovery.  Participation Level:  Active  Participation Quality:  Appropriate  Affect:  Appropriate  Cognitive:  Alert  Insight: Appropriate and Good  Engagement in Group:  Engaged  Modes of Intervention:  Discussion  Additional Comments:  Pt attended group and participated in group discussions.  Ailsa Mireles R Latravis Grine 01/01/2018, 1:45 PM
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
## 2018-01-02 ENCOUNTER — Ambulatory Visit (HOSPITAL_COMMUNITY)
Admit: 2018-01-02 | Discharge: 2018-01-02 | Disposition: A | Discharge status: Home / Self Care | Payer: Medicare Other | Attending: Psychiatry | Admitting: Psychiatry

## 2018-01-02 ENCOUNTER — Encounter (HOSPITAL_COMMUNITY): Payer: Self-pay

## 2018-01-02 DIAGNOSIS — R51 Headache: Secondary | ICD-10-CM | POA: Diagnosis not present

## 2018-01-02 DIAGNOSIS — F3113 Bipolar disorder, current episode manic without psychotic features, severe: Secondary | ICD-10-CM

## 2018-01-02 DIAGNOSIS — F39 Unspecified mood [affective] disorder: Secondary | ICD-10-CM

## 2018-01-02 DIAGNOSIS — Z87891 Personal history of nicotine dependence: Secondary | ICD-10-CM

## 2018-01-02 LAB — GLUCOSE, CAPILLARY
GLUCOSE-CAPILLARY: 266 mg/dL — AB (ref 65–99)
GLUCOSE-CAPILLARY: 237 mg/dL — AB (ref 65–99)

## 2018-01-02 MED ORDER — FAMOTIDINE 20 MG PO TABS
20.0000 mg | ORAL_TABLET | Freq: Two times a day (BID) | ORAL | Status: DC
  Administered 2018-01-02 – 2018-01-05 (×6): 20 mg via ORAL
  Filled 2018-01-02 (×11): qty 1

## 2018-01-02 NOTE — Progress Notes (Signed)
Called Radiology re CT scan.  Informed us that CT that was ordered could not be done at [facility] due "mapping of brain" and usually it was done before surgery.  NP Gaynelle Cage. Changed the order to regular CT and patient is scheduled at 1000 a.m.  MHT to accompany him.

## 2018-01-02 NOTE — Progress Notes (Signed)
Recreation Therapy Notes  Animal-Assisted Activity (AAA) Program Checklist/Progress Notes Patient Eligibility Criteria Checklist & Daily Group note for Rec Tx Intervention  Date: 4.30.19 Time: 66 Location: [address]  AAA/T Program Assumption of Risk Form signed by Teacher, music or Parent Legal Guardian YES   Patient is free of allergies or sever asthma YES   Patient reports no fear of animals YES   Patient reports no history of cruelty to animals YES   Patient understands his/her participation is voluntary YES   Patient washes hands before animal contact YES   Patient washes hands after animal contact YES   Behavioral Response: Engaged  Education: Contractor, Appropriate Animal Interaction   Education Outcome: Acknowledges understanding/In group clarification offered/Needs additional education.   Clinical Observations/Feedback: Pt attended and participated in activity.    Victorino Sparrow, LRT/CTRS         Victorino Sparrow A 01/02/2018 3:03 PM

## 2018-01-02 NOTE — Progress Notes (Signed)
Writer dicussed blood sugar of 237 with pt. Writer discussed adding sliding scale insulin at meals to reduce blood sugars. Pt stated that he would like to continue with his home regimen of Glipizide and Glucophage. Pt stated that he will reduce his sugar intake at meals. Pt then requested Zantac. Pt stated that he takes Zantac 150 mg po BID. Writer informed pt that he was ordered Pepcid 20 mg po daily. Writer notified Dr. Parke Poisson, order given to modify Pepcid to 20 mg po BID. Writer modified order and pt made aware.

## 2018-01-02 NOTE — Plan of Care (Signed)
  Problem: Education: Goal: Mental status will improve Outcome: Progressing   Problem: Health Behavior/Discharge Planning: Goal: Compliance with treatment plan for underlying cause of condition will improve Outcome: Progressing   Problem: Safety: Goal: Periods of time without injury will increase Outcome: Progressing   
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
## 2018-01-02 NOTE — Progress Notes (Signed)
Adult Psychoeducational Group Note  Date:  01/02/2018 Time: 1620  Group Topic/Focus:  Coping With Mental Health Crisis:  Group engaged in a game of [place] and discussed coping skils. The purpose of this group is to help patients identify strategies for coping with mental health crisis.   Participation Level: did not attend.

## 2018-01-02 NOTE — Progress Notes (Signed)
  DATA ACTION RESPONSE  Objective- Pt. is visible in the dayroom, seen resting in bed with eyes open.  Presents with a flat/irritable/worried affect and mood. Pt remains isolative to room provided with encouragement. Pt, however, was pleasant to Probation officer.  Subjective- Denies having any SI/HI/AVH/Pain at this time.Pt states he does not want to be here. Pt states he needs to go to court. Pt states he feels angry at his sons for lying. Pt states he does not want to take any medications while here. Pt does not want to participate in POC at this time.   1:1 interaction in private to establish rapport. Encouragement, education, & support given from staff.    Safety maintained with Q 15 checks. Continue with POC.

## 2018-01-02 NOTE — Progress Notes (Signed)
Adult Psychoeducational Group Note  Date:  01/02/2018 Time:  11:31 PM  Group Topic/Focus:  Wrap-Up Group:   The focus of this group is to help patients review their daily goal of treatment and discuss progress on daily workbooks.  Participation Level:  Active  Participation Quality:  Appropriate  Affect:  Appropriate  Cognitive:  Appropriate  Insight: Appropriate  Engagement in Group:  Engaged  Modes of Intervention:  Discussion  Additional Comments:  Patient attended group and said that his day was a 5. His coping skills were doing word search, playing basketball and sleeping.   Glady Ouderkirk W Omarius Grantham 02/15/2448, 11:31 PM
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
## 2018-01-03 LAB — GLUCOSE, CAPILLARY
Glucose-Capillary: 220 mg/dL — ABNORMAL HIGH (ref 65–99)
Glucose-Capillary: 265 mg/dL — ABNORMAL HIGH (ref 65–99)

## 2018-01-03 MED ORDER — DIVALPROEX SODIUM ER 500 MG PO TB24
500.0000 mg | ORAL_TABLET | Freq: Every day | ORAL | Status: DC
  Administered 2018-01-04 – 2018-01-05 (×2): 500 mg via ORAL
  Filled 2018-01-03 (×3): qty 1

## 2018-01-03 NOTE — Plan of Care (Signed)
  Problem: Coping: Goal: Ability to demonstrate self-control will improve Outcome: Progressing   Problem: Coping: Goal: Ability to interact with others will improve Outcome: Progressing Goal: Demonstration of participation in decision-making regarding own care will improve Outcome: Progressing

## 2018-01-03 NOTE — Progress Notes (Signed)
Nursing Progress Note: 7p-7a D: Pt currently presents with a pleasant/anxious affect and behavior. Pt states "I feel fine. Just peachy. I sleep great I don't need meds for that." Interacting minimally with the milieu. Pt reports good sleep during the previous night. Pt did attend wrap-up group.  A: Pt's labs and vitals were monitored throughout the night. Pt supported emotionally and encouraged to express concerns and questions. Pt educated on medications.  R: Pt's safety ensured with 15 minute and environmental checks. Pt currently denies SI, HI, and AVH. Pt verbally contracts to seek staff if SI,HI, or AVH occurs and to consult with staff before acting on any harmful thoughts. Will continue to monitor.

## 2018-01-03 NOTE — Progress Notes (Addendum)
[facility] MD Progress Note  01/03/2018 4:29 PM Scott Doyle  MRN:  [id number] Subjective:  Patient reports " I am doing OK". States he feels his mood is stable. Minimizes depression. Denies medication side effects.  Objective : I have discussed case with treatment team and have met with patient. 57 year old married male,  admitted under commitment by family, reporting history of bipolarity, increased irritability, dangerous behaviors, increased drinking . Recent episode of firing a firearm in his sons' presence.  With patient's consent I spoke with his wife via phone- she corroborates that patient seems to be improving and " calmer". She states that he had been more irritable and easily angered over recent weeks , different from his usual baseline .She also states that although he is religious, he seems to be more focused on religious related issues recently.  States that he had a similar episode years ago, also presenting with irritability and thoughts of harming his sons,at which time was treated with medication for a period of time but which he had stopped taking years ago. Patient denies any homicidal ideations towards anyone and specifically towards any of his adult sons . States " I was just angry that they were arguing and the youngest was drunk. I felt they were doing it on purpose to get to me". States that he also considered " chasing them  on my golf cart " . Insight into potential dangerousness of recent behavior remains somewhat limited but improved today, and states " maybe I overreacted ". States all firearms have been removed by family- wife corroborates . Head CT scan reading reviewed- no acute findings.  Of note, patient scored 28/30 on MMSE today. Thus far tolerating Depakote ER well , states " I think it is helping me feel calmer". Behavior on unit calm and in good control, pleasant on approach. Of note, DM coordinator recommended adding Novolog AC/HS to diabetic medication regimen-  patient declines. States he is going to " take better care of what I eat".  .    Principal Problem: Bipolar Disorder by history  Diagnosis:   Patient Active Problem List   Diagnosis Date Noted  . Bipolar affective disorder ([place]) [F31.9]   . Schizoaffective disorder ([place]) [F25.9] 12/31/2017  . Anxiety [F41.9]   . Coronary artery disease [I25.10]   . Hyperlipidemia [E78.5]   . Hypertension [I10]    Total Time spent with patient: 25 minutes   Past Medical History:  Past Medical History:  Diagnosis Date  . Anxiety   . Coronary artery disease    Previous CABG. Most recent cath 01/2012: patent LIMA to LAD, patent stent in first diagonal with mild ISR, 90% ostial disease. Normal EF.  PCI and DES placement to ostial D1 extending into LAD.   . Diabetes mellitus   . Hyperlipidemia   . Hypertension   . Thrombocytopathia [facility])     Past Surgical History:  Procedure Laterality Date  . CARDIAC CATHETERIZATION    . CARDIAC CATHETERIZATION  08/2014   Patent LIMA to LAD and patent diagonal stents.  . CORONARY ARTERY BYPASS GRAFT  2001   [facility]   Family History:  Family History  Problem Relation Age of Onset  . Hypertension Mother   . Heart disease Mother   . Diabetes Unknown        fx  . Cancer Unknown        mat. family   Social History:  Social History   Substance and Sexual Activity  Alcohol  Use Yes     Social History   Substance and Sexual Activity  Drug Use No    Social History   Socioeconomic History  . Marital status: Married    Spouse name: Not on file  . Number of children: Not on file  . Years of education: Not on file  . Highest education level: Not on file  Occupational History  . Not on file  Social Needs  . Financial resource strain: Not on file  . Food insecurity:    Worry: Not on file    Inability: Not on file  . Transportation needs:    Medical: Not on file    Non-medical: Not on file  Tobacco Use  . Smoking status: Former Research scientist (life sciences)  . Smokeless  tobacco: Never Used  Substance and Sexual Activity  . Alcohol use: Yes  . Drug use: No  . Sexual activity: Not on file  Lifestyle  . Physical activity:    Days per week: Not on file    Minutes per session: Not on file  . Stress: Not on file  Relationships  . Social connections:    Talks on phone: Not on file    Gets together: Not on file    Attends religious service: Not on file    Active member of club or organization: Not on file    Attends meetings of clubs or organizations: Not on file    Relationship status: Not on file  Other Topics Concern  . Not on file  Social History Narrative  . Not on file   Additional Social History:   Sleep: Good  Appetite:  Good  Current Medications: Current Facility-Administered Medications  Medication Dose Route Frequency Provider Last Rate Last Dose  . acetaminophen (TYLENOL) tablet 650 mg  650 mg Oral Q6H PRN Money, Lowry Ram, FNP      . alum & mag hydroxide-simeth (MAALOX/MYLANTA) 200-200-20 MG/5ML suspension 30 mL  30 mL Oral Q4H PRN Money, Lowry Ram, FNP      . aspirin EC tablet 81 mg  81 mg Oral Daily Lindon Romp A, NP   81 mg at 01/03/18 0746  . atenolol (TENORMIN) tablet 25 mg  25 mg Oral Daily Lindon Romp A, NP   25 mg at 01/03/18 0747  . chlordiazePOXIDE (LIBRIUM) capsule 25 mg  25 mg Oral Q6H PRN Money, Lowry Ram, FNP      . [START ON 01/04/2018] divalproex (DEPAKOTE ER) 24 hr tablet 500 mg  500 mg Oral Daily Kammie Scioli A, MD      . famotidine (PEPCID) tablet 20 mg  20 mg Oral BID Nonie Lochner, Myer Peer, MD   20 mg at 01/03/18 0746  . glipiZIDE (GLUCOTROL XL) 24 hr tablet 2.5 mg  2.5 mg Oral Daily Lindon Romp A, NP   2.5 mg at 01/03/18 0746  . haloperidol (HALDOL) tablet 5 mg  5 mg Oral Q6H PRN Money, Lowry Ram, FNP       Or  . haloperidol lactate (HALDOL) injection 5 mg  5 mg Intramuscular Q6H PRN Money, Lowry Ram, FNP      . hydrOXYzine (ATARAX/VISTARIL) tablet 25 mg  25 mg Oral Q6H PRN Money, Lowry Ram, FNP      . isosorbide  mononitrate (IMDUR) 24 hr tablet 90 mg  90 mg Oral Daily Lindon Romp A, NP   90 mg at 01/03/18 0745  . lisinopril (PRINIVIL,ZESTRIL) tablet 10 mg  10 mg Oral Daily Rozetta Nunnery, NP   10  mg at 01/03/18 0746  . loperamide (IMODIUM) capsule 2-4 mg  2-4 mg Oral PRN Money, Lowry Ram, FNP   2 mg at 01/01/18 1703  . LORazepam (ATIVAN) tablet 1 mg  1 mg Oral Q6H PRN Money, Lowry Ram, FNP       Or  . LORazepam (ATIVAN) injection 1 mg  1 mg Intramuscular Q6H PRN Money, Darnelle Maffucci B, FNP      . magnesium hydroxide (MILK OF MAGNESIA) suspension 30 mL  30 mL Oral Daily PRN Money, Darnelle Maffucci B, FNP      . metFORMIN (GLUCOPHAGE) tablet 1,000 mg  1,000 mg Oral BID WC Lindon Romp A, NP   1,000 mg at 01/03/18 0746  . multivitamin with minerals tablet 1 tablet  1 tablet Oral Daily Money, Lowry Ram, FNP   1 tablet at 01/01/18 0802  . nitroGLYCERIN (NITROSTAT) SL tablet 0.4 mg  0.4 mg Sublingual Q5 min PRN Lindon Romp A, NP      . ondansetron (ZOFRAN-ODT) disintegrating tablet 4 mg  4 mg Oral Q6H PRN Money, Lowry Ram, FNP      . rosuvastatin (CRESTOR) tablet 40 mg  40 mg Oral Daily Lindon Romp A, NP   40 mg at 01/03/18 0746  . tamsulosin (FLOMAX) capsule 0.4 mg  0.4 mg Oral QPC supper Lindon Romp A, NP   0.4 mg at 01/02/18 1711  . thiamine (B-1) injection 100 mg  100 mg Intramuscular Once Money, Darnelle Maffucci B, FNP      . thiamine (VITAMIN B-1) tablet 100 mg  100 mg Oral Daily Money, Lowry Ram, FNP   100 mg at 01/01/18 0801  . traZODone (DESYREL) tablet 50 mg  50 mg Oral QHS PRN Money, Lowry Ram, FNP        Lab Results:  Results for orders placed or performed during the hospital encounter of 12/31/17 (from the past 48 hour(s))  Glucose, capillary     Status: Abnormal   Collection Time: 01/01/18  5:10 PM  Result Value Ref Range   Glucose-Capillary 218 (H) 65 - 99 mg/dL  Glucose, capillary     Status: Abnormal   Collection Time: 01/02/18  5:49 AM  Result Value Ref Range   Glucose-Capillary 266 (H) 65 - 99 mg/dL  Glucose,  capillary     Status: Abnormal   Collection Time: 01/02/18  5:02 PM  Result Value Ref Range   Glucose-Capillary 237 (H) 65 - 99 mg/dL  Glucose, capillary     Status: Abnormal   Collection Time: 01/03/18  6:07 AM  Result Value Ref Range   Glucose-Capillary 220 (H) 65 - 99 mg/dL    Blood Alcohol level:  Lab Results  Component Value Date   ETH <10 77/41/2878    Metabolic Disorder Labs: Lab Results  Component Value Date   HGBA1C 9.1 (H) 09/14/2016   MPG 214 09/14/2016   No results found for: PROLACTIN Lab Results  Component Value Date   CHOL 139 09/14/2016   TRIG 108 09/14/2016   HDL 30 (L) 09/14/2016   CHOLHDL 4.6 09/14/2016   VLDL 22 09/14/2016   LDLCALC 87 09/14/2016   LDLCALC 51 11/25/2011    Physical Findings: AIMS: Facial and Oral Movements Muscles of Facial Expression: None, normal Lips and Perioral Area: None, normal Jaw: None, normal Tongue: None, normal,Extremity Movements Upper (arms, wrists, hands, fingers): None, normal Lower (legs, knees, ankles, toes): None, normal, Trunk Movements Neck, shoulders, hips: None, normal, Overall Severity Severity of abnormal movements (highest score from questions above):  None, normal Incapacitation due to abnormal movements: None, normal Patient's awareness of abnormal movements (rate only patient's report): No Awareness, Dental Status Current problems with teeth and/or dentures?: No Does patient usually wear dentures?: No  CIWA:  CIWA-Ar Total: 0 COWS:     Musculoskeletal: Strength & Muscle Tone: within normal limits- does not present with tremors or diaphoresis, no restlessness  Gait & Station: normal Patient leans: N/A  Psychiatric Specialty Exam: Physical Exam  ROS denies headache, no chest pain, no shortness of breath, no vomiting   Blood pressure 110/66, pulse 64, temperature 98.1 F (36.7 C), temperature source Oral, resp. rate 16, height _0  (1.651 m), weight 73 kg (161 lb).Body mass index is 26.79 kg/m.   General Appearance: improving grooming   Eye Contact:  Good  Speech:  Normal Rate  Volume:  Normal  Mood:  denies depression  Affect:  Appropriate and not irritable or overly expansive at this time  Thought Process:  Linear and Descriptions of Associations: Intact  Orientation:  Full (Time, Place, and Person)  Thought Content:  no hallucinations, no delusions, not internally preoccupied   Suicidal Thoughts:  No denies suicidal or self injurious ideations, denies homicidal or violent ideations and specifically denies any HI towards sons  Homicidal Thoughts:  No  Memory:  recent and remote grossly intact   Judgement:  Fair- improving  Insight:  Fair  Psychomotor Activity:  Normal no restlessness or agitation  Concentration:  Concentration: Good and Attention Span: Good  Recall:  Good  Fund of Knowledge:  Good  Language:  Good  Akathisia:  Negative  Handed:  Right  AIMS (if indicated):     Assets:  Communication Skills Desire for Improvement Resilience  ADL's:  Intact  Cognition:  WNL  Sleep:  Number of Hours: 5.5   Assessment - patient presents alert, attentive, without overt restlessness or agitation. Minimizes recent events and states his actions were related to sons arguing and being intoxicated. Wife reports that patient has had  increased irritability/hyperreligiosity recently, distinct from baseline and there is a history of a similar decompensation years ago. She acknowledges he seems to be improving . At this time patient denies any HI. 0x3, MMSE unremarkable at this time and Head CT scan negative for acute findings .  He is not currently presenting with symptoms of ETOH WDL.  Is tolerating Depakote ER trial well thus far . Side effects reviewed .  Treatment Plan Summary: Daily contact with patient to assess and evaluate symptoms and progress in treatment, Medication management, Plan inpatient treatment  and medications as below Encourage group and milieu participation to  work on coping skills and symptom reduction Increase  Depakote ER to 500 mgrs QHS for mood disorder  Continue Ativan/Haldol PRNs per agitation protocol as needed  Continue Alcohol detox protocol to minimize risk of WDL. Treatment team working on obtaining collateral information and on disposition planning options  Jenne Campus, MD 01/03/2018, 4:29 PM   Patient ID: [name], male   DOB: 04-May-1961, 57 y.o.   MRN: [id number]

## 2018-01-03 NOTE — Progress Notes (Signed)
Pt presents with an animated affect and anxious mood. Pt appears to be hyper-religious this morning during shift assessment. Pt made religious comments each time writer asked a question. "I'm not depressed, I'm blessed to be alive. Why would I have anxiety, when I woke up this morning and I'm not on the other side of the grave. I'm blessed that I woke up this morning to see another day". Pt made it clear that he doesn't need to be here in the hospital but while he's here, he's getting emotional rest. 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 Pt stated that his wife is coming at 4 pm to meet with the Dr. Konrad Dolores informed tx team. Pt denies SI/HI. Pt denies depression and anxiety. Pt reports good sleep last night. Pt refused scheduled vitamins. Pt denies any withdrawal symptoms from etoh. No withdrawal symptoms observed. Orders reviewed with pt. Verbal support provided. Pt encouraged to attend groups. 15 minute checks performed for safety. Pt compliant with scheduled meds and denies any side effects.

## 2018-01-03 NOTE — Progress Notes (Signed)
Recreation Therapy Notes  Date: 5.1.19 Time: 0930 Location: [address] Dayroom  Group Topic: Stress Management  Goal Area(s) Addresses:  Patient will verbalize importance of using healthy stress management.  Patient will identify positive emotions associated with healthy stress management.   Behavioral Response: Engaged  Intervention: Stress Management  Activity :  Progressive Muscle Relaxation.  LRT introduced the stress management technique of PMR.  LRT led group in tensing and then relaxing each muscle.  Patients followed along to engage in activity.  Education:  Stress Management, Discharge Planning.   Education Outcome: Acknowledges edcuation/In group clarification offered/Needs additional education  Clinical Observations/Feedback: Pt attended and participated in group.    Victorino Sparrow, LRT/CTRS         Ria Comment, Ivelisse Culverhouse A 01/03/2018 11:20 AM

## 2018-01-03 NOTE — Progress Notes (Signed)
Adult Psychoeducational Group Note  Date:  01/03/2018 Time:  9:47 PM  Group Topic/Focus:  Wrap-Up Group:   The focus of this group is to help patients review their daily goal of treatment and discuss progress on daily workbooks.  Participation Level:  Active  Participation Quality:  Appropriate  Affect:  Appropriate  Cognitive:  Appropriate  Insight: Appropriate  Engagement in Group:  Engaged  Modes of Intervention:  Activity  Additional Comments:  Pt rated his day a 49 and goal is to prepare for discharge and rest emotionally.  Scott Doyle 01/03/2018, 9:47 PM

## 2018-01-03 NOTE — Progress Notes (Signed)
Inpatient Diabetes Program Recommendations  AACE/ADA: New Consensus Statement on Inpatient Glycemic Control (2015)  Target Ranges:  Prepandial:   less than 140 mg/dL      Peak postprandial:   less than 180 mg/dL (1-2 hours)      Critically ill patients:  140 - 180 mg/dL   Results for Scott Doyle, Scott Doyle (MRN [id number]) as of 01/03/2018 09:22  Ref. Range 01/02/2018 05:49 01/02/2018 17:02  Glucose-Capillary Latest Ref Range: 65 - 99 mg/dL 266 (H) 237 (H)   Results for Scott Doyle, Scott Doyle (MRN [id number]) as of 01/03/2018 09:22  Ref. Range 01/03/2018 06:07  Glucose-Capillary Latest Ref Range: 65 - 99 mg/dL 220 (H)      Admit: Bipolar and Schizoaffective Disorder/ IVC  History: DM Type 2  Home DM Meds: Metformin 1000 mg BID        Glipizide 2.5 mg daily  Current Orders: Metformin 1000 mg BID       Glipizide 2.5 mg daily      MD- Please consider ordering Novolog Sensitive Correction Scale/ SSI (0-9 units) TID AC + HS  (Use Glycemic Control Order set)      --Will follow patient during hospitalization--  Wyn Quaker RN, MSN, CDE Diabetes Coordinator Inpatient Glycemic Control Team Team Pager: [phone] (8a-5p)

## 2018-01-03 NOTE — [facility] Group Notes (Signed)
[facility] Group Therapy 01/03/2018 1:15pm  Type of Therapy: Mental Health Association Presentation  Participation Level: Active  Participation Quality: Attentive  Affect: Appropriate  Cognitive: Oriented  Insight: Developing/Improving  Engagement in Therapy: Engaged  Modes of Intervention: Discussion, Education and Socialization  Summary of Progress/Problems: [place] ([place]) Speaker came to talk about his personal journey with mental health. The pt processed ways by which to relate to the speaker. [place] speaker provided handouts and educational information pertaining to groups and services offered by the [facility]. Pt was engaged in speaker's presentation and was receptive to resources provided.    Joanne Chars, LCSW 01/03/2018 4:02 PM

## 2018-01-04 LAB — GLUCOSE, CAPILLARY
GLUCOSE-CAPILLARY: 181 mg/dL — AB (ref 65–99)
GLUCOSE-CAPILLARY: 272 mg/dL — AB (ref 65–99)
Glucose-Capillary: 222 mg/dL — ABNORMAL HIGH (ref 65–99)

## 2018-01-04 NOTE — Plan of Care (Signed)
  Problem: Safety: Goal: Periods of time without injury will increase Outcome: Progressing   Problem: Self-Concept: Goal: Will verbalize positive feelings about self Outcome: Progressing   Problem: Health Behavior/Discharge Planning: Goal: Compliance with therapeutic regimen will improve Outcome: Progressing DAR NOTE: Patient presents with calm affect and pleasant mood.  Denies suicidal thoughts, pain, auditory and visual hallucinations.  Described energy level as normal and concentration as good.  Rates depression at 0 hopelessness at 0, and anxiety at 0.  Maintained on routine safety checks.  Medications given as prescribed.  Support and encouragement offered as needed.  Attended group and participated.  States goal for today is "home going."  Patient visible in milieu interacting with peers.  Offered no complaint.
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
## 2018-01-04 NOTE — Progress Notes (Addendum)
Inpatient Diabetes Program Recommendations  AACE/ADA: New Consensus Statement on Inpatient Glycemic Control (2015)  Target Ranges:  Prepandial:   less than 140 mg/dL      Peak postprandial:   less than 180 mg/dL (1-2 hours)      Critically ill patients:  140 - 180 mg/dL   Results for JOHNATHEN, TESTA (MRN [id number]) as of 01/04/2018 07:49  Ref. Range 01/03/2018 06:07 01/03/2018 17:00  Glucose-Capillary Latest Ref Range: 65 - 99 mg/dL 220 (H) 265 (H)   Results for AUGUSTA, HILBERT (MRN [id number]) as of 01/04/2018 07:49  Ref. Range 01/04/2018 05:48  Glucose-Capillary Latest Ref Range: 65 - 99 mg/dL 222 (H)     Admit: Bipolar and Schizoaffective Disorder/ IVC  History: DM Type 2  Home DM Meds: Metformin 1000 mg BID                              Glipizide 2.5 mg daily  Current Orders: Metformin 1000 mg BID                             Glipizide 2.5 mg daily      MD- Please consider ordering Novolog Sensitive Correction Scale/ SSI (0-9 units) TID AC + HS while patient hospitalized  (Use Glycemic Control Order set)  May also consider increasing Glipizide to 5 mg daily     --Will follow patient during hospitalization--  Wyn Quaker RN, MSN, CDE Diabetes Coordinator Inpatient Glycemic Control Team Team Pager: [phone] (8a-5p)

## 2018-01-04 NOTE — BHH Group Notes (Signed)
[place] LCSW Group Therapy Note  Date/Time: 01/04/18, 1315  Type of Therapy/Topic:  Group Therapy:  Balance in Life  Participation Level:  minimal  Description of Group:    This group will address the concept of balance and how it feels and looks when one is unbalanced. Patients will be encouraged to process areas in their lives that are out of balance, and identify reasons for remaining unbalanced. Facilitators will guide patients utilizing problem- solving interventions to address and correct the stressor making their life unbalanced. Understanding and applying boundaries will be explored and addressed for obtaining  and maintaining a balanced life. Patients will be encouraged to explore ways to assertively make their unbalanced needs known to significant others in their lives, using other group members and facilitator for support and feedback.  Therapeutic Goals: 1. Patient will identify two or more emotions or situations they have that consume much of in their lives. 2. Patient will identify signs/triggers that life has become out of balance:  3. Patient will identify two ways to set boundaries in order to achieve balance in their lives:  4. Patient will demonstrate ability to communicate their needs through discussion and/or role plays  Summary of Patient Progress:Pt shared that family is out of balance in his life but did not share anything else or participate in the group discussion.          Therapeutic Modalities:   Cognitive Behavioral Therapy Solution-Focused Therapy Assertiveness Training  Lurline Idol, [place]

## 2018-01-04 NOTE — BHH Group Notes (Signed)
Adult Psychoeducational Group Note  Date:  01/04/2018 Time:  10:40 PM  Group Topic/Focus:  Wrap-Up Group:   The focus of this group is to help patients review their daily goal of treatment and discuss progress on daily workbooks.  Participation Level:  Active  Participation Quality:  Appropriate and Attentive  Affect:  Appropriate  Cognitive:  Alert and Appropriate  Insight: Appropriate and Good  Engagement in Group:  Engaged  Modes of Intervention:  Discussion and Education  Additional Comments:  Pt attended and participated in wrap up group this evening. Pt had an excellent day, due to them working on their emotional stress. Pt goal was to work on "emotional rest", so they had a lot of much needed sleep.   Cristi Loron 01/04/2018, 10:40 PM

## 2018-01-05 LAB — CBC WITH DIFFERENTIAL/PLATELET
BASOS PCT: 0 %
Basophils Absolute: 0 10*3/uL (ref 0.0–0.1)
EOS ABS: 0.1 10*3/uL (ref 0.0–0.7)
EOS PCT: 3 %
HCT: 44.3 % (ref 39.0–52.0)
HEMOGLOBIN: 15.2 g/dL (ref 13.0–17.0)
LYMPHS ABS: 1.9 10*3/uL (ref 0.7–4.0)
Lymphocytes Relative: 38 %
MCH: 29.7 pg (ref 26.0–34.0)
MCHC: 34.3 g/dL (ref 30.0–36.0)
MCV: 86.7 fL (ref 78.0–100.0)
MONO ABS: 0.3 10*3/uL (ref 0.1–1.0)
Monocytes Relative: 6 %
Neutro Abs: 2.6 10*3/uL (ref 1.7–7.7)
Neutrophils Relative %: 53 %
PLATELETS: 135 10*3/uL — AB (ref 150–400)
RBC: 5.11 MIL/uL (ref 4.22–5.81)
RDW: 13.1 % (ref 11.5–15.5)
WBC: 4.9 10*3/uL (ref 4.0–10.5)

## 2018-01-05 LAB — HEPATIC FUNCTION PANEL
ALK PHOS: 74 U/L (ref 38–126)
ALT: 31 U/L (ref 17–63)
AST: 31 U/L (ref 15–41)
Albumin: 3.8 g/dL (ref 3.5–5.0)
BILIRUBIN INDIRECT: 0.6 mg/dL (ref 0.3–0.9)
Bilirubin, Direct: 0.1 mg/dL (ref 0.1–0.5)
TOTAL PROTEIN: 7.1 g/dL (ref 6.5–8.1)
Total Bilirubin: 0.7 mg/dL (ref 0.3–1.2)

## 2018-01-05 LAB — GLUCOSE, CAPILLARY: Glucose-Capillary: 232 mg/dL — ABNORMAL HIGH (ref 65–99)

## 2018-01-05 LAB — VALPROIC ACID LEVEL: Valproic Acid Lvl: 16 ug/mL — ABNORMAL LOW (ref 50.0–100.0)

## 2018-01-05 MED ORDER — DIVALPROEX SODIUM ER 500 MG PO TB24: 500.0000 mg | ORAL_TABLET | Freq: Every day | ORAL | 0 refills | Status: DC

## 2018-01-05 MED ORDER — DIVALPROEX SODIUM ER 250 MG PO TB24
750.0000 mg | ORAL_TABLET | Freq: Every day | ORAL | Status: DC
  Filled 2018-01-05 (×2): qty 3

## 2018-01-05 MED ORDER — DIVALPROEX SODIUM ER 250 MG PO TB24: 750.0000 mg | ORAL_TABLET | Freq: Every day | ORAL | 0 refills | Status: DC

## 2018-01-05 MED ORDER — TRAZODONE HCL 50 MG PO TABS: 50.0000 mg | ORAL_TABLET | Freq: Every evening | ORAL | 0 refills | Status: DC | PRN

## 2018-01-05 NOTE — Progress Notes (Signed)
Patient discharged to lobby. Patient was stable and appreciative at that time. All papers and prescriptions were given and valuables returned. Verbal understanding expressed. Denies SI/HI and A/VH. Patient given opportunity to express concerns and ask questions.  

## 2018-01-05 NOTE — Progress Notes (Signed)
  [facility] Adult Case Management Discharge Plan :  Will you be returning to the same living situation after discharge:  Yes,  with wife At discharge, do you have transportation home?: Yes,  cousin Do you have the ability to pay for your medications: Yes,  [facility] medicare  Release of information consent forms completed and in the chart;  Patient's signature needed at discharge.  Patient to Follow up at: Follow-up Information    [facility]. Go on 01/15/2018.   Specialty:  Behavioral Health Why:  Please attend your medication appt with Dr Shea Evans on Monday, 01/15/18, at 11:15am. Contact information: [place] [phone]          Next level of care provider has access to [place] and Suicide Prevention discussed: Yes,  with wife  Have you used any form of tobacco in the last 30 days? (Cigarettes, Smokeless Tobacco, Cigars, and/or Pipes): No  Has patient been referred to the Quitline?: N/A patient is not a smoker  Patient has been referred for addiction treatment: N/A  Joanne Chars, [place] 01/05/2018, 9:59 AM

## 2018-01-05 NOTE — Progress Notes (Signed)
Recreation Therapy Notes  Date: 5.3.19 Time: 0930 Location: [address] Dayroom  Group Topic: Stress Management  Goal Area(s) Addresses:  Patient will verbalize importance of using healthy stress management.  Patient will identify positive emotions associated with healthy stress management.   Intervention: Stress Management  Activity :  Meditation.  LRT played a meditation on the importance of humanity and how people interact with one another.  Patients were to listen and follow along as the meditation played.  Education:  Stress Management, Discharge Planning.   Education Outcome: Acknowledges edcuation/In group clarification offered/Needs additional education  Clinical Observations/Feedback: Patient did not attend group.     Victorino Sparrow, LRT/CTRS         Victorino Sparrow A 01/05/2018 11:32 AM
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
## 2018-01-05 NOTE — BHH Suicide Risk Assessment (Signed)
[facility] Discharge Suicide Risk Assessment   Principal Problem: History of Bipolar Disorder Discharge Diagnoses:  Patient Active Problem List   Diagnosis Date Noted  . Bipolar affective disorder ([place]) [F31.9]   . Schizoaffective disorder ([place]) [F25.9] 12/31/2017  . Anxiety [F41.9]   . Coronary artery disease [I25.10]   . Hyperlipidemia [E78.5]   . Hypertension [I10]     Total Time spent with patient: 30 minutes  Musculoskeletal: Strength & Muscle Tone: within normal limits Gait & Station: normal Patient leans: N/A  Psychiatric Specialty Exam: ROS denies headache, denies chest pain, no shortness of breath, no nausea or vomiting, no abdominal pain, no rash   Blood pressure 110/64, pulse 64, temperature 98 F (36.7 C), temperature source Oral, resp. rate (!) 30, height 5\' 5"  (1.651 m), weight 73 kg (161 lb).Body mass index is 26.79 kg/m.  General Appearance: Well Groomed  Eye Contact::  Good  Speech:  Normal Rate409  Volume:  Normal  Mood:  reports mood is " normal" and at this time presents euthymic  Affect:  Appropriate and Full Range- no irritability or expansiveness noted   Thought Process:  Linear and Descriptions of Associations: Intact  Orientation:  Full (Time, Place, and Person)  Thought Content:  no hallucinations, no delusions, not internally preoccupied  Suicidal Thoughts:  No denies any suicidal or self injurious ideations, denies any homicidal or violent ideations and also specifically denies any violent or homicidal ideations towards his sons   Homicidal Thoughts:  No  Memory:  recent and remote grossly intact   Judgement:  Other:  improving   Insight:  improving   Psychomotor Activity:  Normal  Concentration:  Good  Recall:  Good  Fund of Knowledge:Good  Language: Good  Akathisia:  Negative  Handed:  Right  AIMS (if indicated):     Assets:  Desire for Improvement Resilience Social Support  Sleep:  Number of Hours: 6.75  Cognition: WNL  ADL's:  Intact    Mental Status Per Nursing Assessment::   On Admission:     Demographic Factors:  57 year old married male, lives with wife, has adult sons .   Loss Factors: Relationship stressors with adult sons   Historical Factors: Family reported history of a prior psychiatric admission years ago for similar presentation, decompensation , prior diagnosis of Bipolar Disorder, and history of prior management with Depakote , but had been off psychiatric medications for years .  Risk Reduction Factors:   Sense of responsibility to family, Living with another person, especially a relative, Positive social support and Positive coping skills or problem solving skills  Continued Clinical Symptoms:  At this time patient presents alert, attentive, well related, calm, mood improved and currently presents euthymic, does not present irritable or expansive, thought process is linear, no grandiosity, no hyper-religiosity noted at this time, denies SI or HI, specifically also denies any violent or homicidal ideations towards sons , no hallucinations, no delusions, future oriented, looking forward to returning home, making a day trip to the beach, and working on restoring a piano he has been working on. We have reviewed medication side effects. He states he feels medication is helping.  Platelet count slightly decreased (135k) which may be related to Depakote management. Valproic Acid serum level subtherapeutic so will titrate dose further prior to discharge . Importance of ongoing outpatient psychiatric follow up and management stressed , patient aware of need for frequent, periodic blood draws for appropriate titration and management of Valproate . Behavior on unit in  good control, pleasant on approach.  Cognitive Features That Contribute To Risk:  No gross cognitive deficits noted upon discharge. Is alert , attentive, and oriented x 3   Suicide Risk:  Mild:  Suicidal ideation of limited frequency, intensity,  duration, and specificity.  There are no identifiable plans, no associated intent, mild dysphoria and related symptoms, good self-control (both objective and subjective assessment), few other risk factors, and identifiable protective factors, including available and accessible social support.  Follow-up Information    [facility]. Go on 01/15/2018.   Specialty:  Behavioral Health Why:  Please attend your medication appt with Dr Shea Evans on Monday, 01/15/18, at 11:15am. Contact information: [place] [phone]          Plan Of Care/Follow-up recommendations:  Activity:  inpatient treatment  Diet:  medications as below Tests:  NA Other:  See below  Patient is expressing readiness for discharge- there are no current grounds for ongoing involuntary commitment He is leaving unit in good spirits  Plans to return home, states family member will pick him up later today Plans to follow up as above, also has established PCP for management of medical issues as needed.   Jenne Campus, MD 01/05/2018, 11:00 AM

## 2018-01-05 NOTE — Progress Notes (Signed)
D: Patient denies SI, HI or AVH this evening. Patient presents as anxious and animated, stating that he is happy about his discharge tomorrow.  Pt. States that he was hoping to be discharged today but that didn't happen.  Pt. Is visualized interacting with staff and others on the unit.  Pt. Attended wrap up group tonight, was attentive and participated.  Pt. Denies any physical complaints.  A: Patient given emotional support from RN. Patient encouraged to come to staff with concerns and/or questions. Patient's medication routine continued. Patient's orders and plan of care reviewed.   R: Patient remains appropriate and cooperative. Will continue to monitor patient q15 minutes for safety.

## 2018-01-15 ENCOUNTER — Ambulatory Visit: Payer: Self-pay | Admitting: Psychiatry

## 2018-03-14 DIAGNOSIS — I517 Cardiomegaly: Secondary | ICD-10-CM | POA: Diagnosis not present

## 2018-03-14 DIAGNOSIS — J208 Acute bronchitis due to other specified organisms: Secondary | ICD-10-CM | POA: Diagnosis not present

## 2018-03-14 DIAGNOSIS — E119 Type 2 diabetes mellitus without complications: Secondary | ICD-10-CM | POA: Diagnosis not present

## 2018-03-14 DIAGNOSIS — M5127 Other intervertebral disc displacement, lumbosacral region: Secondary | ICD-10-CM | POA: Diagnosis not present

## 2018-03-27 ENCOUNTER — Other Ambulatory Visit: Payer: Self-pay | Admitting: Cardiovascular Disease

## 2018-04-22 ENCOUNTER — Other Ambulatory Visit: Payer: Self-pay | Admitting: Cardiovascular Disease

## 2018-05-11 ENCOUNTER — Ambulatory Visit (INDEPENDENT_AMBULATORY_CARE_PROVIDER_SITE_OTHER): Payer: Medicare HMO | Admitting: Nurse Practitioner

## 2018-05-11 ENCOUNTER — Encounter: Payer: Self-pay | Admitting: Nurse Practitioner

## 2018-05-11 VITALS — BP 118/62 | HR 58 | Ht 66.0 in | Wt 170.8 lb

## 2018-05-11 DIAGNOSIS — E119 Type 2 diabetes mellitus without complications: Secondary | ICD-10-CM | POA: Diagnosis not present

## 2018-05-11 DIAGNOSIS — E785 Hyperlipidemia, unspecified: Secondary | ICD-10-CM | POA: Diagnosis not present

## 2018-05-11 DIAGNOSIS — I10 Essential (primary) hypertension: Secondary | ICD-10-CM | POA: Diagnosis not present

## 2018-05-11 DIAGNOSIS — I2510 Atherosclerotic heart disease of native coronary artery without angina pectoris: Secondary | ICD-10-CM

## 2018-05-11 MED ORDER — ISOSORBIDE MONONITRATE ER 30 MG PO TB24: 90.0000 mg | ORAL_TABLET | Freq: Every day | ORAL | 3 refills | Status: DC

## 2018-05-11 MED ORDER — QUINAPRIL HCL 10 MG PO TABS: 10.0000 mg | ORAL_TABLET | Freq: Every day | ORAL | 3 refills | Status: DC

## 2018-05-11 MED ORDER — NITROGLYCERIN 0.4 MG SL SUBL: 0.4000 mg | SUBLINGUAL_TABLET | SUBLINGUAL | 6 refills | Status: DC | PRN

## 2018-05-11 MED ORDER — ATENOLOL 25 MG PO TABS: 25.0000 mg | ORAL_TABLET | Freq: Every day | ORAL | 3 refills | Status: DC

## 2018-05-11 MED ORDER — ROSUVASTATIN CALCIUM 40 MG PO TABS: 40.0000 mg | ORAL_TABLET | Freq: Every day | ORAL | 3 refills | Status: DC

## 2018-05-11 NOTE — Patient Instructions (Signed)
Medication Instructions:  Your physician recommends that you continue on your current medications as directed. Please refer to the Current Medication list given to you today.   Labwork: none  Testing/Procedures: none  Follow-Up: Your physician recommends that you schedule a follow-up appointment in: [place].   If you need a refill on your cardiac medications before your next appointment, please call your pharmacy.

## 2018-05-11 NOTE — Progress Notes (Signed)
Office Visit    Patient Name: Scott Doyle Date of Encounter: 05/11/2018  Primary Care Provider:  Cletis Athens, MD Primary Cardiologist:  Kathlyn Sacramento, MD  Chief Complaint    57 year old male who presents for follow-up today related to his history of CAD status post CABG x1, hypertension, hyperlipidemia, and diabetes.  Past Medical History    Past Medical History:  Diagnosis Date  . Anxiety   . Coronary artery disease    a. s/p prior CABG x 1 (LIMA->LAD) and PCI to the D2; b. 01/2012 PCI: patent prox Diag stent w/ sev distal dzs->DES x 1; c. 08/2014 Cath: LM nl, LAD 28m, D2 patent stents w/ 32m, LCX nl, RCA 40p, LIMA->LAD nl, EF 60%.  . Diabetes mellitus   . Hyperlipidemia   . Hypertension   . Thrombocytopathia [facility])    Past Surgical History:  Procedure Laterality Date  . CARDIAC CATHETERIZATION    . CARDIAC CATHETERIZATION  08/2014   Patent LIMA to LAD and patent diagonal stents.  . CORONARY ARTERY BYPASS GRAFT  2001   [facility]    Allergies  Allergies  Allergen Reactions  . Ampicillin Hives  . Penicillins Hives and Swelling    History of Present Illness    57 year old male with the above past medical history including CAD status post remote CABG x1 with placement of the LIMA to the LAD.  He subsequently underwent PCI to the 2nd diagonal with repeat PCI in May 2013 secondary to more distal disease requiring drug-eluting stent placement.  His last catheterization was in December 2015 revealing patent diagonal stents.  He has since undergone treadmill stress testing in 2018, which was reportedly normal.  Other history includes hypertension, hyperlipidemia, anxiety, and type 2 diabetes mellitus.  He was last seen here in June 2018.  Since then, he reports that he has been doing relatively well.  He is fairly active in his yard and denies exertional chest pain or pressure but does sometimes have chest wall soreness.  He thinks this is different than prior angina and is more  likely to be musculoskeletal as there is some tenderness as well.  This does not typically limit his activities.  He denies dyspnea, palpitations, PND, orthopnea, dizziness, syncope, edema, or early satiety.  Home Medications    Prior to Admission medications   Medication Sig Start Date End Date Taking? Authorizing Provider  aspirin 81 MG tablet Take 81 mg by mouth daily.   Yes [provider]  atenolol (TENORMIN) 50 MG tablet Take 25 mg by mouth daily.   Yes [provider]  cholecalciferol (VITAMIN D) 1000 units tablet Take 1,000 Units by mouth daily. 03/20/18  Yes [provider]  gabapentin (NEURONTIN) 300 MG capsule Take 300 mg by mouth 2 (two) times daily. 04/07/18  Yes [provider]  glipiZIDE (GLUCOTROL XL) 2.5 MG 24 hr tablet Take 2.5 mg by mouth daily. 08/18/14  Yes [provider]  isosorbide mononitrate (IMDUR) 30 MG 24 hr tablet Take 3 tablets by mouth Daily. 11/07/11  Yes [provider]  metFORMIN (GLUCOPHAGE) 1000 MG tablet Take 1 tablet by mouth Twice daily. 11/07/11  Yes [provider]  nitroGLYCERIN (NITROSTAT) 0.4 MG SL tablet Place 0.4 mg under the tongue every 5 (five) minutes as needed.   Yes [provider]  quinapril (ACCUPRIL) 10 MG tablet Take 1 tablet by mouth Daily. 11/07/11  Yes [provider]  ranitidine (ZANTAC) 150 MG tablet Take 1 tablet by mouth  Twice daily. 11/07/11  Yes [provider]  rosuvastatin (CRESTOR) 40 MG tablet TAKE 1 TABLET BY MOUTH EVERY DAY 04/23/18  Yes Wellington Hampshire, MD  Tamsulosin HCl (FLOMAX) 0.4 MG CAPS Take 1 tablet by mouth Daily. 11/07/11  Yes [provider]  LANTUS SOLOSTAR 100 UNIT/ML Solostar Pen INJECT 10 UNITS DAILY 03/31/18   [provider]    Review of Systems    Chest wall soreness when he over exerts or following lifting heavy items.  He denies angina, dyspnea, PND, orthopnea, palpitations, edema, dizziness, syncope, or early  satiety.  All other systems reviewed and are otherwise negative except as noted above.  Physical Exam    VS:  BP 118/62 (BP Location: Left Arm, Patient Position: Sitting, Cuff Size: Normal)   Pulse (!) 58   Ht 5\' 6"  (1.676 m)   Wt 170 lb 12 oz (77.5 kg)   BMI 27.56 kg/m  , BMI Body mass index is 27.56 kg/m. GEN: Well nourished, well developed, in no acute distress. HEENT: normal. Neck: Supple, no JVD, carotid bruits, or masses. Cardiac: RRR, no murmurs, rubs, or gallops. No clubbing, cyanosis, edema.  Radials/DP/PT 2+ and equal bilaterally.  Respiratory:  Respirations regular and unlabored, clear to auscultation bilaterally. GI: Soft, nontender, nondistended, BS + x 4. MS: no deformity or atrophy. Skin: warm and dry, no rash. Neuro:  Strength and sensation are intact. Psych: Normal affect.  Accessory Clinical Findings    ECG personally reviewed by me today -sinus bradycardia, 58, no acute ST or T changes.  Lab Results  Component Value Date   CREATININE 0.92 12/31/2017   BUN 11 12/31/2017   NA 139 12/31/2017   K 3.8 12/31/2017   CL 105 12/31/2017   CO2 25 12/31/2017    Lab Results  Component Value Date   ALT 31 01/05/2018   AST 31 01/05/2018   ALKPHOS 74 01/05/2018   BILITOT 0.7 01/05/2018   Lab Results  Component Value Date   CHOL 139 09/14/2016   HDL 30 (L) 09/14/2016   LDLCALC 87 09/14/2016   TRIG 108 09/14/2016   CHOLHDL 4.6 09/14/2016    Assessment & Plan    1.  Coronary artery disease: Status post prior CABG x1 and diagonal stenting x2.  Last cath in 2015 showed stable anatomy.  He has remained stable over the past year, occasionally noting musculoskeletal chest discomfort but denying angina or dyspnea.  He has not required any nitroglycerin.  Heart rate and blood pressure stable today.  I will refill his medications today including aspirin, beta-blocker, nitrate, ACE inhibitor, and statin therapy, and plan for follow-up in 1 year.  2.  Essential  hypertension: Stable on beta-blocker and ACE inhibitor therapy.  3.  Hyperlipidemia: Last LDL that we have documented was in January 2018 and was 87.  In June of last year he was switched from atorvastatin to rosuvastatin.  He has tolerated this well.  He thinks that he had lipids checked with his primary care provider but is not sure.  He has primary care follow-up soon and will request for lipids to be drawn if not already drawn this year.  LFTs were normal earlier this year.  Continue rosuvastatin.  4.  Type 2 diabetes mellitus: Followed by primary care.  Last A1c we have on record was from January 2018, at which time it was 9.1.  He is not sure if this is improved or not.  He remains on metformin and glipizide therapy.  5.  Disposition: He will contact us if we need to follow-up his lipids.  Otherwise plan for follow-up in 1 year or sooner if necessary.  Murray Hodgkins, NP 05/11/2018, 9:36 AM

## 2018-05-11 NOTE — Addendum Note (Signed)
Addended by: Alba Destine on: 05/11/2018 11:38 AM   Modules accepted: Orders

## 2018-07-19 ENCOUNTER — Other Ambulatory Visit: Payer: Self-pay | Admitting: Internal Medicine

## 2018-07-19 DIAGNOSIS — K77 Liver disorders in diseases classified elsewhere: Secondary | ICD-10-CM | POA: Diagnosis not present

## 2018-07-19 DIAGNOSIS — I517 Cardiomegaly: Secondary | ICD-10-CM | POA: Diagnosis not present

## 2018-07-19 DIAGNOSIS — R160 Hepatomegaly, not elsewhere classified: Secondary | ICD-10-CM

## 2018-07-19 DIAGNOSIS — E119 Type 2 diabetes mellitus without complications: Secondary | ICD-10-CM | POA: Diagnosis not present

## 2018-07-19 DIAGNOSIS — Z0000 Encounter for general adult medical examination without abnormal findings: Secondary | ICD-10-CM | POA: Diagnosis not present

## 2018-07-19 DIAGNOSIS — M5127 Other intervertebral disc displacement, lumbosacral region: Secondary | ICD-10-CM | POA: Diagnosis not present

## 2018-07-23 ENCOUNTER — Other Ambulatory Visit: Payer: Self-pay | Admitting: Internal Medicine

## 2018-07-23 ENCOUNTER — Ambulatory Visit
Admission: RE | Admit: 2018-07-23 | Discharge: 2018-07-23 | Disposition: A | Discharge status: Home / Self Care | Payer: Medicare HMO | Source: Ambulatory Visit | Attending: Internal Medicine | Admitting: Internal Medicine

## 2018-07-23 DIAGNOSIS — R161 Splenomegaly, not elsewhere classified: Secondary | ICD-10-CM | POA: Diagnosis not present

## 2018-07-23 DIAGNOSIS — I517 Cardiomegaly: Secondary | ICD-10-CM

## 2018-07-23 DIAGNOSIS — R0989 Other specified symptoms and signs involving the circulatory and respiratory systems: Secondary | ICD-10-CM | POA: Diagnosis not present

## 2018-07-23 DIAGNOSIS — R160 Hepatomegaly, not elsewhere classified: Secondary | ICD-10-CM

## 2018-07-24 DIAGNOSIS — M5127 Other intervertebral disc displacement, lumbosacral region: Secondary | ICD-10-CM | POA: Diagnosis not present

## 2018-07-24 DIAGNOSIS — E118 Type 2 diabetes mellitus with unspecified complications: Secondary | ICD-10-CM | POA: Diagnosis not present

## 2018-07-24 DIAGNOSIS — I517 Cardiomegaly: Secondary | ICD-10-CM | POA: Diagnosis not present

## 2018-07-24 DIAGNOSIS — I119 Hypertensive heart disease without heart failure: Secondary | ICD-10-CM | POA: Diagnosis not present

## 2018-12-17 ENCOUNTER — Ambulatory Visit
Admission: RE | Admit: 2018-12-17 | Discharge: 2018-12-17 | Disposition: A | Discharge status: Home / Self Care | Payer: Medicare HMO | Source: Ambulatory Visit | Attending: Cardiology | Admitting: Cardiology

## 2018-12-17 ENCOUNTER — Other Ambulatory Visit: Payer: Self-pay | Admitting: Internal Medicine

## 2018-12-17 ENCOUNTER — Other Ambulatory Visit: Payer: Self-pay

## 2018-12-17 ENCOUNTER — Ambulatory Visit
Admission: RE | Admit: 2018-12-17 | Discharge: 2018-12-17 | Disposition: A | Discharge status: Home / Self Care | Payer: Medicare HMO | Source: Ambulatory Visit | Attending: Internal Medicine | Admitting: Internal Medicine

## 2018-12-17 DIAGNOSIS — I25810 Atherosclerosis of coronary artery bypass graft(s) without angina pectoris: Secondary | ICD-10-CM | POA: Diagnosis not present

## 2018-12-17 DIAGNOSIS — E119 Type 2 diabetes mellitus without complications: Secondary | ICD-10-CM | POA: Diagnosis not present

## 2018-12-17 DIAGNOSIS — R079 Chest pain, unspecified: Secondary | ICD-10-CM

## 2018-12-17 DIAGNOSIS — E785 Hyperlipidemia, unspecified: Secondary | ICD-10-CM | POA: Diagnosis not present

## 2018-12-19 DIAGNOSIS — E119 Type 2 diabetes mellitus without complications: Secondary | ICD-10-CM | POA: Diagnosis not present

## 2018-12-19 DIAGNOSIS — I2510 Atherosclerotic heart disease of native coronary artery without angina pectoris: Secondary | ICD-10-CM | POA: Diagnosis not present

## 2018-12-19 DIAGNOSIS — I25810 Atherosclerosis of coronary artery bypass graft(s) without angina pectoris: Secondary | ICD-10-CM | POA: Diagnosis not present

## 2018-12-19 DIAGNOSIS — E785 Hyperlipidemia, unspecified: Secondary | ICD-10-CM | POA: Diagnosis not present

## 2018-12-20 DIAGNOSIS — R079 Chest pain, unspecified: Secondary | ICD-10-CM | POA: Diagnosis not present

## 2018-12-20 DIAGNOSIS — I2510 Atherosclerotic heart disease of native coronary artery without angina pectoris: Secondary | ICD-10-CM | POA: Diagnosis not present

## 2018-12-20 DIAGNOSIS — E119 Type 2 diabetes mellitus without complications: Secondary | ICD-10-CM | POA: Diagnosis not present

## 2018-12-20 DIAGNOSIS — I25810 Atherosclerosis of coronary artery bypass graft(s) without angina pectoris: Secondary | ICD-10-CM | POA: Diagnosis not present

## 2018-12-20 DIAGNOSIS — E785 Hyperlipidemia, unspecified: Secondary | ICD-10-CM | POA: Diagnosis not present

## 2018-12-21 DIAGNOSIS — I25810 Atherosclerosis of coronary artery bypass graft(s) without angina pectoris: Secondary | ICD-10-CM | POA: Diagnosis not present

## 2018-12-21 DIAGNOSIS — E119 Type 2 diabetes mellitus without complications: Secondary | ICD-10-CM | POA: Diagnosis not present

## 2018-12-21 DIAGNOSIS — R079 Chest pain, unspecified: Secondary | ICD-10-CM | POA: Diagnosis not present

## 2018-12-21 DIAGNOSIS — E785 Hyperlipidemia, unspecified: Secondary | ICD-10-CM | POA: Diagnosis not present

## 2018-12-21 DIAGNOSIS — I2510 Atherosclerotic heart disease of native coronary artery without angina pectoris: Secondary | ICD-10-CM | POA: Diagnosis not present

## 2019-01-04 DIAGNOSIS — E1140 Type 2 diabetes mellitus with diabetic neuropathy, unspecified: Secondary | ICD-10-CM | POA: Diagnosis not present

## 2019-01-04 DIAGNOSIS — E785 Hyperlipidemia, unspecified: Secondary | ICD-10-CM | POA: Diagnosis not present

## 2019-01-04 DIAGNOSIS — I119 Hypertensive heart disease without heart failure: Secondary | ICD-10-CM | POA: Diagnosis not present

## 2019-01-04 DIAGNOSIS — I2510 Atherosclerotic heart disease of native coronary artery without angina pectoris: Secondary | ICD-10-CM | POA: Diagnosis not present

## 2019-02-04 DIAGNOSIS — I119 Hypertensive heart disease without heart failure: Secondary | ICD-10-CM | POA: Diagnosis not present

## 2019-02-04 DIAGNOSIS — I2510 Atherosclerotic heart disease of native coronary artery without angina pectoris: Secondary | ICD-10-CM | POA: Diagnosis not present

## 2019-02-04 DIAGNOSIS — E1140 Type 2 diabetes mellitus with diabetic neuropathy, unspecified: Secondary | ICD-10-CM | POA: Diagnosis not present

## 2019-02-04 DIAGNOSIS — E785 Hyperlipidemia, unspecified: Secondary | ICD-10-CM | POA: Diagnosis not present

## 2019-03-13 DIAGNOSIS — F329 Major depressive disorder, single episode, unspecified: Secondary | ICD-10-CM | POA: Diagnosis not present

## 2019-03-13 DIAGNOSIS — I25810 Atherosclerosis of coronary artery bypass graft(s) without angina pectoris: Secondary | ICD-10-CM | POA: Diagnosis not present

## 2019-03-13 DIAGNOSIS — K219 Gastro-esophageal reflux disease without esophagitis: Secondary | ICD-10-CM | POA: Diagnosis not present

## 2019-03-13 DIAGNOSIS — M1712 Unilateral primary osteoarthritis, left knee: Secondary | ICD-10-CM | POA: Diagnosis not present

## 2019-05-15 DIAGNOSIS — E119 Type 2 diabetes mellitus without complications: Secondary | ICD-10-CM | POA: Diagnosis not present

## 2019-05-15 DIAGNOSIS — I25810 Atherosclerosis of coronary artery bypass graft(s) without angina pectoris: Secondary | ICD-10-CM | POA: Diagnosis not present

## 2019-05-15 DIAGNOSIS — F329 Major depressive disorder, single episode, unspecified: Secondary | ICD-10-CM | POA: Diagnosis not present

## 2019-05-15 DIAGNOSIS — K219 Gastro-esophageal reflux disease without esophagitis: Secondary | ICD-10-CM | POA: Diagnosis not present

## 2019-06-24 DIAGNOSIS — D229 Melanocytic nevi, unspecified: Secondary | ICD-10-CM | POA: Diagnosis not present

## 2019-06-24 DIAGNOSIS — I2510 Atherosclerotic heart disease of native coronary artery without angina pectoris: Secondary | ICD-10-CM | POA: Diagnosis not present

## 2019-06-24 DIAGNOSIS — E1140 Type 2 diabetes mellitus with diabetic neuropathy, unspecified: Secondary | ICD-10-CM | POA: Diagnosis not present

## 2019-06-24 DIAGNOSIS — E119 Type 2 diabetes mellitus without complications: Secondary | ICD-10-CM | POA: Diagnosis not present

## 2019-07-08 DIAGNOSIS — I25810 Atherosclerosis of coronary artery bypass graft(s) without angina pectoris: Secondary | ICD-10-CM | POA: Diagnosis not present

## 2019-07-08 DIAGNOSIS — E119 Type 2 diabetes mellitus without complications: Secondary | ICD-10-CM | POA: Diagnosis not present

## 2019-07-08 DIAGNOSIS — E785 Hyperlipidemia, unspecified: Secondary | ICD-10-CM | POA: Diagnosis not present

## 2019-07-08 DIAGNOSIS — I25119 Atherosclerotic heart disease of native coronary artery with unspecified angina pectoris: Secondary | ICD-10-CM | POA: Diagnosis not present

## 2019-07-08 DIAGNOSIS — I517 Cardiomegaly: Secondary | ICD-10-CM | POA: Diagnosis not present

## 2019-08-09 DIAGNOSIS — E119 Type 2 diabetes mellitus without complications: Secondary | ICD-10-CM | POA: Diagnosis not present

## 2019-08-09 DIAGNOSIS — K77 Liver disorders in diseases classified elsewhere: Secondary | ICD-10-CM | POA: Diagnosis not present

## 2019-08-09 DIAGNOSIS — E034 Atrophy of thyroid (acquired): Secondary | ICD-10-CM | POA: Diagnosis not present

## 2019-08-09 DIAGNOSIS — I10 Essential (primary) hypertension: Secondary | ICD-10-CM | POA: Diagnosis not present

## 2019-08-14 DIAGNOSIS — E785 Hyperlipidemia, unspecified: Secondary | ICD-10-CM | POA: Diagnosis not present

## 2019-08-14 DIAGNOSIS — E1140 Type 2 diabetes mellitus with diabetic neuropathy, unspecified: Secondary | ICD-10-CM | POA: Diagnosis not present

## 2019-08-14 DIAGNOSIS — Z0000 Encounter for general adult medical examination without abnormal findings: Secondary | ICD-10-CM | POA: Diagnosis not present

## 2019-08-14 DIAGNOSIS — I25810 Atherosclerosis of coronary artery bypass graft(s) without angina pectoris: Secondary | ICD-10-CM | POA: Diagnosis not present

## 2019-08-14 DIAGNOSIS — I517 Cardiomegaly: Secondary | ICD-10-CM | POA: Diagnosis not present

## 2019-09-12 DIAGNOSIS — I517 Cardiomegaly: Secondary | ICD-10-CM | POA: Diagnosis not present

## 2019-09-12 DIAGNOSIS — I119 Hypertensive heart disease without heart failure: Secondary | ICD-10-CM | POA: Diagnosis not present

## 2019-09-12 DIAGNOSIS — E1140 Type 2 diabetes mellitus with diabetic neuropathy, unspecified: Secondary | ICD-10-CM | POA: Diagnosis not present

## 2019-09-12 DIAGNOSIS — K219 Gastro-esophageal reflux disease without esophagitis: Secondary | ICD-10-CM | POA: Diagnosis not present

## 2019-09-13 ENCOUNTER — Ambulatory Visit: Payer: Medicare Other | Attending: Internal Medicine

## 2019-09-13 DIAGNOSIS — Z20822 Contact with and (suspected) exposure to covid-19: Secondary | ICD-10-CM | POA: Diagnosis not present

## 2019-09-14 LAB — NOVEL CORONAVIRUS, NAA: SARS-CoV-2, NAA: NOT DETECTED

## 2019-09-20 ENCOUNTER — Other Ambulatory Visit: Payer: Self-pay

## 2019-09-20 ENCOUNTER — Ambulatory Visit (INDEPENDENT_AMBULATORY_CARE_PROVIDER_SITE_OTHER): Payer: Medicare Other | Admitting: Physician Assistant

## 2019-09-20 ENCOUNTER — Encounter: Payer: Self-pay | Admitting: Physician Assistant

## 2019-09-20 VITALS — BP 140/70 | HR 61 | Ht 66.5 in | Wt 177.5 lb

## 2019-09-20 DIAGNOSIS — I10 Essential (primary) hypertension: Secondary | ICD-10-CM

## 2019-09-20 DIAGNOSIS — I2510 Atherosclerotic heart disease of native coronary artery without angina pectoris: Secondary | ICD-10-CM

## 2019-09-20 DIAGNOSIS — E785 Hyperlipidemia, unspecified: Secondary | ICD-10-CM | POA: Diagnosis not present

## 2019-09-20 DIAGNOSIS — E119 Type 2 diabetes mellitus without complications: Secondary | ICD-10-CM

## 2019-09-20 MED ORDER — QUINAPRIL HCL 10 MG PO TABS: 40.0000 mg | ORAL_TABLET | Freq: Every day | ORAL | 3 refills | Status: DC

## 2019-09-20 NOTE — Patient Instructions (Signed)
Medication Instructions:  No medication changes. *If you need a refill on your cardiac medications before your next appointment, please call your pharmacy*  Lab Work: None ordered If you have labs (blood work) drawn today and your tests are completely normal, you will receive your results only by: Marland Kitchen MyChart Message (if you have MyChart) OR . A paper copy in the mail If you have any lab test that is abnormal or we need to change your treatment, we will call you to review the results.  Testing/Procedures: None ordered  Follow-Up: At [facility], you and your health needs are our priority.  As part of our continuing mission to provide you with exceptional heart care, we have created designated Provider Care Teams.  These Care Teams include your primary Cardiologist (physician) and Advanced Practice Providers (APPs -  Physician Assistants and Nurse Practitioners) who all work together to provide you with the care you need, when you need it.  Your next appointment:   6 month(s)  The format for your next appointment:   In Person  Provider:   Kathlyn Sacramento, MD  Retta Mac  Other Instructions  How to Take Your Blood Pressure You can take your blood pressure at home with a machine. You may need to check your blood pressure at home:  To check if you have high blood pressure (hypertension).  To check your blood pressure over time.  To make sure your blood pressure medicine is working. Supplies needed: You will need a blood pressure machine, or monitor. You can buy one at a drugstore or online. When choosing one:  Choose one with an arm cuff.  Choose one that wraps around your upper arm. Only one finger should fit between your arm and the cuff.  Do not choose one that measures your blood pressure from your wrist or finger. Your doctor can suggest a monitor. How to prepare Avoid these things for 30 minutes before checking your blood pressure:  Drinking caffeine.  Drinking  alcohol.  Eating.  Smoking.  Exercising. Five minutes before checking your blood pressure:  Pee.  Sit in a dining chair. Avoid sitting in a soft couch or armchair.  Be quiet. Do not talk. How to take your blood pressure Follow the instructions that came with your machine. If you have a digital blood pressure monitor, these may be the instructions: 1. Sit up straight. 2. Place your feet on the floor. Do not cross your ankles or legs. 3. Rest your left arm at the level of your heart. You may rest it on a table, desk, or chair. 4. Pull up your shirt sleeve. 5. Wrap the blood pressure cuff around the upper part of your left arm. The cuff should be 1 inch (2.5 cm) above your elbow. It is best to wrap the cuff around bare skin. 6. Fit the cuff snugly around your arm. You should be able to place only one finger between the cuff and your arm. 7. Put the cord inside the groove of your elbow. 8. Press the power button. 9. Sit quietly while the cuff fills with air and loses air. 10. Write down the numbers on the screen. 11. Wait 2-3 minutes and then repeat steps 1-10. What do the numbers mean? Two numbers make up your blood pressure. The first number is called systolic pressure. The second is called diastolic pressure. An example of a blood pressure reading is "120 over 80" (or 120/80). If you are an adult and do not have a  medical condition, use this guide to find out if your blood pressure is normal: Normal  First number: below 120.  Second number: below 80. Elevated  First number: 120-129.  Second number: below 80. Hypertension stage 1  First number: 130-139.  Second number: 80-89. Hypertension stage 2  First number: 140 or above.  Second number: 59 or above. Your blood pressure is above normal even if only the top or bottom number is above normal. Follow these instructions at home:  Check your blood pressure as often as your doctor tells you to.  Take your monitor to  your next doctor's appointment. Your doctor will: ? Make sure you are using it correctly. ? Make sure it is working right.  Make sure you understand what your blood pressure numbers should be.  Tell your doctor if your medicines are causing side effects. Contact a doctor if:  Your blood pressure keeps being high. Get help right away if:  Your first blood pressure number is higher than 180.  Your second blood pressure number is higher than 120. This information is not intended to replace advice given to you by your health care provider. Make sure you discuss any questions you have with your health care provider. Document Revised: 08/04/2017 Document Reviewed: 01/29/2016 Elsevier Patient Education  [place].  Low-Sodium Eating Plan Sodium, which is an element that makes up salt, helps you maintain a healthy balance of fluids in your body. Too much sodium can increase your blood pressure and cause fluid and waste to be held in your body. Your health care provider or dietitian may recommend following this plan if you have high blood pressure (hypertension), kidney disease, liver disease, or heart failure. Eating less sodium can help lower your blood pressure, reduce swelling, and protect your heart, liver, and kidneys. What are tips for following this plan? General guidelines  Most people on this plan should limit their sodium intake to 1,500-2,000 mg (milligrams) of sodium each day. Reading food labels   The Nutrition Facts label lists the amount of sodium in one serving of the food. If you eat more than one serving, you must multiply the listed amount of sodium by the number of servings.  Choose foods with less than 140 mg of sodium per serving.  Avoid foods with 300 mg of sodium or more per serving. Shopping  Look for lower-sodium products, often labeled as "low-sodium" or "no salt added."  Always check the sodium content even if foods are labeled as "unsalted" or "no  salt added".  Buy fresh foods. ? Avoid canned foods and premade or frozen meals. ? Avoid canned, cured, or processed meats  Buy breads that have less than 80 mg of sodium per slice. Cooking  Eat more home-cooked food and less restaurant, buffet, and fast food.  Avoid adding salt when cooking. Use salt-free seasonings or herbs instead of table salt or sea salt. Check with your health care provider or pharmacist before using salt substitutes.  Cook with plant-based oils, such as canola, sunflower, or olive oil. Meal planning  When eating at a restaurant, ask that your food be prepared with less salt or no salt, if possible.  Avoid foods that contain MSG (monosodium glutamate). MSG is sometimes added to [place] food, bouillon, and some canned foods. What foods are recommended? The items listed may not be a complete list. Talk with your dietitian about what dietary choices are best for you. Grains Low-sodium cereals, including oats, puffed wheat and rice, and  shredded wheat. Low-sodium crackers. Unsalted rice. Unsalted pasta. Low-sodium bread. Whole-grain breads and whole-grain pasta. Vegetables Fresh or frozen vegetables. "No salt added" canned vegetables. "No salt added" tomato sauce and paste. Low-sodium or reduced-sodium tomato and vegetable juice. Fruits Fresh, frozen, or canned fruit. Fruit juice. Meats and other protein foods Fresh or frozen (no salt added) meat, poultry, seafood, and fish. Low-sodium canned tuna and salmon. Unsalted nuts. Dried peas, beans, and lentils without added salt. Unsalted canned beans. Eggs. Unsalted nut butters. Dairy Milk. Soy milk. Cheese that is naturally low in sodium, such as ricotta cheese, fresh mozzarella, or Swiss cheese Low-sodium or reduced-sodium cheese. Cream cheese. Yogurt. Fats and oils Unsalted butter. Unsalted margarine with no trans fat. Vegetable oils such as canola or olive oils. Seasonings and other foods Fresh and dried herbs and  spices. Salt-free seasonings. Low-sodium mustard and ketchup. Sodium-free salad dressing. Sodium-free light mayonnaise. Fresh or refrigerated horseradish. Lemon juice. Vinegar. Homemade, reduced-sodium, or low-sodium soups. Unsalted popcorn and pretzels. Low-salt or salt-free chips. What foods are not recommended? The items listed may not be a complete list. Talk with your dietitian about what dietary choices are best for you. Grains Instant hot cereals. Bread stuffing, pancake, and biscuit mixes. Croutons. Seasoned rice or pasta mixes. Noodle soup cups. Boxed or frozen macaroni and cheese. Regular salted crackers. Self-rising flour. Vegetables Sauerkraut, pickled vegetables, and relishes. Olives. [place] fries. Onion rings. Regular canned vegetables (not low-sodium or reduced-sodium). Regular canned tomato sauce and paste (not low-sodium or reduced-sodium). Regular tomato and vegetable juice (not low-sodium or reduced-sodium). Frozen vegetables in sauces. Meats and other protein foods Meat or fish that is salted, canned, smoked, spiced, or pickled. Bacon, ham, sausage, hotdogs, corned beef, chipped beef, packaged lunch meats, salt pork, jerky, pickled herring, anchovies, regular canned tuna, sardines, salted nuts. Dairy Processed cheese and cheese spreads. Cheese curds. Blue cheese. Feta cheese. String cheese. Regular cottage cheese. Buttermilk. Canned milk. Fats and oils Salted butter. Regular margarine. Ghee. Bacon fat. Seasonings and other foods Onion salt, garlic salt, seasoned salt, table salt, and sea salt. Canned and packaged gravies. Worcestershire sauce. Tartar sauce. Barbecue sauce. Teriyaki sauce. Soy sauce, including reduced-sodium. Steak sauce. Fish sauce. Oyster sauce. Cocktail sauce. Horseradish that you find on the shelf. Regular ketchup and mustard. Meat flavorings and tenderizers. Bouillon cubes. Hot sauce and Tabasco sauce. Premade or packaged marinades. Premade or packaged taco  seasonings. Relishes. Regular salad dressings. Salsa. Potato and tortilla chips. Corn chips and puffs. Salted popcorn and pretzels. Canned or dried soups. Pizza. Frozen entrees and pot pies. Summary  Eating less sodium can help lower your blood pressure, reduce swelling, and protect your heart, liver, and kidneys.  Most people on this plan should limit their sodium intake to 1,500-2,000 mg (milligrams) of sodium each day.  Canned, boxed, and frozen foods are high in sodium. Restaurant foods, fast foods, and pizza are also very high in sodium. You also get sodium by adding salt to food.  Try to cook at home, eat more fresh fruits and vegetables, and eat less fast food, canned, processed, or prepared foods. This information is not intended to replace advice given to you by your health care provider. Make sure you discuss any questions you have with your health care provider. Document Revised: 08/04/2017 Document Reviewed: 08/15/2016 Elsevier Patient Education  2020 [employer].

## 2019-09-20 NOTE — Progress Notes (Signed)
Office Visit    Patient Name: Scott Doyle Date of Encounter: 09/20/2019  Primary Care Provider:  Cletis Athens, MD Primary Cardiologist:  Kathlyn Sacramento, MD  Chief Complaint    59 year old male with history of CAD s/p CABG x1, hypertension, hyperlipidemia, and DM 2, and who presents for 1 year follow-up of CAD and HTN.  Past Medical History    Past Medical History:  Diagnosis Date  . Anxiety   . Coronary artery disease    a. s/p prior CABG x 1 (LIMA->LAD) and PCI to the D2; b. 01/2012 PCI: patent prox Diag stent w/ sev distal dzs->DES x 1; c. 08/2014 Cath: LM nl, LAD 13m, D2 patent stents w/ 72m, LCX nl, RCA 40p, LIMA->LAD nl, EF 60%.  . Diabetes mellitus   . Hyperlipidemia   . Hypertension   . Thrombocytopathia [facility])    Past Surgical History:  Procedure Laterality Date  . CARDIAC CATHETERIZATION    . CARDIAC CATHETERIZATION  08/2014   Patent LIMA to LAD and patent diagonal stents.  . CORONARY ARTERY BYPASS GRAFT  2001   [facility]    Allergies  Allergies  Allergen Reactions  . Ampicillin Hives  . Penicillins Hives and Swelling    History of Present Illness    59 year old male with above past medical history and including CAD's s/p remote CABG x1 with placement of the LIMA to the LAD.  He subsequently underwent PCI to the second diagonal with repeat PCI in May 2013 secondary to more distal disease requiring drug-eluting stent placement.  His last cardiac catheterization was in December 2015, revealing patent diagonal stents.  He has since undergone treadmill testing in 2018, which was reportedly normal.  Other history includes hypertension, hyperlipidemia, anxiety, and DM2.  He was seen 05/2018, at which time he reported he was doing well from a cardiac standpoint.  He was fairly active in the yard without any exertional chest pain or pressure but sometimes did note chest wall soreness.  He thought this was different from prior angina and more likely musculoskeletal in  etiology, as there was some tenderness as well.  He reported that it did not typically limit his activities.   In clinic today, he reports that he overall has been doing well. He does note LUQ pain, which is described as different from his previously documented chest wall soreness. He is unable to identify any clear exacerbating or alleviating factors with this LUQ pain and uncertain of its chronicity. LUQ pain can occur both with exertion and at rest. No associated nausea, emesis, diarrhea, or constipation. He does feel chest wall soreness; however, he attributes this to recently lifting his grandson and the baby carrier / car seat. No recent racing HR or palpitations. He does note occasional dizziness with plans to follow-up with his PCP for carotid studies. No amaurosis fugax or changes in vision reported. No syncope or falls. He notes occasional L ear tinnitus but without any clear triggers or association with elevated BP and with BP 140/70 today. No recent LEE, abdominal distention, orthopnea, PND, or early satiety. He is agreeable to start making changes to his diet including cutting back on salt, alcohol, and sugar and intends to buy a salt substitute on his way home today. He is excited to be taking care of his newest grandchild. He reports he has been very active lately with taking care of his grandson and without any CP / DOE with exertion. No s/sx consistent with bleeding. He reports medication  compliance. He is staying safe during COVID-19.  Home Medications    Prior to Admission medications   Medication Sig Start Date End Date Taking? Authorizing Provider  aspirin 81 MG tablet Take 81 mg by mouth daily.   Yes [provider]  atenolol (TENORMIN) 25 MG tablet Take 1 tablet (25 mg total) by mouth daily. 05/11/18  Yes Theora Gianotti, NP  cholecalciferol (VITAMIN D) 1000 units tablet Take 1,000 Units by mouth daily. 03/20/18  Yes [provider]  glipiZIDE (GLUCOTROL XL)  2.5 MG 24 hr tablet Take 2.5 mg by mouth daily. 08/18/14  Yes [provider]  isosorbide mononitrate (IMDUR) 30 MG 24 hr tablet Take 3 tablets (90 mg total) by mouth daily. 05/11/18  Yes Theora Gianotti, NP  LANTUS SOLOSTAR 100 UNIT/ML Solostar Pen INJECT 10 UNITS DAILY 03/31/18  Yes [provider]  metFORMIN (GLUCOPHAGE) 1000 MG tablet Take 1 tablet by mouth Twice daily. 11/07/11  Yes [provider]  nitroGLYCERIN (NITROSTAT) 0.4 MG SL tablet Place 1 tablet (0.4 mg total) under the tongue every 5 (five) minutes as needed. Maximum of 3 doses. 05/11/18  Yes Theora Gianotti, NP  OMEPRAZOLE PO Take by mouth daily.   Yes [provider]  quinapril (ACCUPRIL) 10 MG tablet Take 1 tablet (10 mg total) by mouth daily. 05/11/18  Yes Theora Gianotti, NP  rosuvastatin (CRESTOR) 40 MG tablet Take 1 tablet (40 mg total) by mouth daily. 05/11/18  Yes Theora Gianotti, NP    Review of Systems    He denies palpitations, dyspnea, pnd, orthopnea, n, v, syncope, edema, weight gain, or early satiety. He reports unchanged / stable atypical CP, thought most consistent with MSK etiology and ongoing from previous visits. He also reports new LUQ pain without clear triggers or alleviating factors and of unknown chronicity. L ear tinnitus reported and occasional but unclear if related to higher BP. He does report occasional dizziness but denies amaurosis fugax. All other systems reviewed and are otherwise negative except as noted above.  Physical Exam    VS:  Ht 5' 6.5" (1.689 m)   Wt 177 lb 8 oz (80.5 kg)   BMI 28.22 kg/m  , BMI Body mass index is 28.22 kg/m. GEN: Well nourished, well developed, in no acute distress. Mask in place. HEENT: normal. Neck: Supple, no JVD, carotid bruits, or masses. Cardiac: RRR, no murmurs, rubs, or gallops. No clubbing, cyanosis, edema.  Radials/DP/PT 2+ and equal bilaterally.  Respiratory:  Respirations regular and  unlabored, clear to auscultation bilaterally. GI: Soft, nontender, nondistended, BS + x 4. MS: no deformity or atrophy. Skin: warm and dry, no rash. Neuro:  Strength and sensation are intact. Psych: Normal affect.  Accessory Clinical Findings    ECG personally reviewed by me today - 61bpm, NSR, nonspecific ST repolarization changes in inferior leads and not new when compared wiht previous EKGs- no acute changes.  Filed Weights   09/20/19 1601  Weight: 177 lb 8 oz (80.5 kg)    PCP labs (transcribed) 08/09/2019: total cholesterol  165 TG 150, HDL 26, LDL 113; TSH 1.05, Hgb 15.8, HCT 45.1, plts 170, Na 137, K 4.8, Cr 0.88, BUN 10, glucose 219, A1C 8.3   Assessment & Plan    CAD s/p CABG x1 and diagonal stenting x2 --No current CP. Continues to report atypical MSK pain and new LUQ pain without clear triggers and more consistent with possible GI etiology. He has not needed his SL nitro. No  SOB/DOE. BP elevated today with PCP paperwork received indicating patient now takes Quinapril 40mg  daily, which will be corrected in our EMR. Patient preference to first attempt diet and lifestyle changes with recommendations discussed in office. Escalation of atenolol precluded by lower HR today with h/o bradycardic rates.  Please refer to the above transcribed labs for most recent BMET and CBC. Continue current ASA, ACE, BB, nitrates, and statin.   Essential Hypertension --As above, we will first plan for lifestyle changes, including those discussed regarding diet and exercise. If BP remains elevated at follow-up, and despite these lifestyle changes, we will need to consider increasing Quinapril (now 40mg  daily per PCP paperwork) to 80mg  daily. Continue BB, as escalation of atenolol precluded by history of bradycardic rates with HR 61bpm today. Most recent TSH as above and nl.   HLD --Continue Crestor. Labs deferred to PCP per patient preference. Most recent PCP labs sent to our office and will scan for our  records. Given most recent LDL 113, consider addition of Zetia to current Crestor for more optimal lipid control. Current ALT 43 and AST 36 with recommendation to keep a close eye on LFTs at next PCP follow-up.  DM2 --Per PCP. A1C slightly improved from previous 9.1  8.3 on review of PCP records delivered to office today.   Diet lifestyle changes discussed in detail to decrease BP between visits and before medication changes. Reviewed PCP paperwork and labs with patient, as well as previous studies available on EMR, and possible future workup. Reviewed medications to ensure consistent list with that of PCP. At least 45 minutes spent discussing patient plan for care.   Disposition: Plan for follow-up in office in 6 months with preference for Dr. Fletcher Anon, and if schedule full, follow-up with Marrianne Mood, PA-C. Discussed future carotid ultrasound with preference to defer further studies to PCP. Will attempt diet and lifestyle changes to lower BP.  Consider increasing his Quinapril at next visit if BP remains elevated at follow-up. Will plan for PCP labs/paperwork received today to be scanned into EMR. Patient preference for PCP to obtain labs.  Arvil Chaco, PA-C 09/20/2019, 4:05 PM

## 2020-01-13 ENCOUNTER — Other Ambulatory Visit: Payer: Self-pay | Admitting: *Deleted

## 2020-01-13 MED ORDER — ATENOLOL 25 MG PO TABS: 25.0000 mg | ORAL_TABLET | Freq: Every day | ORAL | 3 refills | Status: DC

## 2020-01-15 ENCOUNTER — Other Ambulatory Visit: Payer: Self-pay

## 2020-01-15 MED ORDER — ATENOLOL 25 MG PO TABS: 25.0000 mg | ORAL_TABLET | Freq: Every day | ORAL | 3 refills | Status: DC

## 2020-01-20 ENCOUNTER — Other Ambulatory Visit: Payer: Self-pay | Admitting: *Deleted

## 2020-01-20 MED ORDER — LANTUS SOLOSTAR 100 UNIT/ML ~~LOC~~ SOPN: PEN_INJECTOR | SUBCUTANEOUS | 3 refills | Status: DC

## 2020-02-24 ENCOUNTER — Other Ambulatory Visit: Payer: Self-pay | Admitting: *Deleted

## 2020-02-24 MED ORDER — LANTUS SOLOSTAR 100 UNIT/ML ~~LOC~~ SOPN: PEN_INJECTOR | SUBCUTANEOUS | 3 refills | Status: DC

## 2020-04-16 ENCOUNTER — Other Ambulatory Visit: Payer: Self-pay

## 2020-04-16 MED ORDER — GLIPIZIDE ER 2.5 MG PO TB24: 2.5000 mg | ORAL_TABLET | Freq: Every day | ORAL | 3 refills | Status: DC

## 2020-04-16 MED ORDER — OMEPRAZOLE 20 MG PO CPDR: 20.0000 mg | DELAYED_RELEASE_CAPSULE | Freq: Every day | ORAL | 3 refills | Status: DC

## 2020-05-15 ENCOUNTER — Ambulatory Visit (INDEPENDENT_AMBULATORY_CARE_PROVIDER_SITE_OTHER): Payer: Medicare HMO | Admitting: Internal Medicine

## 2020-05-15 ENCOUNTER — Encounter: Payer: Self-pay | Admitting: Internal Medicine

## 2020-05-15 ENCOUNTER — Other Ambulatory Visit: Payer: Self-pay

## 2020-05-15 VITALS — BP 134/66 | HR 62 | Temp 96.4°F | Resp 96 | Ht 67.0 in | Wt 171.9 lb

## 2020-05-15 DIAGNOSIS — E782 Mixed hyperlipidemia: Secondary | ICD-10-CM | POA: Diagnosis not present

## 2020-05-15 DIAGNOSIS — I10 Essential (primary) hypertension: Secondary | ICD-10-CM

## 2020-05-15 DIAGNOSIS — J329 Chronic sinusitis, unspecified: Secondary | ICD-10-CM | POA: Diagnosis not present

## 2020-05-15 DIAGNOSIS — F3113 Bipolar disorder, current episode manic without psychotic features, severe: Secondary | ICD-10-CM

## 2020-05-15 LAB — POC COVID19 BINAXNOW: SARS Coronavirus 2 Ag: NEGATIVE

## 2020-05-15 LAB — CHG IAAD IA SEVERE AQT RESPIR SYND CORONAVIRUS

## 2020-05-15 NOTE — Progress Notes (Signed)
Established Patient Office Visit  SUBJECTIVE:  Subjective  Patient ID: Scott Doyle, male    DOB: 1961-08-20  Age: 59 y.o. MRN: [id number]  CC:  Chief Complaint  Patient presents with  . Sinusitis    HPI Scott Doyle is a 59 y.o. male presenting today for an evaluation of his sinusitis.  Sinusitis This is a new problem. The current episode started in the past 7 days. The problem has been gradually worsening since onset. There has been no fever. Associated symptoms include congestion, coughing (due to rhinorrhea), a hoarse voice, sinus pressure, sneezing, a sore throat and swollen glands. Pertinent negatives include no chills or headaches. Treatments tried: Coricidin.    Past Medical History:  Diagnosis Date  . Anxiety   . Coronary artery disease    a. s/p prior CABG x 1 (LIMA->LAD) and PCI to the D2; b. 01/2012 PCI: patent prox Diag stent w/ sev distal dzs->DES x 1; c. 08/2014 Cath: LM nl, LAD 13m, D2 patent stents w/ 35m, LCX nl, RCA 40p, LIMA->LAD nl, EF 60%.  . Diabetes mellitus   . Hyperlipidemia   . Hypertension   . Thrombocytopathia [facility])     Past Surgical History:  Procedure Laterality Date  . CARDIAC CATHETERIZATION    . CARDIAC CATHETERIZATION  08/2014   Patent LIMA to LAD and patent diagonal stents.  . CORONARY ARTERY BYPASS GRAFT  2001   [facility]    Family History  Problem Relation Age of Onset  . Hypertension Mother   . Heart disease Mother   . Diabetes Unknown        fx  . Cancer Unknown        mat. family    Social History   Socioeconomic History  . Marital status: Married    Spouse name: Not on file  . Number of children: Not on file  . Years of education: Not on file  . Highest education level: Not on file  Occupational History  . Not on file  Tobacco Use  . Smoking status: Former Research scientist (life sciences)  . Smokeless tobacco: Never Used  Substance and Sexual Activity  . Alcohol use: Yes  . Drug use: No  . Sexual activity: Not on file  Other Topics  Concern  . Not on file  Social History Narrative  . Not on file   Social Determinants of Health   Financial Resource Strain:   . Difficulty of Paying Living Expenses: Not on file  Food Insecurity:   . Worried About Charity fundraiser in the Last Year: Not on file  . Ran Out of Food in the Last Year: Not on file  Transportation Needs:   . Lack of Transportation (Medical): Not on file  . Lack of Transportation (Non-Medical): Not on file  Physical Activity:   . Days of Exercise per Week: Not on file  . Minutes of Exercise per Session: Not on file  Stress:   . Feeling of Stress : Not on file  Social Connections:   . Frequency of Communication with Friends and Family: Not on file  . Frequency of Social Gatherings with Friends and Family: Not on file  . Attends Religious Services: Not on file  . Active Member of Clubs or Organizations: Not on file  . Attends Archivist Meetings: Not on file  . Marital Status: Not on file  Intimate Partner Violence:   . Fear of Current or Ex-Partner: Not on file  . Emotionally Abused: Not on  file  . Physically Abused: Not on file  . Sexually Abused: Not on file     Current Outpatient Medications:  .  aspirin 81 MG tablet, Take 81 mg by mouth daily., Disp: , Rfl:  .  atenolol (TENORMIN) 25 MG tablet, Take 1 tablet (25 mg total) by mouth daily., Disp: 90 tablet, Rfl: 3 .  cholecalciferol (VITAMIN D) 1000 units tablet, Take 1,000 Units by mouth daily., Disp: , Rfl: 4 .  glipiZIDE (GLUCOTROL XL) 2.5 MG 24 hr tablet, Take 1 tablet (2.5 mg total) by mouth daily., Disp: 90 tablet, Rfl: 3 .  isosorbide mononitrate (IMDUR) 30 MG 24 hr tablet, Take 3 tablets (90 mg total) by mouth daily., Disp: 270 tablet, Rfl: 3 .  LANTUS SOLOSTAR 100 UNIT/ML Solostar Pen, 40 Units., Disp: 15 mL, Rfl: 3 .  metFORMIN (GLUCOPHAGE) 1000 MG tablet, Take 1 tablet by mouth Twice daily., Disp: , Rfl:  .  nitroGLYCERIN (NITROSTAT) 0.4 MG SL tablet, Place 1 tablet (0.4  mg total) under the tongue every 5 (five) minutes as needed. Maximum of 3 doses., Disp: 25 tablet, Rfl: 6 .  omeprazole (PRILOSEC) 20 MG capsule, Take 1 capsule (20 mg total) by mouth daily., Disp: 90 capsule, Rfl: 3 .  quinapril (ACCUPRIL) 10 MG tablet, Take 4 tablets (40 mg total) by mouth daily., Disp: 90 tablet, Rfl: 3 .  rosuvastatin (CRESTOR) 40 MG tablet, Take 1 tablet (40 mg total) by mouth daily., Disp: 90 tablet, Rfl: 3   Allergies  Allergen Reactions  . Ampicillin Hives  . Penicillins Hives and Swelling    ROS Review of Systems  Constitutional: Negative.  Negative for chills.  HENT: Positive for congestion, hoarse voice, postnasal drip, rhinorrhea, sinus pressure, sneezing and sore throat.   Eyes: Negative.   Respiratory: Positive for cough (due to rhinorrhea).   Cardiovascular: Negative.   Gastrointestinal: Negative.   Endocrine: Negative.   Genitourinary: Negative.   Musculoskeletal: Negative.   Skin: Negative.   Allergic/Immunologic: Negative.   Neurological: Negative.  Negative for headaches.  Hematological: Negative.   Psychiatric/Behavioral: Negative.   All other systems reviewed and are negative.    OBJECTIVE:    Physical Exam Vitals reviewed.  Constitutional:      Appearance: Normal appearance.  HENT:     Mouth/Throat:     Mouth: Mucous membranes are moist.     Pharynx: Posterior oropharyngeal erythema present.  Eyes:     Pupils: Pupils are equal, round, and reactive to light.  Neck:     Vascular: No carotid bruit.  Cardiovascular:     Rate and Rhythm: Normal rate and regular rhythm.     Pulses: Normal pulses.     Heart sounds: Normal heart sounds.  Pulmonary:     Effort: Pulmonary effort is normal.     Breath sounds: Normal breath sounds.  Abdominal:     General: Bowel sounds are normal.     Palpations: Abdomen is soft. There is no hepatomegaly, splenomegaly or mass.     Tenderness: There is no abdominal tenderness.     Hernia: No hernia is  present.  Musculoskeletal:     Cervical back: Neck supple.     Right lower leg: No edema.     Left lower leg: No edema.  Skin:    Findings: No rash.  Neurological:     Mental Status: He is alert and oriented to person, place, and time.     Motor: No weakness.  Psychiatric:  Mood and Affect: Mood normal.        Behavior: Behavior normal.     BP 134/66   Pulse 62   Temp (!) 96.4 F (35.8 C)   Resp (!) 96   Ht 5\' 7"  (1.702 m)   Wt 171 lb 14.4 oz (78 kg)   BMI 26.92 kg/m  Wt Readings from Last 3 Encounters:  05/15/20 171 lb 14.4 oz (78 kg)  09/20/19 177 lb 8 oz (80.5 kg)  05/11/18 170 lb 12 oz (77.5 kg)    Health Maintenance Due  Topic Date Due  . Hepatitis C Screening  Never done  . PNEUMOCOCCAL POLYSACCHARIDE VACCINE AGE 32-64 HIGH RISK  Never done  . FOOT EXAM  Never done  . OPHTHALMOLOGY EXAM  Never done  . COVID-19 Vaccine (1) Never done  . HIV Screening  Never done  . TETANUS/TDAP  Never done  . COLONOSCOPY  Never done  . HEMOGLOBIN A1C  03/14/2017  . INFLUENZA VACCINE  04/05/2020    There are no preventive care reminders to display for this patient.  CBC Latest Ref Rng & Units 01/05/2018 12/31/2017 09/14/2016  WBC 4.0 - 10.5 K/uL 4.9 5.9 5.6  Hemoglobin 13.0 - 17.0 g/dL 15.2 15.3 15.0  Hematocrit 39 - 52 % 44.3 44.1 44.3  Platelets 150 - 400 K/uL 135(L) 114(L) 141(L)   CMP Latest Ref Rng & Units 01/05/2018 12/31/2017 09/14/2016  Glucose 65 - 99 mg/dL - 268(H) 240(H)  BUN 6 - 20 mg/dL - 11 9  Creatinine 0.61 - 1.24 mg/dL - 0.92 0.79  Sodium 135 - 145 mmol/L - 139 137  Potassium 3.5 - 5.1 mmol/L - 3.8 4.4  Chloride 101 - 111 mmol/L - 105 104  CO2 22 - 32 mmol/L - 25 24  Calcium 8.9 - 10.3 mg/dL - 8.7(L) 8.7(L)  Total Protein 6.5 - 8.1 g/dL 7.1 7.1 -  Total Bilirubin 0.3 - 1.2 mg/dL 0.7 0.6 -  Alkaline Phos 38 - 126 U/L 74 110 -  AST 15 - 41 U/L 31 32 -  ALT 17 - 63 U/L 31 35 -    Lab Results  Component Value Date   TSH 0.958 09/14/2016   Lab  Results  Component Value Date   ALBUMIN 3.8 01/05/2018   ANIONGAP 9 12/31/2017   Lab Results  Component Value Date   CHOL 139 09/14/2016   CHOL 95 11/25/2011   HDL 30 (L) 09/14/2016   HDL 29 (L) 11/25/2011   LDLCALC 87 09/14/2016   LDLCALC 51 11/25/2011   CHOLHDL 4.6 09/14/2016   Lab Results  Component Value Date   TRIG 108 09/14/2016   Lab Results  Component Value Date   HGBA1C 9.1 (H) 09/14/2016   HGBA1C 6.8 (H) 11/25/2011      ASSESSMENT & PLAN:   Problem List Items Addressed This Visit      Cardiovascular and Mediastinum   Hypertension    - Today, the patient's blood pressure is well managed on acupril. - The patient will continue the current treatment regimen.  - I encouraged the patient to eat a low-sodium diet to help control blood pressure. - I encouraged the patient to live an active lifestyle and complete activities that increases heart rate to 85% target heart rate at least 5 times per week for one hour.            Respiratory   Sinusitis - Primary    covid  Neg started  On z pack  Relevant Orders   Infect agent antigen immuno technique acute respiratory syndrome covid (Completed)   POC COVID-19 (Completed)     Other   Hyperlipidemia    - The patient's hyperlipidemia is stable on crestor. - The patient will continue the current treatment regimen.  - I encouraged the patient to eat more vegetables and whole wheat, and to avoid fatty foods like whole milk, hard cheese, egg yolks, margarine, baked sweets, and fried foods.  - I encouraged the patient to live an active lifestyle and complete activities for 40 minutes at least three times per week.  - I instructed the patient to go to the ER if they begin having chest pain.       Bipolar affective disorder ([place])    stable         Follow-   COVID rapid  Test  Is neg Cletis Athens, MD [facility] [address], [place], [place] [address]   By signing my name below, I, [employer], attest that this documentation has been prepared under the direction and in the presence of Dr. Cletis Athens Electronically Signed: Cletis Athens, MD 05/17/20, 4:21 PM  I personally performed the services described in this documentation, which was SCRIBED in my presence. The recorded information has been reviewed and considered accurate. It has been edited as necessary during review. Cletis Athens, MD

## 2020-05-17 ENCOUNTER — Encounter: Payer: Self-pay | Admitting: Internal Medicine

## 2020-05-17 DIAGNOSIS — J329 Chronic sinusitis, unspecified: Secondary | ICD-10-CM | POA: Insufficient documentation

## 2020-05-17 NOTE — Assessment & Plan Note (Signed)
stable °

## 2020-05-17 NOTE — Assessment & Plan Note (Signed)
-   Today, the patient's blood pressure is well managed on acupril. - The patient will continue the current treatment regimen.  - I encouraged the patient to eat a low-sodium diet to help control blood pressure. - I encouraged the patient to live an active lifestyle and complete activities that increases heart rate to 85% target heart rate at least 5 times per week for one hour.

## 2020-05-17 NOTE — Assessment & Plan Note (Signed)
-   The patient's hyperlipidemia is stable on crestor. - The patient will continue the current treatment regimen.  - I encouraged the patient to eat more vegetables and whole wheat, and to avoid fatty foods like whole milk, hard cheese, egg yolks, margarine, baked sweets, and fried foods.  - I encouraged the patient to live an active lifestyle and complete activities for 40 minutes at least three times per week.  - I instructed the patient to go to the ER if they begin having chest pain.   

## 2020-05-17 NOTE — Assessment & Plan Note (Signed)
covid  Neg started  On z pack

## 2020-06-16 ENCOUNTER — Encounter: Payer: Self-pay | Admitting: Internal Medicine

## 2020-06-16 ENCOUNTER — Other Ambulatory Visit: Payer: Self-pay

## 2020-06-16 ENCOUNTER — Ambulatory Visit (INDEPENDENT_AMBULATORY_CARE_PROVIDER_SITE_OTHER): Payer: Medicare HMO | Admitting: Internal Medicine

## 2020-06-16 VITALS — BP 152/72 | HR 66 | Ht 67.0 in | Wt 170.1 lb

## 2020-06-16 DIAGNOSIS — E782 Mixed hyperlipidemia: Secondary | ICD-10-CM | POA: Diagnosis not present

## 2020-06-16 DIAGNOSIS — I10 Essential (primary) hypertension: Secondary | ICD-10-CM

## 2020-06-16 DIAGNOSIS — F419 Anxiety disorder, unspecified: Secondary | ICD-10-CM | POA: Diagnosis not present

## 2020-06-16 DIAGNOSIS — Z23 Encounter for immunization: Secondary | ICD-10-CM | POA: Insufficient documentation

## 2020-06-16 DIAGNOSIS — E139 Other specified diabetes mellitus without complications: Secondary | ICD-10-CM

## 2020-06-16 DIAGNOSIS — C44509 Unspecified malignant neoplasm of skin of other part of trunk: Secondary | ICD-10-CM | POA: Diagnosis not present

## 2020-06-16 DIAGNOSIS — E1169 Type 2 diabetes mellitus with other specified complication: Secondary | ICD-10-CM | POA: Insufficient documentation

## 2020-06-16 LAB — GLUCOSE, POCT (MANUAL RESULT ENTRY): POC Glucose: 190 mg/dl — AB (ref 70–99)

## 2020-06-16 MED ORDER — TRAZODONE HCL 50 MG PO TABS: 25.0000 mg | ORAL_TABLET | Freq: Every evening | ORAL | 3 refills | Status: DC | PRN

## 2020-06-16 NOTE — Assessment & Plan Note (Signed)
It appears that patient had a skin cancer in the left upper chest I will send him to a dermatologist for evaluation and excision and removal

## 2020-06-16 NOTE — Assessment & Plan Note (Signed)
Patient received a shot for influenza today

## 2020-06-16 NOTE — Assessment & Plan Note (Addendum)
Patient has problem with anxiety and has a history of depression in the past.  Now he cannot sleep at night I am going to give him a prescription for trazodone.

## 2020-06-16 NOTE — Progress Notes (Signed)
Established Patient Office Visit  SUBJECTIVE:  Subjective  Patient ID: Scott Doyle, male    DOB: 1960-11-29  Age: 59 y.o. MRN: [id number]  CC:  Chief Complaint  Patient presents with  . Skin wart    Patient complains of a warty on his left shoulder, patient has been using antibiotic cream, and is still having discomfort    HPI Scott Doyle is a 59 y.o. male presenting today for an evaluation of a skin wart.  Wart is bleeding and he has recorded up.  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  Has seen the skin specialist in the past.  His diabetes is under control and he takes his medicine regularly.  Patient denies any chest pain or shortness of breath, he complains of insomnia and occasionally anxiety.  Denies any history of swelling of the legs or abdominal swelling.  His coronary artery disease is stable he denies any chest pain or arrhythmia or shortness of breath denies any history of claudication.  He is taking Crestor for his hyperlipidemia.  Blood pressure is under control.   Past Medical History:  Diagnosis Date  . Anxiety   . Coronary artery disease    a. s/p prior CABG x 1 (LIMA->LAD) and PCI to the D2; b. 01/2012 PCI: patent prox Diag stent w/ sev distal dzs->DES x 1; c. 08/2014 Cath: LM nl, LAD 105m, D2 patent stents w/ 45m, LCX nl, RCA 40p, LIMA->LAD nl, EF 60%.  . Diabetes mellitus   . Hyperlipidemia   . Hypertension   . Thrombocytopathia [facility])     Past Surgical History:  Procedure Laterality Date  . CARDIAC CATHETERIZATION    . CARDIAC CATHETERIZATION  08/2014   Patent LIMA to LAD and patent diagonal stents.  . CORONARY ARTERY BYPASS GRAFT  2001   [facility]    Family History  Problem Relation Age of Onset  . Hypertension Mother   . Heart disease Mother   . Diabetes Unknown        fx  . Cancer Unknown        mat. family    Social History   Socioeconomic History  . Marital status: Married    Spouse name: Not on file  . Number of  children: Not on file  . Years of education: Not on file  . Highest education level: Not on file  Occupational History  . Not on file  Tobacco Use  . Smoking status: Former Research scientist (life sciences)  . Smokeless tobacco: Never Used  Substance and Sexual Activity  . Alcohol use: Yes  . Drug use: No  . Sexual activity: Not on file  Other Topics Concern  . Not on file  Social History Narrative  . Not on file   Social Determinants of Health   Financial Resource Strain:   . Difficulty of Paying Living Expenses: Not on file  Food Insecurity:   . Worried About Charity fundraiser in the Last Year: Not on file  . Ran Out of Food in the Last Year: Not on file  Transportation Needs:   . Lack of Transportation (Medical): Not on file  . Lack of Transportation (Non-Medical): Not on file  Physical Activity:   . Days of Exercise per Week: Not on file  . Minutes of Exercise per Session: Not on file  Stress:   . Feeling of Stress : Not on file  Social Connections:   . Frequency of Communication with Friends and Family:  Not on file  . Frequency of Social Gatherings with Friends and Family: Not on file  . Attends Religious Services: Not on file  . Active Member of Clubs or Organizations: Not on file  . Attends Archivist Meetings: Not on file  . Marital Status: Not on file  Intimate Partner Violence:   . Fear of Current or Ex-Partner: Not on file  . Emotionally Abused: Not on file  . Physically Abused: Not on file  . Sexually Abused: Not on file     Current Outpatient Medications:  .  aspirin 81 MG tablet, Take 81 mg by mouth daily., Disp: , Rfl:  .  atenolol (TENORMIN) 25 MG tablet, Take 1 tablet (25 mg total) by mouth daily., Disp: 90 tablet, Rfl: 3 .  cholecalciferol (VITAMIN D) 1000 units tablet, Take 1,000 Units by mouth daily., Disp: , Rfl: 4 .  glipiZIDE (GLUCOTROL XL) 2.5 MG 24 hr tablet, Take 1 tablet (2.5 mg total) by mouth daily., Disp: 90 tablet, Rfl: 3 .  isosorbide mononitrate  (IMDUR) 30 MG 24 hr tablet, Take 3 tablets (90 mg total) by mouth daily., Disp: 270 tablet, Rfl: 3 .  LANTUS SOLOSTAR 100 UNIT/ML Solostar Pen, 40 Units., Disp: 15 mL, Rfl: 3 .  metFORMIN (GLUCOPHAGE) 1000 MG tablet, Take 1 tablet by mouth Twice daily., Disp: , Rfl:  .  nitroGLYCERIN (NITROSTAT) 0.4 MG SL tablet, Place 1 tablet (0.4 mg total) under the tongue every 5 (five) minutes as needed. Maximum of 3 doses., Disp: 25 tablet, Rfl: 6 .  omeprazole (PRILOSEC) 20 MG capsule, Take 1 capsule (20 mg total) by mouth daily., Disp: 90 capsule, Rfl: 3 .  quinapril (ACCUPRIL) 10 MG tablet, Take 4 tablets (40 mg total) by mouth daily., Disp: 90 tablet, Rfl: 3 .  rosuvastatin (CRESTOR) 40 MG tablet, Take 1 tablet (40 mg total) by mouth daily., Disp: 90 tablet, Rfl: 3 .  traZODone (DESYREL) 50 MG tablet, Take 0.5-1 tablets (25-50 mg total) by mouth at bedtime as needed for sleep., Disp: 30 tablet, Rfl: 3   Allergies  Allergen Reactions  . Ampicillin Hives  . Penicillins Hives and Swelling    ROS Review of Systems  Constitutional: Negative.   HENT: Negative.   Eyes: Negative.   Respiratory: Negative.   Cardiovascular: Negative.   Gastrointestinal: Negative.   Endocrine: Negative.   Genitourinary: Negative.   Musculoskeletal: Negative.   Skin: Negative.        + Wart on shoulder  Allergic/Immunologic: Negative.   Neurological: Negative.   Hematological: Negative.   Psychiatric/Behavioral: Negative.   All other systems reviewed and are negative.    OBJECTIVE:    Physical Exam Vitals reviewed.  Constitutional:      Appearance: Normal appearance.  HENT:     Mouth/Throat:     Mouth: Mucous membranes are moist.  Eyes:     Pupils: Pupils are equal, round, and reactive to light.  Neck:     Vascular: No carotid bruit.  Cardiovascular:     Rate and Rhythm: Normal rate and regular rhythm.     Pulses: Normal pulses.     Heart sounds: Normal heart sounds.  Pulmonary:     Effort:  Pulmonary effort is normal.     Breath sounds: Normal breath sounds.  Abdominal:     General: Bowel sounds are normal.     Palpations: Abdomen is soft. There is no hepatomegaly, splenomegaly or mass.     Tenderness: There is no abdominal tenderness.  Hernia: No hernia is present.  Musculoskeletal:     Cervical back: Neck supple.     Right lower leg: No edema.     Left lower leg: No edema.  Skin:    Findings: No rash.  Neurological:     Mental Status: He is alert and oriented to person, place, and time.     Motor: No weakness.  Psychiatric:        Mood and Affect: Mood normal.        Behavior: Behavior normal.     BP (!) 152/72   Pulse 66   Ht 5\' 7"  (1.702 m)   Wt 170 lb 1.6 oz (77.2 kg)   BMI 26.64 kg/m  Wt Readings from Last 3 Encounters:  06/16/20 170 lb 1.6 oz (77.2 kg)  05/15/20 171 lb 14.4 oz (78 kg)  09/20/19 177 lb 8 oz (80.5 kg)    Health Maintenance Due  Topic Date Due  . Hepatitis C Screening  Never done  . PNEUMOCOCCAL POLYSACCHARIDE VACCINE AGE 3-64 HIGH RISK  Never done  . FOOT EXAM  Never done  . OPHTHALMOLOGY EXAM  Never done  . COVID-19 Vaccine (1) Never done  . HIV Screening  Never done  . TETANUS/TDAP  Never done  . COLONOSCOPY  Never done  . HEMOGLOBIN A1C  03/14/2017    There are no preventive care reminders to display for this patient.  CBC Latest Ref Rng & Units 01/05/2018 12/31/2017 09/14/2016  WBC 4.0 - 10.5 K/uL 4.9 5.9 5.6  Hemoglobin 13.0 - 17.0 g/dL 15.2 15.3 15.0  Hematocrit 39 - 52 % 44.3 44.1 44.3  Platelets 150 - 400 K/uL 135(L) 114(L) 141(L)   CMP Latest Ref Rng & Units 01/05/2018 12/31/2017 09/14/2016  Glucose 65 - 99 mg/dL - 268(H) 240(H)  BUN 6 - 20 mg/dL - 11 9  Creatinine 0.61 - 1.24 mg/dL - 0.92 0.79  Sodium 135 - 145 mmol/L - 139 137  Potassium 3.5 - 5.1 mmol/L - 3.8 4.4  Chloride 101 - 111 mmol/L - 105 104  CO2 22 - 32 mmol/L - 25 24  Calcium 8.9 - 10.3 mg/dL - 8.7(L) 8.7(L)  Total Protein 6.5 - 8.1 g/dL 7.1 7.1 -    Total Bilirubin 0.3 - 1.2 mg/dL 0.7 0.6 -  Alkaline Phos 38 - 126 U/L 74 110 -  AST 15 - 41 U/L 31 32 -  ALT 17 - 63 U/L 31 35 -    Lab Results  Component Value Date   TSH 0.958 09/14/2016   Lab Results  Component Value Date   ALBUMIN 3.8 01/05/2018   ANIONGAP 9 12/31/2017   Lab Results  Component Value Date   CHOL 139 09/14/2016   CHOL 95 11/25/2011   HDL 30 (L) 09/14/2016   HDL 29 (L) 11/25/2011   LDLCALC 87 09/14/2016   LDLCALC 51 11/25/2011   CHOLHDL 4.6 09/14/2016   Lab Results  Component Value Date   TRIG 108 09/14/2016   Lab Results  Component Value Date   HGBA1C 9.1 (H) 09/14/2016   HGBA1C 6.8 (H) 11/25/2011      ASSESSMENT & PLAN:   Problem List Items Addressed This Visit      Cardiovascular and Mediastinum   Hypertension   Essential hypertension    - Today, the patient's blood pressure is well managed on ace. - The patient will continue the current treatment regimen.  - I encouraged the patient to eat a low-sodium diet to help control  blood pressure. - I encouraged the patient to live an active lifestyle and complete activities that increases heart rate to 85% target heart rate at least 5 times per week for one hour.            Endocrine   Diabetes 1.5, managed as type 2 ([place])    Diabetes under control.      Relevant Orders   POCT glucose (manual entry) (Completed)     Musculoskeletal and Integument   Skin cancer of anterior chest    It appears that patient had a skin cancer in the left upper chest I will send him to a dermatologist for evaluation and excision and removal        Other   Anxiety    Patient has problem with anxiety and has a history of depression in the past.  Now he cannot sleep at night I am going to give him a prescription for trazodone.      Relevant Medications   traZODone (DESYREL) 50 MG tablet   Mixed hyperlipidemia   Need for influenza vaccination - Primary    Patient received a shot for influenza today       Relevant Orders   Flu Vaccine QUAD 36+ mos IM (Completed)      Meds ordered this encounter  Medications  . traZODone (DESYREL) 50 MG tablet    Sig: Take 0.5-1 tablets (25-50 mg total) by mouth at bedtime as needed for sleep.    Dispense:  30 tablet    Refill:  3    Follow-up: No follow-ups on file.    Cletis Athens, MD [facility] [address] [place], [place] [address]   By signing my name below, I, [employer], attest that this documentation has been prepared under the direction and in the presence of Dr. Cletis Athens Electronically Signed: Cletis Athens, MD 06/16/20, 12:50 PM I personally performed the services described in this documentation, which was SCRIBED in my presence. The recorded information has been reviewed and considered accurate. It has been edited as necessary during review. Cletis Athens, MD

## 2020-06-16 NOTE — Assessment & Plan Note (Signed)
Diabetes under control °

## 2020-06-16 NOTE — Assessment & Plan Note (Signed)
-   Today, the patient's blood pressure is well managed on ace. - The patient will continue the current treatment regimen.  - I encouraged the patient to eat a low-sodium diet to help control blood pressure. - I encouraged the patient to live an active lifestyle and complete activities that increases heart rate to 85% target heart rate at least 5 times per week for one hour.     

## 2020-06-17 NOTE — Addendum Note (Signed)
Addended by: Lacretia Nicks L on: 06/17/2020 11:18 AM   Modules accepted: Orders

## 2020-06-21 ENCOUNTER — Other Ambulatory Visit: Payer: Self-pay | Admitting: Internal Medicine

## 2020-06-21 DIAGNOSIS — I119 Hypertensive heart disease without heart failure: Secondary | ICD-10-CM

## 2020-06-22 ENCOUNTER — Other Ambulatory Visit: Payer: Self-pay

## 2020-06-22 ENCOUNTER — Ambulatory Visit: Payer: Medicare HMO | Admitting: Dermatology

## 2020-06-22 ENCOUNTER — Encounter: Payer: Self-pay | Admitting: Dermatology

## 2020-06-22 DIAGNOSIS — D485 Neoplasm of uncertain behavior of skin: Secondary | ICD-10-CM

## 2020-06-22 DIAGNOSIS — L578 Other skin changes due to chronic exposure to nonionizing radiation: Secondary | ICD-10-CM

## 2020-06-22 DIAGNOSIS — L821 Other seborrheic keratosis: Secondary | ICD-10-CM

## 2020-06-22 DIAGNOSIS — L820 Inflamed seborrheic keratosis: Secondary | ICD-10-CM | POA: Diagnosis not present

## 2020-06-22 DIAGNOSIS — D492 Neoplasm of unspecified behavior of bone, soft tissue, and skin: Secondary | ICD-10-CM
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
## 2020-06-22 NOTE — Patient Instructions (Addendum)
Cryotherapy Aftercare  . Wash gently with soap and water everyday.   . Apply Vaseline and Band-Aid daily until healed.  

## 2020-07-08 ENCOUNTER — Other Ambulatory Visit: Payer: Self-pay | Admitting: Internal Medicine

## 2020-07-08 DIAGNOSIS — F419 Anxiety disorder, unspecified: Secondary | ICD-10-CM

## 2020-07-16 ENCOUNTER — Ambulatory Visit (INDEPENDENT_AMBULATORY_CARE_PROVIDER_SITE_OTHER): Payer: Medicare HMO

## 2020-07-16 ENCOUNTER — Other Ambulatory Visit: Payer: Self-pay

## 2020-07-16 DIAGNOSIS — Z23 Encounter for immunization: Secondary | ICD-10-CM | POA: Diagnosis not present

## 2020-07-18 ENCOUNTER — Other Ambulatory Visit: Payer: Self-pay | Admitting: Internal Medicine

## 2020-08-03 ENCOUNTER — Other Ambulatory Visit: Payer: Self-pay | Admitting: *Deleted

## 2020-08-03 MED ORDER — PEN NEEDLES 33G X 4 MM MISC: 6 refills | Status: DC

## 2020-08-17 ENCOUNTER — Ambulatory Visit: Payer: Medicare HMO | Admitting: Dermatology

## 2020-09-14 ENCOUNTER — Other Ambulatory Visit: Payer: Self-pay

## 2020-09-14 ENCOUNTER — Other Ambulatory Visit (INDEPENDENT_AMBULATORY_CARE_PROVIDER_SITE_OTHER): Payer: Medicare HMO

## 2020-09-14 DIAGNOSIS — U071 COVID-19: Secondary | ICD-10-CM | POA: Diagnosis not present

## 2020-09-14 LAB — SARS CORONAVIRUS 2

## 2020-09-23 LAB — POC COVID19 BINAXNOW: SARS Coronavirus 2 Ag: POSITIVE — AB

## 2020-09-23 NOTE — Progress Notes (Signed)
oc

## 2020-11-11 ENCOUNTER — Other Ambulatory Visit: Payer: Self-pay | Admitting: Internal Medicine

## 2020-11-27 ENCOUNTER — Ambulatory Visit: Payer: Medicare HMO | Admitting: Physician Assistant

## 2020-11-27 ENCOUNTER — Other Ambulatory Visit: Payer: Self-pay | Admitting: Internal Medicine

## 2020-11-27 DIAGNOSIS — Z1331 Encounter for screening for depression: Secondary | ICD-10-CM

## 2020-12-07 ENCOUNTER — Encounter: Payer: Self-pay | Admitting: Dermatology

## 2020-12-07 ENCOUNTER — Ambulatory Visit: Payer: Medicare HMO | Admitting: Dermatology

## 2020-12-07 ENCOUNTER — Other Ambulatory Visit: Payer: Self-pay

## 2020-12-07 DIAGNOSIS — L578 Other skin changes due to chronic exposure to nonionizing radiation: Secondary | ICD-10-CM

## 2020-12-07 DIAGNOSIS — L821 Other seborrheic keratosis: Secondary | ICD-10-CM | POA: Diagnosis not present

## 2020-12-07 DIAGNOSIS — D492 Neoplasm of unspecified behavior of bone, soft tissue, and skin: Secondary | ICD-10-CM

## 2020-12-07 DIAGNOSIS — D485 Neoplasm of uncertain behavior of skin: Secondary | ICD-10-CM

## 2020-12-07 DIAGNOSIS — L918 Other hypertrophic disorders of the skin: Secondary | ICD-10-CM | POA: Diagnosis not present

## 2020-12-07 NOTE — Patient Instructions (Addendum)

## 2020-12-07 NOTE — Progress Notes (Signed)
   Follow-Up Visit   Subjective  Scott Doyle is a 60 y.o. male who presents for the following: Follow-up (Growth on the L post thigh, irritated by clothing and sitting ). His ISK of chest resolved after treatment last visit. He has other spots to be checked.  The following portions of the chart were reviewed this encounter and updated as appropriate:   Tobacco  Allergies  Meds  Problems  Med Hx  Surg Hx  Fam Hx     Review of Systems:  No other skin or systemic complaints except as noted in HPI or Assessment and Plan.  Objective  Well appearing patient in no apparent distress; mood and affect are within normal limits.  A focused examination was performed including Left leg, chest, neck . Relevant physical exam findings are noted in the Assessment and Plan.  Objective  Left posterior thigh, infragluteal: 1.1 pink irritated growth    Assessment & Plan  Neoplasm of skin Left posterior thigh, infragluteal  Epidermal / dermal shaving  Lesion diameter (cm):  1.1 Informed consent: discussed and consent obtained   Timeout: patient name, date of birth, surgical site, and procedure verified   Procedure prep:  Patient was prepped and draped in usual sterile fashion Prep type:  Isopropyl alcohol Anesthesia: the lesion was anesthetized in a standard fashion   Anesthetic:  1% lidocaine w/ epinephrine 1-100,000 buffered w/ 8.4% NaHCO3 Hemostasis achieved with: pressure, aluminum chloride and electrodesiccation   Outcome: patient tolerated procedure well   Post-procedure details: sterile dressing applied and wound care instructions given   Dressing type: bandage and petrolatum    Specimen 1 - Surgical pathology Differential Diagnosis: R/O Irritated tag vs other   Check Margins: No 1.1 pink irritated growth  Seborrheic Keratoses - Stuck-on, waxy, tan-brown papules and/or plaques  - Benign-appearing - Discussed benign etiology and prognosis. - Observe - Call for any  changes  Actinic Damage - chronic, secondary to cumulative UV radiation exposure/sun exposure over time - diffuse scaly erythematous macules with underlying dyspigmentation - Recommend daily broad spectrum sunscreen SPF 30+ to sun-exposed areas, reapply every 2 hours as needed.  - Recommend staying in the shade or wearing long sleeves, sun glasses (UVA+UVB protection) and wide brim hats (4-inch brim around the entire circumference of the hat). - Call for new or changing lesions.  Return if symptoms worsen or fail to improve.  IMarye Round, CMA, am acting as scribe for Sarina Ser, MD .  Documentation: I have reviewed the above documentation for accuracy and completeness, and I agree with the above.  Sarina Ser, MD

## 2020-12-09 ENCOUNTER — Telehealth: Payer: Self-pay

## 2020-12-09 NOTE — Telephone Encounter (Signed)
-----   Message from Ralene Bathe, MD sent at 12/08/2020  7:12 PM EDT ----- Diagnosis Skin , left posterior thigh, infragluteal [place]  Benign skin tag No further treatment needed

## 2020-12-09 NOTE — Telephone Encounter (Signed)
Advised patient of results/hd  

## 2020-12-11 ENCOUNTER — Ambulatory Visit: Payer: Medicare HMO | Admitting: Physician Assistant

## 2020-12-11 ENCOUNTER — Other Ambulatory Visit
Admission: RE | Admit: 2020-12-11 | Discharge: 2020-12-11 | Disposition: A | Discharge status: Home / Self Care | Payer: Medicare HMO | Source: Ambulatory Visit | Attending: Physician Assistant | Admitting: Physician Assistant

## 2020-12-11 ENCOUNTER — Encounter: Payer: Self-pay | Admitting: Physician Assistant

## 2020-12-11 ENCOUNTER — Telehealth: Payer: Self-pay | Admitting: Licensed Clinical Social Worker

## 2020-12-11 ENCOUNTER — Other Ambulatory Visit: Payer: Self-pay

## 2020-12-11 VITALS — BP 130/68 | HR 54 | Ht 67.0 in | Wt 169.0 lb

## 2020-12-11 DIAGNOSIS — Z951 Presence of aortocoronary bypass graft: Secondary | ICD-10-CM

## 2020-12-11 DIAGNOSIS — R0789 Other chest pain: Secondary | ICD-10-CM | POA: Diagnosis not present

## 2020-12-11 DIAGNOSIS — I25118 Atherosclerotic heart disease of native coronary artery with other forms of angina pectoris: Secondary | ICD-10-CM | POA: Diagnosis not present

## 2020-12-11 DIAGNOSIS — I10 Essential (primary) hypertension: Secondary | ICD-10-CM

## 2020-12-11 DIAGNOSIS — R079 Chest pain, unspecified: Secondary | ICD-10-CM | POA: Insufficient documentation

## 2020-12-11 DIAGNOSIS — R001 Bradycardia, unspecified: Secondary | ICD-10-CM

## 2020-12-11 DIAGNOSIS — I2510 Atherosclerotic heart disease of native coronary artery without angina pectoris: Secondary | ICD-10-CM | POA: Diagnosis not present

## 2020-12-11 DIAGNOSIS — E782 Mixed hyperlipidemia: Secondary | ICD-10-CM | POA: Diagnosis not present

## 2020-12-11 DIAGNOSIS — E119 Type 2 diabetes mellitus without complications: Secondary | ICD-10-CM | POA: Diagnosis not present

## 2020-12-11 DIAGNOSIS — R0600 Dyspnea, unspecified: Secondary | ICD-10-CM | POA: Diagnosis not present

## 2020-12-11 DIAGNOSIS — R0609 Other forms of dyspnea: Secondary | ICD-10-CM

## 2020-12-11 DIAGNOSIS — E785 Hyperlipidemia, unspecified: Secondary | ICD-10-CM

## 2020-12-11 LAB — COMPREHENSIVE METABOLIC PANEL
ALT: 38 U/L (ref 0–44)
AST: 34 U/L (ref 15–41)
Albumin: 4 g/dL (ref 3.5–5.0)
Alkaline Phosphatase: 80 U/L (ref 38–126)
Anion gap: 11 (ref 5–15)
BUN: 13 mg/dL (ref 8–23)
CO2: 21 mmol/L — ABNORMAL LOW (ref 22–32)
Calcium: 9.3 mg/dL (ref 8.9–10.3)
Chloride: 103 mmol/L (ref 98–111)
Creatinine, Ser: 0.79 mg/dL (ref 0.61–1.24)
GFR, Estimated: >60 mL/min (ref >60)
Glucose, Bld: 252 mg/dL — ABNORMAL HIGH (ref 70–99)
Potassium: 4.7 mmol/L (ref 3.5–5.1)
Sodium: 135 mmol/L (ref 135–145)
Total Bilirubin: 1 mg/dL (ref 0.3–1.2)
Total Protein: 7.6 g/dL (ref 6.5–8.1)

## 2020-12-11 LAB — LIPID PANEL
Cholesterol: 146 mg/dL (ref 0–200)
HDL: 27 mg/dL — ABNORMAL LOW (ref >40)
LDL Cholesterol: 74 mg/dL (ref 0–99)
Total CHOL/HDL Ratio: 5.4 RATIO
Triglycerides: 223 mg/dL — ABNORMAL HIGH (ref <150)
VLDL: 45 mg/dL — ABNORMAL HIGH (ref 0–40)

## 2020-12-11 MED ORDER — NITROGLYCERIN 0.4 MG SL SUBL: 0.4000 mg | SUBLINGUAL_TABLET | SUBLINGUAL | 3 refills | Status: AC | PRN

## 2020-12-11 NOTE — Progress Notes (Addendum)
Office Visit    Patient Name: Scott Doyle Date of Encounter: 12/11/2020  Primary Care Provider:  Cletis Athens, MD Primary Cardiologist:  Kathlyn Sacramento, MD  Chief Complaint    60 year old male with history of CAD s/p CABG x1, hypertension, hyperlipidemia, and DM 2, and who presents for 6 month follow-up of CAD and HTN.  Past Medical History    Past Medical History:  Diagnosis Date  . Anxiety   . Coronary artery disease    a. s/p prior CABG x 1 (LIMA->LAD) and PCI to the D2; b. 01/2012 PCI: patent prox Diag stent w/ sev distal dzs->DES x 1; c. 08/2014 Cath: LM nl, LAD 30m D2 patent stents w/ 374mLCX nl, RCA 40p, LIMA->LAD nl, EF 60%.  . Diabetes mellitus   . Hyperlipidemia   . Hypertension   [facility]   Past [facility] History:  Procedure Laterality Date  . CARDIAC CATHETERIZATION    . CARDIAC CATHETERIZATION  08/2014   Patent LIMA to LAD and patent diagonal stents.  . CORONARY ARTERY BYPASS GRAFT  2001   [facility]    Allergies  Allergies  Allergen Reactions  . Ampicillin Hives  . Penicillins Hives and Swelling    History of Present Illness    [age]ear old male with above past medical history and including CAD's s/p remote CABG x1 with placement of the LIMA to the LAD.  He subsequently underwent PCI to the second diagonal with repeat PCI in May 2013 secondary to more distal disease requiring drug-eluting stent placement.  His last cardiac catheterization was in December 2016, revealing patent diagonal stents.  He has since undergone treadmill testing in 2018, which was reportedly normal.  Other history includes hypertension, hyperlipidemia, anxiety, and DM2.  He was seen 05/2018, at which time he reported he was doing well from a cardiac standpoint.  He was fairly active in the yard without any exertional chest pain or pressure but sometimes did note chest wall soreness.  He thought this was different from prior angina and more likely musculoskeletal in  etiology, as there was some tenderness as well.  He reported that it did not typically limit his activities.   He was last seen in clinic 09/20/2019.  He noted left upper quadrant pain, described as different from previous chest wall soreness.  No clear exacerbating or alleviating factors.  He had chest wall soreness, attributed to lifting his grandson and the baby carrier/car seat.  He had occasional dizziness with plans to follow-up with his PCP for carotid studies.  He had occasional left ear tinnitus going to cut back salt, alcohol, and sugar.  He was excited to be taking care of his new his grandchild.  Today, 12/11/2020, he returns to clinic and notes he is doing well from a cardiac standpoint.  He continues to feel symptoms of dizziness, though on further discussion, these are consistent with orthostasis and mainly occur with position changes.  He does not drink very much water throughout the day.  He drinks diet Pepsi and coffee.  He wine at night.  Hydration discussed with patient report that his urine is amber in the morning at times.  He continues to note left upper quadrant pain, described as sharp cramps and occasionally associated with racing heart rate.  He reports at times his heart is beating hard but not skipping beats.  He mainly notices the sharp left upper quadrant pain at rest.  He also reports chest tightness at rest that is  not constant but comes and goes without any clear triggers.  This chest discomfort/tightness actually improves with walking around.  He also reports pain in the center of his back and between his shoulder blades.  He states that this feels like a depression in between his shoulder blades.  He reports dyspnea with exertion while walking the dog.  No shortness of breath at rest.  He denies any significant LAE, abdominal distention, PND, orthopnea, or early satiety.  No signs or symptoms of bleeding.  No syncope or falls.  He reports medication compliance.  He is currently  not checking his blood pressure at home with social work contacted for blood pressure cuff today.  Home Medications    Prior to Admission medications   Medication Sig Start Date End Date Taking? Authorizing Provider  aspirin 81 MG tablet Take 81 mg by mouth daily.   Yes [provider]  atenolol (TENORMIN) 25 MG tablet Take 1 tablet (25 mg total) by mouth daily. 05/11/18  Yes Theora Gianotti, NP  cholecalciferol (VITAMIN D) 1000 units tablet Take 1,000 Units by mouth daily. 03/20/18  Yes [provider]  glipiZIDE (GLUCOTROL XL) 2.5 MG 24 hr tablet Take 2.5 mg by mouth daily. 08/18/14  Yes [provider]  isosorbide mononitrate (IMDUR) 30 MG 24 hr tablet Take 3 tablets (90 mg total) by mouth daily. 05/11/18  Yes Theora Gianotti, NP  LANTUS SOLOSTAR 100 UNIT/ML Solostar Pen INJECT 10 UNITS DAILY 03/31/18  Yes [provider]  metFORMIN (GLUCOPHAGE) 1000 MG tablet Take 1 tablet by mouth Twice daily. 11/07/11  Yes [provider]  nitroGLYCERIN (NITROSTAT) 0.4 MG SL tablet Place 1 tablet (0.4 mg total) under the tongue every 5 (five) minutes as needed. Maximum of 3 doses. 05/11/18  Yes Theora Gianotti, NP  OMEPRAZOLE PO Take by mouth daily.   Yes [provider]  quinapril (ACCUPRIL) 10 MG tablet Take 1 tablet (10 mg total) by mouth daily. 05/11/18  Yes Theora Gianotti, NP  rosuvastatin (CRESTOR) 40 MG tablet Take 1 tablet (40 mg total) by mouth daily. 05/11/18  Yes Theora Gianotti, NP    Review of Systems    He reports racing HR, pounding heartbeat, dyspnea, chest tightness at rest without clear triggers that improves with ambulation, pain or pressure between his shoulder blades, intermittent/brief/sharp left upper quadrant pain without clear triggers, orthostasis.  He denies pnd, orthopnea, n, v, syncope, edema, weight gain, or early satiety.  All other systems reviewed and are otherwise negative except as  noted above.  Physical Exam    VS:  BP 130/68   Pulse (!) 54   Ht '5\' 7"'  (1.702 m)   Wt 169 lb (76.7 kg)   BMI 26.47 kg/m  , BMI Body mass index is 26.47 kg/m. GEN: Well nourished, well developed, in no acute distress. Mask in place. HEENT: normal. Neck: Supple, no JVD, carotid bruits, or masses. Cardiac: Bradycardic but regular, no murmurs, rubs, or gallops. No clubbing, cyanosis, or pitting edema.  Radials/DP/PT 2+ and equal bilaterally.  Respiratory:  Respirations regular and unlabored, clear to auscultation bilaterally. GI: Soft, nontender, nondistended, BS + x 4. MS: no deformity or atrophy. Skin: warm and dry, no rash. Neuro:  Strength and sensation are intact. Psych: Normal affect.  Accessory Clinical Findings    ECG personally reviewed by me today - SB, 54bpm, LVH/repolarization changes- no acute changes.  Filed Weights   12/11/20 0846  Weight: 169 lb (76.7 kg)  09/2019 clinic weight 177 pounds    Assessment & Plan    CAD s/p CABG x1 and diagonal stenting x2 --No current CP. Continues to report atypical CP and LUQ pain without clear triggers. LUQ pain appears most consistent with GI etiology. Considered though is MSK etiology given improvement in some positions.  He has DOE and chest tightness.  EKG without acute changes from previous. Most recent cath 2016.  Risk factors for coronary ischemia include known history of CAD s/p CABG, age, male, DM2, hypertension, hyperlipidemia.  Given new dyspnea and ongoing sx as above with known CAD/RF, recommend echo / MPI for further risk factor stratification. Escalation of atenolol precluded by lower HR today with h/o bradycardic rates. Continue current ASA, ACE, BB, nitrates, and statin. Refill SL nitro.   Essential Hypertension, goal < 130/80  --BP borderline at 130/68 with fluid and salt restrictions again reviewed.  Activity/daily exercise discussed.  Discussed possible increase of Imdur versus quinapril for further BP support.   Obtain BMET with titration of ACE versus Imdur pending labs.  Escalation of atenolol precluded by history of bradycardic rates.  Recommend home BP monitoring with social work contacted for blood pressure cuff.  If home BP remains consistently elevated over 130/80, he will call the office, at which time further antihypertensive medications should be adjusted to achieve goal pressures.  Orthostasis symptoms  --Reports dizziness with position changes.  Recommend slow position changes.  Suspect that his orthostasis is due to dehydration, given his urine is amber at times and report of low water intake.  Recommend decrease caffeine and remain very well-hydrated.  Denies any amaurosis fugax or signs of worsening carotid disease.  Defer repeat carotids. Hydration recommended, though also recommended is fluid under 2L and salt under 2g.  Echo and stress test ordered as above.  Reported Racing HR Sinus bradycardia --SB today and without arrhythmia or ectopy.  Denies any associated dizziness with bradycardic rate today.  Will check BMET/electrolytes. Recommended hydration, though with total daily fluids under 2L per day and salt under 2g.  Limit caffeine intake.  Discussed rebound effect of alcohol and influence on BP and hydration level with pt understanding.  Reassess at RTC.  HLD, LDL goal <70 --Continue Crestor.  Will recheck lipid and liver function today.  If LDL not at goal, consider addition of Zetia at that time.  DM2 --Per PCP.  Glycemic control recommended for aggressive risk factor modification.  Continue quinapril given comorbid hypertension and diabetes.  Could consider Farxiga at RTC.  Will defer for now.   Medication changes: Pending labs/home BP checks Labs ordered: C-Met, lipid Studies / Imaging ordered: Echo, MPI.  Social work contacted for BP cuff (will mail to patient) Future considerations: Increased quinapril versus Imdur/Farxiga Disposition: RTC after MPI/echo  Total time spent  with patient today 40 minutes. This includes reviewing records, evaluating the patient, and coordinating care. Face-to-face time >50%.    Arvil Chaco, PA-C 12/11/2020, 9:06 AM

## 2020-12-11 NOTE — Patient Instructions (Addendum)
Medication Instructions:  Your physician recommends that you continue on your current medications as directed. Please refer to the Current Medication list given to you today.  We sent a refill for your Nitroglycerin today.  *If you need a refill on your cardiac medications before your next appointment, please call your pharmacy*   Lab Work:  Your physician recommends that you have lab work TODAY at the [facility] at [facility]: Lipid panel, [place] -  Please go to the [facility]. You will check in at the front desk to the right as you walk into the atrium.   If you have labs (blood work) drawn today and your tests are completely normal, you will receive your results only by: Marland Kitchen MyChart Message (if you have MyChart) OR . A paper copy in the mail If you have any lab test that is abnormal or we need to change your treatment, we will call you to review the results.   Testing/Procedures:  1) Your physician has requested that you have an echocardiogram. Echocardiography is a painless test that uses sound waves to create images of your heart. It provides your doctor with information about the size and shape of your heart and how well your heart's chambers and valves are working. This procedure takes approximately one hour. There are no restrictions for this procedure.  2) [place]  Your caregiver has ordered a Stress Test with nuclear imaging (Lexiscan Myoview). The purpose of this test is to evaluate the blood supply to your heart muscle. This procedure is referred to as a "Non-Invasive Stress Test." This is because other than having an IV started in your vein, nothing is inserted or "invades" your body. Cardiac stress tests are done to find areas of poor blood flow to the heart by determining the extent of coronary artery disease (CAD). Some patients exercise on a treadmill, which naturally increases the blood flow to your heart, while others who are  unable to walk on a treadmill due to  physical limitations have a pharmacologic/chemical stress agent called Lexiscan . This medicine will mimic walking on a treadmill by temporarily increasing your coronary blood flow.   Please note: these test may take anywhere between 2-4 hours to complete  PLEASE REPORT TO [place] AT THE FIRST DESK WILL DIRECT YOU WHERE TO GO  Date of Procedure:_____________________________________  Arrival Time for Procedure:______________________________  Instructions regarding medication:   __X__ : Hold diabetes medication morning of procedure               Do not take Metformin or Glipizide morning of procedure              Take HALF dose of insulin the evening before procedure              No insulin morning of procedure  __X__:  Hold betablocker(s) night before procedure and morning of procedure              Do not take Atenolol    PLEASE NOTIFY THE OFFICE AT LEAST 24 HOURS IN ADVANCE IF YOU ARE UNABLE TO KEEP YOUR APPOINTMENT.  [phone] AND  PLEASE NOTIFY NUCLEAR MEDICINE AT [facility] AT LEAST 24 HOURS IN ADVANCE IF YOU ARE UNABLE TO KEEP YOUR APPOINTMENT. [phone]  How to prepare for your Myoview test:  1. Do not eat or drink after midnight 2. No caffeine for 24 hours prior to test 3. No smoking 24 hours prior to test. 4.  Your medication may be taken with water.  If your doctor stopped a medication because of this test, do not take that medication. 5. Ladies, please do not wear dresses.  Skirts or pants are appropriate. Please wear a short sleeve shirt. 6. No perfume, cologne or lotion.    Follow-Up: At [facility], you and your health needs are our priority.  As part of our continuing mission to provide you with exceptional heart care, we have created designated Provider Care Teams.  These Care Teams include your primary Cardiologist (physician) and Advanced Practice Providers (APPs -  Physician Assistants and Nurse Practitioners) who all work  together to provide you with the care you need, when you need it.  We recommend signing up for the patient portal called "MyChart".  Sign up information is provided on this After Visit Summary.  MyChart is used to connect with patients for Virtual Visits (Telemedicine).  Patients are able to view lab/test results, encounter notes, upcoming appointments, etc.  Non-urgent messages can be sent to your provider as well.   To learn more about what you can do with MyChart, go to [email or web address].    Your next appointment:    Follow up after Lexiscan and Echo  The format for your next appointment:   In Person  Provider:   You may see Kathlyn Sacramento, MD or one of the following Advanced Practice Providers on your designated Care Team:     Marrianne Mood, PA-C   Other Instructions  If chest pain or shortness of breath worsens go to the Emergency Room.   Remember to drink more water.  We recommend a maximum of 2g sodium per day and 2L total fluid per day. Fluids include coffee, tea, water, and juice.  In addition, we recommend you monitor both your daily weight and daily BP at the same time each day - bring this long into the office.   Blood pressure: --We recommend upper arm BP cuffs over that of the wrist. --You can always check your device's accuracy against an office model once a year if possible. You can do so by bringing your cuff into the office and notifying the person that rooms you that you would like to check your cuff against our office readings. --Measure your BP at the same time each day. Blood pressure varies often throughout the day with higher readings in the morning. BP may also be lower at home than in the office. Do not measure BP right after you wake up or after exercising. Avoid caffeine, tobacco, and alcohol for 30 minutes before taking a measurement.  --Sit quietly for five minutes in a comfortable position with your legs and ankles uncrossed and back supported.  Feet flat on the ground. Have your arm supported and at the level of your heart. Always use the same arm when taking your blood pressure. Place the cuff over bare skin rather than clothing. Each time you measure, take an additional reading if abnormal to ensure accurate by waiting 1-3 minutes after the first reading. --Please bring a BP log into the office. It is helpful to document the time of each BP reading, as well as any activity or medications taken around the reading. In addition, it is helpful to include heart rate at the time of the BP reading. Daily weights are also encouraged. --Goal BP is 130/80 or lower. If your BP is low but you are not dizzy, this is fine and preferred over BP higher than 130/80. If your BP  is less than 100 for the top number and you are dizzy, call the office. If your blood pressure is consistently elevated with top number above 180 and bottom above 120, this can damage the body. If you have severe increase in your blood pressure or concerning symptoms of severe chest pain, headache with confusion and blurred vision, severe abdominal or back pain, shortness of breath, seizures, or loss of consciousness, go to the emergency department.

## 2020-12-11 NOTE — Telephone Encounter (Signed)
CSW referred to assist patient with obtaining a BP cuff. CSW contacted patient to inform cuff will be delivered to home. Patient grateful for support and assistance. CSW available as needed. Jackie Kasaundra Fahrney, LCSW, CCSW-MCS [phone]  

## 2020-12-15 ENCOUNTER — Other Ambulatory Visit: Payer: Self-pay

## 2020-12-15 ENCOUNTER — Telehealth: Payer: Self-pay | Admitting: *Deleted

## 2020-12-15 ENCOUNTER — Encounter
Admission: RE | Admit: 2020-12-15 | Discharge: 2020-12-15 | Disposition: A | Discharge status: Home / Self Care | Payer: Medicare HMO | Source: Ambulatory Visit | Attending: Physician Assistant | Admitting: Physician Assistant

## 2020-12-15 DIAGNOSIS — I25118 Atherosclerotic heart disease of native coronary artery with other forms of angina pectoris: Secondary | ICD-10-CM | POA: Insufficient documentation

## 2020-12-15 DIAGNOSIS — I119 Hypertensive heart disease without heart failure: Secondary | ICD-10-CM

## 2020-12-15 DIAGNOSIS — E782 Mixed hyperlipidemia: Secondary | ICD-10-CM

## 2020-12-15 DIAGNOSIS — R0789 Other chest pain: Secondary | ICD-10-CM | POA: Insufficient documentation

## 2020-12-15 LAB — NM MYOCAR MULTI W/SPECT W/WALL MOTION / EF
LV dias vol: 58 mL (ref 62–150)
LV sys vol: 15 mL
Peak HR: 100 {beats}/min
Percent HR: 62 %
Rest HR: 63 {beats}/min
TID: 1.16

## 2020-12-15 MED ORDER — REGADENOSON 0.4 MG/5ML IV SOLN
0.4000 mg | Freq: Once | INTRAVENOUS | Status: AC
  Administered 2020-12-15: 0.4 mg via INTRAVENOUS

## 2020-12-15 MED ORDER — TECHNETIUM TC 99M TETROFOSMIN IV KIT
10.0000 | PACK | Freq: Once | INTRAVENOUS | Status: AC | PRN
  Administered 2020-12-15: 10.07 via INTRAVENOUS

## 2020-12-15 MED ORDER — TECHNETIUM TC 99M TETROFOSMIN IV KIT
30.0000 | PACK | Freq: Once | INTRAVENOUS | Status: AC | PRN
  Administered 2020-12-15: 30.645 via INTRAVENOUS

## 2020-12-15 MED ORDER — ISOSORBIDE MONONITRATE ER 30 MG PO TB24: 120.0000 mg | ORAL_TABLET | Freq: Every day | ORAL | 1 refills | Status: DC

## 2020-12-15 MED ORDER — EZETIMIBE 10 MG PO TABS: 10.0000 mg | ORAL_TABLET | Freq: Every day | ORAL | 3 refills | Status: DC

## 2020-12-15 NOTE — Telephone Encounter (Signed)
Spoke to pt. Notified of lab results and provider's recc. Pt verbalized understanding. He will: -Start Zetia 10mg  daily  -Incr Imdur to 120mg  daily -[facility] in 6-8 weeks for lipid/liver panel -Reach out to PCP re glucose level  Rx sent to pharmacy for Zetia and incr dose Imdur.  Lab orders placed.  Pt has no further questions at this time.

## 2020-12-15 NOTE — Telephone Encounter (Signed)
-----   Message from Arvil Chaco, PA-C sent at 12/13/2020 11:54 AM EDT ----- Labs show --Kidney function stable  --Electrolytes stable --Glucose elevated at 252 --Liver function stable --LDL slightly above goal at 74 (goal 70). --Triglycerides elevated at 223  Recommendations: --If agreeable, add Zetia 10 mg daily. --Repeat lipid and liver function in 6-8 weeks to recheck again --If agreeable, increase to Imdur 120mg  daily  --Recommend reach out to PCP regarding glucose to make sure well controlled

## 2021-01-02 ENCOUNTER — Other Ambulatory Visit: Payer: Self-pay | Admitting: Internal Medicine

## 2021-01-02 DIAGNOSIS — I119 Hypertensive heart disease without heart failure: Secondary | ICD-10-CM

## 2021-01-05 ENCOUNTER — Other Ambulatory Visit: Payer: Self-pay | Admitting: Internal Medicine

## 2021-01-05 DIAGNOSIS — I517 Cardiomegaly: Secondary | ICD-10-CM

## 2021-01-06 ENCOUNTER — Other Ambulatory Visit: Payer: Self-pay

## 2021-01-06 ENCOUNTER — Ambulatory Visit (INDEPENDENT_AMBULATORY_CARE_PROVIDER_SITE_OTHER): Payer: Medicare HMO

## 2021-01-06 DIAGNOSIS — I25118 Atherosclerotic heart disease of native coronary artery with other forms of angina pectoris: Secondary | ICD-10-CM | POA: Diagnosis not present

## 2021-01-06 DIAGNOSIS — R0789 Other chest pain: Secondary | ICD-10-CM | POA: Diagnosis not present

## 2021-01-06 LAB — ECHOCARDIOGRAM COMPLETE
AR max vel: 3.85 cm2
AV Area VTI: 3.83 cm2
AV Area mean vel: 3.59 cm2
AV Mean grad: 3 mmHg
AV Peak grad: 5.6 mmHg
Ao pk vel: 1.18 m/s
Area-P 1/2: 4.41 cm2
S' Lateral: 3.3 cm

## 2021-01-07 LAB — HM DIABETES EYE EXAM

## 2021-01-11 ENCOUNTER — Other Ambulatory Visit: Payer: Self-pay

## 2021-01-11 ENCOUNTER — Ambulatory Visit: Payer: Medicare HMO | Admitting: Physician Assistant

## 2021-01-11 ENCOUNTER — Encounter: Payer: Self-pay | Admitting: Physician Assistant

## 2021-01-11 VITALS — BP 148/84 | HR 62 | Ht 67.0 in | Wt 172.0 lb

## 2021-01-11 DIAGNOSIS — I25118 Atherosclerotic heart disease of native coronary artery with other forms of angina pectoris: Secondary | ICD-10-CM

## 2021-01-11 DIAGNOSIS — Z8679 Personal history of other diseases of the circulatory system: Secondary | ICD-10-CM | POA: Diagnosis not present

## 2021-01-11 DIAGNOSIS — Z87898 Personal history of other specified conditions: Secondary | ICD-10-CM

## 2021-01-11 DIAGNOSIS — R0789 Other chest pain: Secondary | ICD-10-CM

## 2021-01-11 DIAGNOSIS — I5189 Other ill-defined heart diseases: Secondary | ICD-10-CM | POA: Diagnosis not present

## 2021-01-11 DIAGNOSIS — Z951 Presence of aortocoronary bypass graft: Secondary | ICD-10-CM

## 2021-01-11 DIAGNOSIS — E119 Type 2 diabetes mellitus without complications: Secondary | ICD-10-CM | POA: Diagnosis not present

## 2021-01-11 DIAGNOSIS — I10 Essential (primary) hypertension: Secondary | ICD-10-CM

## 2021-01-11 DIAGNOSIS — E785 Hyperlipidemia, unspecified: Secondary | ICD-10-CM

## 2021-01-11 MED ORDER — TORSEMIDE 10 MG PO TABS: ORAL_TABLET | ORAL | 3 refills | Status: DC

## 2021-01-11 MED ORDER — ISOSORBIDE MONONITRATE ER 120 MG PO TB24: 120.0000 mg | ORAL_TABLET | Freq: Every day | ORAL | 3 refills | Status: DC

## 2021-01-11 NOTE — Patient Instructions (Signed)
Medication Instructions:  Your physician has recommended you make the following change in your medication:   We have sent in a new Rx for Isosorbide 120mg  daily - Take 1 tablet daily  START Torsemide 10mg  daily x 3 days THEN take AS NEEDED per sliding scale instructions below:  Sliding scale Torsemide: Weigh yourself when you get home, then Daily in the Morning. Your dry weight will be what your scale says on the day you return home.(here is 172 lbs.).   Weight yourself daily and in the morning. Log these weights. Bring them to your next clinic visit. It is also helpful to bring blood pressure and heart rate readings to your next visit if possible.  If you gain (a) more than 3 pounds from dry weight in 1 day or (b) approximately 5 lbs in 1 week from dry weight, then: take ONE Torsemide 10mg  pill daily until weight returns to baseline dry weight +/- shortness of breath improves +/-  swelling improves of legs or belly.   Call the office if taking extra Torsemide daily for 1 week, so that we may reassess your status and check blood work to ensure proper kidney and electrolyte function. Call the office if your weight drops below that of your dry weight by more than 3 lbs.    *If you need a refill on your cardiac medications before your next appointment, please call your pharmacy*   Lab Work:  We will check lab work at your next office visit in 1 month in the clinic.   Testing/Procedures: None ordered   Follow-Up: At [facility], you and your health needs are our priority.  As part of our continuing mission to provide you with exceptional heart care, we have created designated Provider Care Teams.  These Care Teams include your primary Cardiologist (physician) and Advanced Practice Providers (APPs -  Physician Assistants and Nurse Practitioners) who all work together to provide you with the care you need, when you need it.  We recommend signing up for the patient portal called  "MyChart".  Sign up information is provided on this After Visit Summary.  MyChart is used to connect with patients for Virtual Visits (Telemedicine).  Patients are able to view lab/test results, encounter notes, upcoming appointments, etc.  Non-urgent messages can be sent to your provider as well.   To learn more about what you can do with MyChart, go to [email or web address].    Your next appointment:   1 month(s)  The format for your next appointment:   In Person  Provider:   You may see Kathlyn Sacramento, MD or one of the following Advanced Practice Providers on your designated Care Team:     Marrianne Mood, [place]

## 2021-01-11 NOTE — Progress Notes (Signed)
Office Visit    Patient Name: Scott Doyle Date of Encounter: 01/11/2021  Primary Care Provider:  Cletis Athens, MD Primary Cardiologist:  Kathlyn Sacramento, MD  Chief Complaint    60 year old male with history of CAD s/p CABG x1, hypertension, hyperlipidemia, and DM 2, and who presents for follow-up after recent MPI/echo.  Past Medical History    Past Medical History:  Diagnosis Date  . Anxiety   . Coronary artery disease    a. s/p prior CABG x 1 (LIMA->LAD) and PCI to the D2; b. 01/2012 PCI: patent prox Diag stent w/ sev distal dzs->DES x 1; c. 08/2014 Cath: LM nl, LAD 71m, D2 patent stents w/ 28m, LCX nl, RCA 40p, LIMA->LAD nl, EF 60%.  . Diabetes mellitus   . Hyperlipidemia   . Hypertension   . Thrombocytopathia [facility])    Past Surgical History:  Procedure Laterality Date  . CARDIAC CATHETERIZATION    . CARDIAC CATHETERIZATION  08/2014   Patent LIMA to LAD and patent diagonal stents.  . CORONARY ARTERY BYPASS GRAFT  2001   [facility]    Allergies  Allergies  Allergen Reactions  . Ampicillin Hives  . Penicillins Hives and Swelling    History of Present Illness    60 year old male with above past medical history and including CAD's s/p remote CABG x1 with placement of the LIMA to the LAD.  He subsequently underwent PCI to the second diagonal with repeat PCI in May 2013 secondary to more distal disease requiring drug-eluting stent placement.  His last cardiac catheterization was in December 2016, revealing patent diagonal stents.  He has since undergone treadmill testing in 2018, which was reportedly normal.  Other history includes hypertension, hyperlipidemia, anxiety, and DM2.  He was seen 05/2018, at which time he reported he was doing well from a cardiac standpoint.  He was fairly active in the yard without any exertional chest pain or pressure but sometimes did note chest wall soreness.  He thought this was different from prior angina and more likely musculoskeletal in  etiology, as there was some tenderness as well.  He reported that it did not typically limit his activities.   He was last seen in clinic 09/20/2019.  He noted left upper quadrant pain, described as different from previous chest wall soreness.  No clear exacerbating or alleviating factors.  He had chest wall soreness, attributed to lifting his grandson and the baby carrier/car seat.  He had occasional dizziness with plans to follow-up with his PCP for carotid studies.  He had occasional left ear tinnitus going to cut back salt, alcohol, and sugar.  He was excited to be taking care of his new his grandchild.  Seen 12/11/2020 and reported symptoms of dizziness, thought consistent with his orthostasis and occurring mainly with position changes.  He was not drinking much water throughout the day and did report drinking diet Pepsi and coffee.  He reported nightly wine.  Hydration encouraged, given report of amber urine at times.  He noted left upper quadrant pain, described as sharp cramping and occasionally associated with racing heart rate.  He stated he mainly noted the left upper quadrant pain at rest.  At times, he felt as if his heart was beating hard.  He reported chest tightness that was not constant but rather came and went without clear triggers.  Chest discomfort/tightness improved with walking around/exertion.  He reported pain in the center of his back and between his shoulder blades.  At times, it  felt as if someone was pressing between his shoulder blades.  He reported dyspnea on exertion while walking the dog, which was concerning to him.  No shortness of breath at rest.    Recommendations were for echo and MPI, based on his symptoms above.  Social work contacted for BP cuff and with patient reporting successful receipt of BP cuff today.  Future considerations noted included increased quinapril versus Imdur/Farxiga.  12/15/2020 MPI was without significant ischemia or scar and ruled in overall low risk  study.  Attenuation correction CT showed post CABG findings and coronary artery calcification.  Risk factor modification recommended.  01/06/2021 echo showed EF 60 to 65%, NR WMA, G2 DD, mild LAE.  We reviewed that his EF improved from previous report of 45% with patient understanding.  BP control recommended.  Today, 01/11/2021, he returns to clinic to review the studies.  MPI and echo reviewed with patient understanding.  He reports resolution of previous chest pain.  No further stabbing pain.  He continues to have sporadic pain between his shoulder blades without clear triggers.  He reports complete resolution of tachypalpitations.  No further left upper quadrant pain/cramping at rest.  He is unsure if he would have further dyspnea when walking the dog, as he has not recently gone back to the dog park and just throws a ball to the dog.  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  He received the BP cuff through social work and notes that his blood pressure has been up lately at home with SBP 160s to 180s and DBP 80s to 90s.  He also notes an increase in his weight.  He denies any increase in fluids and continues to note drinking less water than he probably should.  He denies any dizziness with position changes today/no further orthostasis as noted at prior visits.  He continues to make slow position changes.  He wonders if his dizziness is due to his vision.  He reports that he needs to go to the eye doctor.  He has not noted any further urine that is amber in color with the exception of occasional amber urine after drinking wine the previous night.  He enjoys spending time with his grandson, 63 years old.  He reports developing trigger finger in his hands.  He reports avoiding salt, though he does note the occasional shake of salt on his food.  He enjoys pepper, [employer], peppers, and vinegar he states.  Of note, Reds vest today 37% with  patient volume up on exam, though he denies symptoms of volume overload as outlined directly above.  No signs or symptoms of bleeding.  He reports medication compliance.  He is satisfied with the results of his study.  He wonders about his elevated blood pressure.  Home Medications    Prior to Admission medications   Medication Sig Start Date End Date Taking? Authorizing Provider  aspirin 81 MG tablet Take 81 mg by mouth daily.   Yes [provider]  atenolol (TENORMIN) 25 MG tablet Take 1 tablet (25 mg total) by mouth daily. 05/11/18  Yes Theora Gianotti, NP  cholecalciferol (VITAMIN D) 1000 units tablet Take 1,000 Units by mouth daily. 03/20/18  Yes [provider]  glipiZIDE (GLUCOTROL XL) 2.5 MG 24 hr tablet Take 2.5 mg by mouth daily. 08/18/14  Yes [provider]  isosorbide mononitrate (IMDUR) 30 MG 24 hr tablet  Take 3 tablets (90 mg total) by mouth daily. 05/11/18  Yes Theora Gianotti, NP  LANTUS SOLOSTAR 100 UNIT/ML Solostar Pen INJECT 10 UNITS DAILY 03/31/18  Yes [provider]  metFORMIN (GLUCOPHAGE) 1000 MG tablet Take 1 tablet by mouth Twice daily. 11/07/11  Yes [provider]  nitroGLYCERIN (NITROSTAT) 0.4 MG SL tablet Place 1 tablet (0.4 mg total) under the tongue every 5 (five) minutes as needed. Maximum of 3 doses. 05/11/18  Yes Theora Gianotti, NP  OMEPRAZOLE PO Take by mouth daily.   Yes [provider]  quinapril (ACCUPRIL) 10 MG tablet Take 1 tablet (10 mg total) by mouth daily. 05/11/18  Yes Theora Gianotti, NP  rosuvastatin (CRESTOR) 40 MG tablet Take 1 tablet (40 mg total) by mouth daily. 05/11/18  Yes Theora Gianotti, NP    Review of Systems    He reports resolution of previously reported racing HR, pounding heartbeat. He is uncertain if still experiencing dyspnea, as he does not walk the dog and is relatively sedentary.  He reports resolution of previous chest tightness at rest  that improved with ambulation. He continues to experience pressure between his shoulder blades without clear triggers.  No further intermittent/brief/sharp left upper quadrant pain.  No further orthostasis with slow position changes and patient wondering if his vision contributes to this issue.  He denies pnd, orthopnea, n, v, syncope, edema, weight gain, or early satiety.  All other systems reviewed and are otherwise negative except as noted above.  Physical Exam    VS:  BP (!) 148/84 (BP Location: Left Arm, Patient Position: Sitting, Cuff Size: Normal)   Pulse 62   Ht 5\' 7"  (1.702 m)   Wt 172 lb (78 kg)   SpO2 97%   BMI 26.94 kg/m  , BMI Body mass index is 26.94 kg/m. GEN: Well nourished, well developed, in no acute distress. Mask in place. HEENT: normal. Neck: Supple, JVP approximately 10 cm.  No carotid bruits or masses. Cardiac: Regular rate and rhythm, 1/6 systolic murmur.  No rubs or gallops. No clubbing, cyanosis, or pitting edema.  Radials/DP/PT 2+ and equal bilaterally.  Respiratory:  Respirations regular and unlabored, bibasilar crackles. GI: Firm, distended, BS + x 4. MS: no deformity or atrophy. Skin: warm and dry, no rash. Neuro:  Strength and sensation are intact. Psych: Normal affect.  Accessory Clinical Findings    ECG personally reviewed by me today -no EKG.  Filed Weights   01/11/21 1503  Weight: 172 lb (78 kg)  09/2019 clinic weight 177 pounds  BP today 148/84 with clinic weight 172 and Reds vest 37%  Echo 01/2021 1. Left ventricular ejection fraction, by estimation, is 60 to 65%. The  left ventricle has normal function. The left ventricle has no regional  wall motion abnormalities. Left ventricular diastolic parameters are  consistent with Grade II diastolic  dysfunction (pseudonormalization).  2. Right ventricular systolic function is normal. The right ventricular  size is normal.  3. Left atrial size was mildly dilated.  4. The mitral valve is  normal in structure. No evidence of mitral valve  regurgitation.  5. The aortic valve is tricuspid. Aortic valve regurgitation is not  visualized. Mild aortic valve sclerosis is present, with no evidence of  aortic valve stenosis.  Comparison(s): Previous LVEF reported as 45%.   MPI  Normal pharmacologic myocardial perfusion stress test without significant ischemia or scar.  The left ventricular ejection fraction is normal (LVEF 53% by QGS, 74% by Siemens).  Attenuation correction CT is notable for post CABG findings and coronary artery calcification.  This is a low risk study.    Assessment & Plan    Acute on chronic HFpEF/G2DD (EF 60 to 65% 01/2021) -- Reports abdominal distention with exam consistent with volume overload.  Reds vest elevated at 37%.  Recent echo as above with EF 60 to 65%, NR WMA, G2 DD.  Weight and BP elevated.  Given volume up on exam with elevated Reds vest, will initiate low-dose diuretic.  Plan for torsemide 10 mg x 3 days and then continue on a sliding scale basis for 3 pound weight gain overnight or 5 pound weight gain in 1 week thereafter.  Sliding scale was discussed with patient during clinic visit and recommendations regarding sliding scale details provided on AVS.  Will check BMET today and at RTC.  Reviewed recommendations for fluid and salt intake with patient understanding.  Recommend ongoing home BP and weight monitoring.    CAD s/p CABG x1 and diagonal stenting x2 --No current CP.  Resolution of previously atypical chest pain and LUQ pain without clear triggers.  He is uncertain if he is still experiencing DOE.  He notes ongoing pain between his shoulder blades.  Most recent echo as above with EF normal and NR WMA.  MPI ruled low risk.  Most recent cath 2016 as above.  Escalation of current beta-blocker precluded by lower HR today with h/o bradycardic rates.  We will continue to defer.  Aggressive risk factor modification recommended.  Recommend increase  activity as tolerated and ongoing heart healthy diet changes.  Continue current ASA, ACE, BB, nitrates, and statin.  Imdur prescription consolidated with new prescription for isosorbide 120 mg daily to prevent he is needing to take 4 pills per dose with patient appreciation.   Essential Hypertension, goal < 130/80  --BP elevated in the setting of increased volume status.  Plan for initiation of torsemide 10 mg daily x3 days and then as needed torsemide thereafter.  Suspect improvement of BP s/p diuresis.  Will check BMET today and at RTC.  Adjustment to current diuresis schedule will be made if indicated based on labs.  Activity/daily exercise discussed. Escalation of atenolol precluded by history of bradycardic rates.  Continue Imdur, quinapril, atenolol.  He has received his BP cuff from social work and will continue to monitor his BP at home, as well as daily weights.  Reviewed salt and fluid restrictions.  If home BP remains consistently elevated over 130/80, he will call the office, at which time further antihypertensive medications should be adjusted to achieve goal pressures.    Orthostasis symptoms  --Reports resolution of previous dizziness with position changes.  Continue to recommend slow position changes and reduce caffeine/wine intake.  Recommend hydration, especially with initiation of diuresis; however, with total daily fluid intake under 2 L/day and salt intake under 2 g/day.  He wonders if his symptoms are 2/2 vision changes with patient report that he will go to the eye doctor soon.  He denies any amaurosis fugax or signs of worsening carotid disease.  Defer repeat carotids.  Echo obtained as above with EF WNL and NR WMA.  MPI ruled low risk.  History of racing heart rate H/o sinus bradycardia -- Not bradycardic today and extrasystole not appreciated on cardiac auscultation.  He notes resolution of previously reported elevated heart rate.  Will check BMET/electrolytes. Recommended  hydration, though with total daily fluids under 2L per day and salt under 2g.  Limit caffeine intake.  Discussed rebound effect of alcohol and influence on BP and hydration level with pt understanding.  As above, escalation of beta-blocker precluded by history of sinus bradycardia.  HLD, LDL goal <70 --Continue Crestor.  He is tolerating Zetia 10 mg daily well, which was added between visits due to LDL above goal.  Continue to monitor LDL with repeat LDL in approximately 6 weeks.  DM2 --Per PCP.  Glycemic control recommended for aggressive risk factor modification.  Continue quinapril given comorbid hypertension and diabetes.  Could consider Farxiga at RTC.  Will defer for now.   Medication changes: Initiation of torsemide 10 mg x 3 days then sliding scale torsemide thereafter Labs ordered: BMET, lipid/LDL in 6 weeks Studies / Imaging ordered: None Future considerations: Pending BMET, adjustment of medications if needed.  Repeat LDL in 6 weeks.  Addition of SGLT2 inhibitor at RTC Disposition: RTC 1 month  *Please be aware that the above documentation was completed voice recognition software and may contain associated dictation errors.    Arvil Chaco, PA-C 01/11/2021, 1:41 PM

## 2021-01-25 ENCOUNTER — Other Ambulatory Visit: Payer: Self-pay

## 2021-02-10 ENCOUNTER — Telehealth: Payer: Self-pay | Admitting: Physician Assistant

## 2021-02-10 NOTE — Telephone Encounter (Signed)
   Patient Name: Scott Doyle   Primary Cardiologist: Kathlyn Sacramento, MD  Chart reviewed as part of pre-operative protocol coverage.   Simple dental extractions (1-2) are considered low risk procedures per guidelines and generally do not require any specific cardiac clearance. It is also generally accepted that for simple extractions and dental cleanings, there is no need to interrupt blood thinner therapy. Per surgical request, patients extractions (4 ) will take place over several encounters therefore not considered a complex dental procedure. If in fact all four are to be taken out at once please send request back for further review.   SBE prophylaxis is not required for the patient from a cardiac standpoint.  I will route this recommendation to the requesting party via Epic fax function and remove from pre-op pool.  Please call with questions.  Kathyrn Drown, NP 02/10/2021, 4:10 PM

## 2021-02-10 NOTE — Telephone Encounter (Signed)
   [facility] Pre-operative Risk Assessment    Patient Name: ANTHON HARPOLE  DOB: Jul 04, 1961  MRN: [id number]   HEARTCARE STAFF: - Please ensure there is not already an duplicate clearance open for this procedure. - Under Visit Info/Reason for Call, type in Other and utilize the format Clearance MM/DD/YY or Clearance TBD. Do not use dashes or single digits. - If request is for dental extraction, please clarify the # of teeth to be extracted. - If the patient is currently at the dentist's office, call Pre-Op APP to address. If the patient is not currently in the dentist office, please route to the Pre-Op pool  Request for surgical clearance:  1. What type of surgery is being performed? Cleaning, extractions (4 extractions over time)  2. When is this surgery scheduled? TBD  3. What type of clearance is required (medical clearance vs. Pharmacy clearance to hold med vs. Both)? Both. Previous stent and cardiac cath  4. Are there any medications that need to be held prior to surgery and how long? Are any antibiotics needed  5. Practice name and name of physician performing surgery? [facility]  6. What is the office phone number? [phone]   7.   What is the office fax number? [phone]  8.   Anesthesia type (None, local, MAC, general) ? local   Marykay Lex 02/10/2021, 3:48 PM  _________________________________________________________________   (provider comments below)

## 2021-02-15 ENCOUNTER — Other Ambulatory Visit: Payer: Self-pay

## 2021-02-15 ENCOUNTER — Other Ambulatory Visit
Admission: RE | Admit: 2021-02-15 | Discharge: 2021-02-15 | Disposition: A | Discharge status: Home / Self Care | Payer: Medicare HMO | Attending: Physician Assistant | Admitting: Physician Assistant

## 2021-02-15 ENCOUNTER — Encounter: Payer: Self-pay | Admitting: Physician Assistant

## 2021-02-15 ENCOUNTER — Ambulatory Visit (INDEPENDENT_AMBULATORY_CARE_PROVIDER_SITE_OTHER): Payer: Medicare HMO | Admitting: Physician Assistant

## 2021-02-15 VITALS — BP 160/80 | HR 62 | Resp 19 | Ht 67.0 in | Wt 169.4 lb

## 2021-02-15 DIAGNOSIS — I5189 Other ill-defined heart diseases: Secondary | ICD-10-CM

## 2021-02-15 DIAGNOSIS — I2510 Atherosclerotic heart disease of native coronary artery without angina pectoris: Secondary | ICD-10-CM | POA: Diagnosis not present

## 2021-02-15 DIAGNOSIS — I25118 Atherosclerotic heart disease of native coronary artery with other forms of angina pectoris: Secondary | ICD-10-CM

## 2021-02-15 DIAGNOSIS — Z951 Presence of aortocoronary bypass graft: Secondary | ICD-10-CM | POA: Diagnosis not present

## 2021-02-15 DIAGNOSIS — E1165 Type 2 diabetes mellitus with hyperglycemia: Secondary | ICD-10-CM | POA: Insufficient documentation

## 2021-02-15 DIAGNOSIS — E785 Hyperlipidemia, unspecified: Secondary | ICD-10-CM | POA: Insufficient documentation

## 2021-02-15 DIAGNOSIS — R0609 Other forms of dyspnea: Secondary | ICD-10-CM

## 2021-02-15 DIAGNOSIS — I110 Hypertensive heart disease with heart failure: Secondary | ICD-10-CM | POA: Insufficient documentation

## 2021-02-15 DIAGNOSIS — I779 Disorder of arteries and arterioles, unspecified: Secondary | ICD-10-CM | POA: Diagnosis not present

## 2021-02-15 DIAGNOSIS — F319 Bipolar disorder, unspecified: Secondary | ICD-10-CM | POA: Diagnosis not present

## 2021-02-15 DIAGNOSIS — R0600 Dyspnea, unspecified: Secondary | ICD-10-CM

## 2021-02-15 LAB — COMPREHENSIVE METABOLIC PANEL
ALT: 33 U/L (ref 0–44)
AST: 32 U/L (ref 15–41)
Albumin: 4.2 g/dL (ref 3.5–5.0)
Alkaline Phosphatase: 76 U/L (ref 38–126)
Anion gap: 8 (ref 5–15)
BUN: 10 mg/dL (ref 8–23)
CO2: 25 mmol/L (ref 22–32)
Calcium: 9.2 mg/dL (ref 8.9–10.3)
Chloride: 102 mmol/L (ref 98–111)
Creatinine, Ser: 0.81 mg/dL (ref 0.61–1.24)
GFR, Estimated: >60 mL/min (ref >60)
Glucose, Bld: 234 mg/dL — ABNORMAL HIGH (ref 70–99)
Potassium: 4.3 mmol/L (ref 3.5–5.1)
Sodium: 135 mmol/L (ref 135–145)
Total Bilirubin: 0.8 mg/dL (ref 0.3–1.2)
Total Protein: 7.2 g/dL (ref 6.5–8.1)

## 2021-02-15 LAB — LDL CHOLESTEROL, DIRECT: Direct LDL: 60.1 mg/dL (ref 0–99)

## 2021-02-15 LAB — LIPID PANEL
Cholesterol: 115 mg/dL (ref 0–200)
HDL: 27 mg/dL — ABNORMAL LOW (ref >40)
LDL Cholesterol: 51 mg/dL (ref 0–99)
Total CHOL/HDL Ratio: 4.3 RATIO
Triglycerides: 185 mg/dL — ABNORMAL HIGH (ref <150)
VLDL: 37 mg/dL (ref 0–40)

## 2021-02-15 MED ORDER — TORSEMIDE 10 MG PO TABS: 10.0000 mg | ORAL_TABLET | Freq: Every day | ORAL | 0 refills | Status: DC

## 2021-02-15 NOTE — Progress Notes (Signed)
Office Visit    Patient Name: Scott Doyle Date of Encounter: 02/16/2021  PCP:  Cletis [place], [place]  Cardiologist:  Kathlyn Sacramento, MD  Advanced Practice Provider:  No care team member to display Electrophysiologist:  None [phone]   Chief Complaint    Chief Complaint  Patient presents with   Follow-up    60 y.o. male with history of CAD s/p CABG x1, hypertension, hyperlipidemia, and DM 2, and who presents for follow-up after recent start of diuresis due to increased volume status with ReDS vest 37%  39%.  Past Medical History    Past Medical History:  Diagnosis Date   Anxiety    Coronary artery disease    a. s/p prior CABG x 1 (LIMA->LAD) and PCI to the D2; b. 01/2012 PCI: patent prox Diag stent w/ sev distal dzs->DES x 1; c. 08/2014 Cath: LM nl, LAD 22m, D2 patent stents w/ 32m, LCX nl, RCA 40p, LIMA->LAD nl, EF 60%.   Diabetes mellitus    Hyperlipidemia    Hypertension    Thrombocytopathia [facility])    Past Surgical History:  Procedure Laterality Date   CARDIAC CATHETERIZATION     CARDIAC CATHETERIZATION  08/2014   Patent LIMA to LAD and patent diagonal stents.   CORONARY ARTERY BYPASS GRAFT  2001   [facility]    Allergies  Allergies  Allergen Reactions   Ampicillin Hives   Penicillins Hives and Swelling    History of Present Illness    Scott Doyle is a 60 y.o. male with PMH as above and including CAD's s/p remote CABG x1 with placement of the LIMA to the LAD.  He reports feeling back then that he started having sx at work with CP that increased to the point of needing to go to the ED. He also had burning between his shoulder pain. He quit smoking then. He subsequently underwent PCI to the second diagonal with repeat PCI in May 2013 secondary to more distal disease requiring drug-eluting stent placement.  His last cardiac catheterization was in December 2016, revealing patent diagonal stents.  He has since undergone treadmill  testing in 2018, which was reportedly normal.  Other history includes hypertension, hyperlipidemia, anxiety, and DM2.  He was seen 05/2018, at which time he reported he was doing well from a cardiac standpoint.  He was fairly active in the yard without any exertional chest pain or pressure but sometimes did note chest wall soreness.  He thought this was different from prior angina and more likely musculoskeletal in etiology, as there was some tenderness as well.  He reported that it did not typically limit his activities.   He was last seen in clinic 09/20/2019.  He noted left upper quadrant pain, described as different from previous chest wall soreness.  No clear exacerbating or alleviating factors.  He had chest wall soreness, attributed to lifting his grandson and the baby carrier/car seat.  He had occasional dizziness with plans to follow-up with his PCP for carotid studies.  He had occasional left ear tinnitus going to cut back salt, alcohol, and sugar.  He was excited to be taking care of his new his grandchild.   Seen 12/11/2020 and reported symptoms of dizziness, thought consistent with his orthostasis and occurring mainly with position changes.  He was not drinking much water throughout the day and did report drinking diet Pepsi and coffee.  He reported nightly wine.  Hydration encouraged, given report  of amber urine at times.  He noted left upper quadrant pain, described as sharp cramping and occasionally associated with racing heart rate.  He stated he mainly noted the left upper quadrant pain at rest.  At times, he felt as if his heart was beating hard.  He reported chest tightness that was not constant but rather came and went without clear triggers.  Chest discomfort/tightness improved with walking around/exertion.  He reported pain in the center of his back and between his shoulder blades.  At times, it felt as if someone was pressing between his shoulder blades.  He reported dyspnea on exertion while  walking the dog, which was concerning to him.  No shortness of breath at rest.     Recommendations were for echo and MPI, based on his symptoms above.  Social work contacted for BP cuff and with patient reporting successful receipt of BP cuff today.  Future considerations noted included increased quinapril versus Imdur/Farxiga.   12/15/2020 MPI was without significant ischemia or scar and ruled in overall low risk study.  Attenuation correction CT showed post CABG findings and coronary artery calcification.  Risk factor modification recommended.   01/06/2021 echo showed EF 60 to 65%, NR WMA, G2 DD, mild LAE.  We reviewed that his EF improved from previous report of 45% with patient understanding.  BP control recommended.   Seen 01/11/2021 with resolution of previous chest pain.  He continued to report sporadic pain between his shoulder blades without clear triggers.  He had no further tachypalpitations.  No further left upper quadrant pain/cramping reported.  No further orthostasis.  He noted occasional amber urine with drinking wine.  He was spending time with his grandson, 82 years old.  He would occasionally add salt to his food.  Reds vest 37%.  He was uncertain if he would still have dyspnea with walking the dog.  He had received the BP cuff through social work with SBP 160s to 180s and DBP 80s to 90s.  He noted an increase in weight.  Recommendation was for initiation of torsemide 10 mg x 3 days then sliding scale thereafter.  Addition of SGLT2 inhibitor was noted to be considered at RTC.   Today, 02/15/2021, he returns to clinic and notes that he has been doing well since starting his diuretic at his previous clinic visit due to increased volume status and Reds vest of 37%.  He initially took 3 days of torsemide 10 mg daily with report of increased urine output and improved BP, during which time he felt significantly improved and more energetic than usual.  With initiation of torsemide, he has noticed that  he urinates more frequently and has longer streams than in the past.   After those 3 days of daily torsemide 10 mg, he was transitioned to a sliding scale regimen for weight gain 3 pounds overnight or 5 pounds in 1 week.  He reports taking torsemide for weight above 170 pounds.  He has not taken his torsemide for the past few days, given his weight has been 170 pounds or less.  He did not take torsemide today.  BP today 160/80 with home BP SBP 138.  Home SBP reported as 111-180s.  At times, he feels puffy at night.  He denies any lower extremity edema.  Ongoing abdominal distention noted on exam with patient denying any significant bloating.  No PND, orthopnea, or early satiety reported.  He continues to weigh himself on a daily basis, usually at the time  that his grandson also weighs himself.  He usually wakes and weighs himself, as well as takes his blood pressure.  We sent him a blood pressure cuff, so he is confident of its accuracy.  BP measurement method reviewed with patient reporting an accurate method of measuring his blood pressure at home.  Suspect that the blood pressure measurements may be due to labile pressure that varies throughout the day with salt intake and fluid status.  Reviewed recommendation for total daily intake under 2 L/day and salt intake under 2 g/day.  He is going to get the salt supplement, as he forgot to get it after his last clinic visit.  He is working to cut back on wine and down to 1 glass per day. He reports improved but ongoing symptoms of occasional orthostasis, as well as dizziness with fast head/upper body position changes.  He continues to report floaters with discussion again regarding updating carotids at future visits or between visits with patient understanding.  We will recheck lipids and recommend consider updating carotids at that time if LDL not well controlled and ongoing sx, given he reports dizziness with head motion. No bruit on exam today.  He denies any  recurrent chest pain but does report ongoing discomfort between his shoulder blades as noted in the past.  He reports positional neck pain, improving with sitting up straight.  He reports a history of intermittent left upper quadrant pain, and while resolved since the above medication changes have been made, he does note concern regarding the symptoms.  He denies any shortness of breath at rest.  He has not walked his dog very far so is unable to state if dyspnea, though he has not noted it when playing with his grandson.  No signs or symptoms of bleeding.  Home Medications   Current Outpatient Medications  Medication Instructions   ACCU-CHEK AVIVA PLUS test strip USE ONE STRIP DAILY AS DIRECTED   aspirin 81 mg, Daily   atenolol (TENORMIN) 25 mg, Oral, Daily   atorvastatin (LIPITOR) 40 MG tablet TAKE 1 TABLET BY MOUTH EVERY DAY   cholecalciferol (VITAMIN D) 1,000 Units, Oral, Daily   ezetimibe (ZETIA) 10 mg, Oral, Daily   glipiZIDE (GLUCOTROL XL) 2.5 mg, Oral, Daily   Insulin Pen Needle (PEN NEEDLES) 33G X 4 MM MISC Use to administer insulin daily   isosorbide mononitrate (IMDUR) 120 mg, Oral, Daily   LANTUS SOLOSTAR 100 UNIT/ML Solostar Pen INJECT 40 UNITS AS DIRECTED   metFORMIN (GLUCOPHAGE) 1000 MG tablet TAKE 1 TABLET BY MOUTH TWICE A DAY   nitroGLYCERIN (NITROSTAT) 0.4 mg, Sublingual, Every 5 min PRN, Maximum of 3 doses.   omeprazole (PRILOSEC) 20 mg, Oral, Daily   quinapril (ACCUPRIL) 40 MG tablet TAKE 1 TABLET BY MOUTH EVERY DAY   torsemide (DEMADEX) 10 mg, Oral, Daily   traZODone (DESYREL) 50 MG tablet TAKE 1/2 TO 1 TABLET BY MOUTH AT BEDTIME AS NEEDED FOR SLEEP.     Review of Systems    He reports resolution of previously reported racing HR, pounding heartbeat. He is uncertain if still experiencing dyspnea, as he does not walk the dog very far and is relatively sedentary.  He reports resolution of previous chest tightness at rest that improved with ambulation. He has positional  neck pain, improved with sitting up straight, and thought due to MSK etiology. He continues to experience pressure between his shoulder blades without clear triggers.  History of intermittent/brief/sharp left upper quadrant pain, and though resolved, he notes  concern regarding this sx.  Reports ongoing orthostasis with slow position changes or fast turning of the head and floaters.  He denies pnd, orthopnea, n, v, syncope, edema, weight gain, or early satiety. He reports feeling puffy at times all over at night or in his face. He reports hand pain, attributed to arthritis.   All other systems reviewed and are otherwise negative except as noted above.  Physical Exam    VS:  BP (!) 160/80 (BP Location: Left Arm, Patient Position: Sitting, Cuff Size: Normal)   Pulse 62   Resp 19   Ht 5\' 7"  (1.702 m)   Wt 169 lb 6 oz (76.8 kg)   SpO2 98%   BMI 26.53 kg/m  , BMI Body mass index is 26.53 kg/m. GEN: Well nourished, well developed, in no acute distress. Mask in place. HEENT: normal. Neck: Supple, JVP approximately 9 cm.  No carotid bruits or masses. Cardiac: Regular rate and rhythm, 1/6 systolic murmur.  No rubs or gallops. No clubbing, cyanosis, or pitting edema.  Radials/DP/PT 2+ and equal bilaterally. Respiratory:  Respirations regular and unlabored, CTAB. GI: Firm, distended, BS + x 4. MS: no deformity or atrophy. Skin: warm and dry, no rash. Neuro:  Strength and sensation are intact. Psych: Normal affect.  Accessory Clinical Findings    ECG personally reviewed by me today - no EKG  VITALS Reviewed today   Temp Readings from Last 3 Encounters:  05/15/20 (!) 96.4 F (35.8 C)  12/31/17 98.1 F (36.7 C) (Oral)   BP Readings from Last 3 Encounters:  02/15/21 (!) 160/80  01/11/21 (!) 148/84  12/11/20 130/68   Pulse Readings from Last 3 Encounters:  02/15/21 62  01/11/21 62  12/11/20 (!) 54    Wt Readings from Last 3 Encounters:  02/15/21 169 lb 6 oz (76.8 kg)  01/11/21 172 lb  (78 kg)  12/11/20 169 lb (76.7 kg)     LABS  reviewed today   Lab Results  Component Value Date   WBC 4.9 01/05/2018   HGB 15.2 01/05/2018   HCT 44.3 01/05/2018   MCV 86.7 01/05/2018   PLT 135 (L) 01/05/2018   Lab Results  Component Value Date   CREATININE 0.81 02/15/2021   BUN 10 02/15/2021   NA 135 02/15/2021   K 4.3 02/15/2021   CL 102 02/15/2021   CO2 25 02/15/2021   Lab Results  Component Value Date   ALT 33 02/15/2021   AST 32 02/15/2021   ALKPHOS 76 02/15/2021   BILITOT 0.8 02/15/2021   Lab Results  Component Value Date   CHOL 115 02/15/2021   HDL 27 (L) 02/15/2021   LDLCALC 51 02/15/2021   LDLDIRECT 60.1 02/15/2021   TRIG 185 (H) 02/15/2021   CHOLHDL 4.3 02/15/2021    Lab Results  Component Value Date   HGBA1C 9.1 (H) 09/14/2016   Lab Results  Component Value Date   TSH 0.958 09/14/2016     STUDIES/PROCEDURES reviewed today   Echo 01/2021  1. Left ventricular ejection fraction, by estimation, is 60 to 65%. The  left ventricle has normal function. The left ventricle has no regional  wall motion abnormalities. Left ventricular diastolic parameters are  consistent with Grade II diastolic  dysfunction (pseudonormalization).   2. Right ventricular systolic function is normal. The right ventricular  size is normal.   3. Left atrial size was mildly dilated.   4. The mitral valve is normal in structure. No evidence of mitral valve  regurgitation.  5. The aortic valve is tricuspid. Aortic valve regurgitation is not  visualized. Mild aortic valve sclerosis is present, with no evidence of  aortic valve stenosis.  Comparison(s): Previous LVEF reported as 45%.   MPI Normal pharmacologic myocardial perfusion stress test without significant ischemia or scar. The left ventricular ejection fraction is normal (LVEF 53% by QGS, 74% by Siemens). Attenuation correction CT is notable for post CABG findings and coronary artery calcification. This is a low risk  study.    Assessment & Plan    Acute on chronic HFpEF/G2DD (EF 60 to 65% 01/2021) -- Reports improved sx / volume status with wt decreased from previous clinic wt by 3lbs, though ReDS went from  37%  39%. Wt loss noted from previous clinic visit, though ongoing elevated BP and elevated ReDS noted. Discussed Na/fluid intake. He is taking torsemide 10mg  on days his wt is greater than 170lbs. Suspect his dry wt may be less than 170lbs on his home scale. Recent echo as above with EF 60 to 65%, NR WMA, G2 DD.  Increase to daily torsemide 10mg  with further recommendations if needed regarding diuretic +/- Kcl tab If needed pending labs. Will check BMET today and at RTC.  Reviewed recommendations for fluid and salt intake with patient understanding.  Glycemic control also recommended. Recommend ongoing home BP and weight monitoring.     CAD s/p CABG x1 and diagonal stenting x2 --No current CP with report of ongoing atypical back / upper epigastric / neck sx noted in the past with workup unrevealing as above.  Most recent echo as above with EF normal and NR WMA.  MPI ruled low risk.  Most recent cath 2016 as above.  Escalation of current beta-blocker precluded by lower HR today with h/o bradycardic rates.  We will continue to defer.  Aggressive risk factor modification recommended.  Recommend increase activity as tolerated and ongoing heart healthy diet changes.  Continue current ASA, ACE, BB, nitrates, and statin.  Imdur prescription consolidated at previous clinic visit.   Essential Hypertension, goal < 130/80 --BP elevated in the setting of ongoing elevated volume status.  Plan for increase of torsemide 10 mg from PRN to daily torsemide.  Suspect improvement of BP s/p diuresis. Activity/daily exercise discussed. Escalation of atenolol precluded by history of bradycardic rates.  Continue increased dose Imdur, quinapril, atenolol.  He has received his BP cuff from social work and will continue to monitor his BP  at home, as well as daily weights.  BP reading at home was not similar to that at clinic and may be due to labile pressures in the setting of changing volume status. Reviewed salt and fluid restrictions.  If home BP remains consistently elevated over 130/80, he will call the office, at which time further antihypertensive medications should be adjusted to achieve goal pressures.     Orthostasis symptoms --Reports resolution of previous dizziness with position changes.  Continue to recommend slow position changes and reduce caffeine/wine intake.  Recommend hydration, especially with initiation of diuresis; however, with total daily fluid intake under 2 L/day and salt intake under 2 g/day.  He wonders if his symptoms are 2/2 vision changes with patient report that he will go to the eye doctor soon.  He denies any amaurosis fugax or signs of worsening carotid disease.  Defer repeat carotids.  Reconsider at RTC. Echo obtained as above with EF WNL and NR WMA.  MPI ruled low risk.   History of racing heart rate H/o sinus bradycardia --  Not bradycardic or tachycardic today. As above, escalation of beta-blocker precluded by history of sinus bradycardia.   HLD, LDL goal <70 --Continue Crestor.  He is tolerating Zetia 10 mg daily well, which was added between visits due to LDL above goal.  Recheck LDL/ALT to ensure at goal.  Mild bilateral carotid dz --Mild b/l carotid dz by 2014 carotids with recommendation for repeat studies in 2 years, not yet performed. Reports dizziness with sudden head movements and position changes, as well as floaters. Diabetic eye exams are performed and without retinopathy. Given elevated A1C and previous LDL, consider updating studies to confirm no progression of carotid dz with his report of dizziness and floaters. Reassess at RTC if not done before that time. Continue ASA and statin.    DM2, poorly controlled --Per PCP. Previous A1C 9.1 and poorly controlled.  Glycemic control  recommended for aggressive risk factor modification.  Continue quinapril given comorbid hypertension and diabetes.  Could consider [place] or [facility] at [facility].  Will defer for now.       Medication changes: Increase to torsemide 10 mg daily (likely will need to add K supplement, pending labs) Labs ordered: CMET, lipids Studies / Imaging ordered: None. If LDL still elevated, consider carotids between visits as discussed today Future considerations: Carotids Disposition: RTC 1 month   *Please be aware that the above documentation was completed voice recognition software and may contain dictation errors.     Arvil Chaco, PA-C [facility]

## 2021-02-15 NOTE — Patient Instructions (Addendum)
Don't forget to bring your salt substitute.   Medication Instructions:  Your physician has recommended you make the following change in your medication:   TAKE Torsemide 10 mg once daily.   *If you need a refill on your cardiac medications before your next appointment, please call your pharmacy*   Lab Work: BMET, CMET, Lipid, and Direct LDL today  If you have labs (blood work) drawn today and your tests are completely normal, you will receive your results only by: [place] (if you have MyChart) OR A paper copy in the mail If you have any lab test that is abnormal or we need to change your treatment, we will call you to review the results.   Testing/Procedures: None   Follow-Up: At [facility], you and your health needs are our priority.  As part of our continuing mission to provide you with exceptional heart care, we have created designated Provider Care Teams.  These Care Teams include your primary Cardiologist (physician) and Advanced Practice Providers (APPs -  Physician Assistants and Nurse Practitioners) who all work together to provide you with the care you need, when you need it.   Your next appointment:   3-4 month(s)  The format for your next appointment:   In Person  Provider:   Kathlyn Sacramento, MD or Marrianne Mood, PA-C

## 2021-02-19 ENCOUNTER — Telehealth: Payer: Self-pay | Admitting: *Deleted

## 2021-02-19 DIAGNOSIS — I5189 Other ill-defined heart diseases: Secondary | ICD-10-CM

## 2021-02-19 DIAGNOSIS — R0609 Other forms of dyspnea: Secondary | ICD-10-CM

## 2021-02-19 DIAGNOSIS — I25118 Atherosclerotic heart disease of native coronary artery with other forms of angina pectoris: Secondary | ICD-10-CM

## 2021-02-19 NOTE — Telephone Encounter (Signed)
Reviewed results and recommendations with patient. Scheduled him with follow up in one month per provider request. Will also enter order for repeat labs to be done on July 8 th prior to his appointment on July 14 th at 4:20 pm with Dr. Fletcher Anon. Will send My Chart message to patient with this information. Patient verbalized understanding of our conversation, agreement with plan, and had no further questions at this time.

## 2021-02-19 NOTE — Telephone Encounter (Signed)
-----   Message from Arvil Chaco, PA-C sent at 02/17/2021 10:26 PM EDT ----- Labs show --LDL at goal of below 70 and currently 60.1. --Triglycerides improved from previous labs but still elevated. At his next visit, we will need to discuss addition of Vascepa for further Tg control.  --Stable renal function. Potassium currently at goal. We will plan to recheck renal function at his upcoming visit given we increased his torsemide from PRN to daily at his clinic visit this week.  Recommendations: --Recheck kidney function at upcoming visit. On review of his upcoming visits, this appointment is actually with Dr. Fletcher Anon, so we may want to go ahead and place this lab to be collected before his visit then to be certain we have it by the time of his visit.  --Discuss Tg at upcoming visit. Since his upcoming visit is with Dr. Fletcher Anon, we can start Vascepa 2g BID before that time if he wishes to do so before his visit. This will help to get his Tg down very quickly.  --Let us know how he tolerates daily torsemide moving forward.

## 2021-02-19 NOTE — Telephone Encounter (Signed)
-----   Message from Arvil Chaco, PA-C sent at 02/17/2021 10:28 PM EDT ----- Please make sure he has a repeat BMET in 2-4 weeks. His upcoming visit was made further out than recommended during our visit (we had needed him back in 1 month to reassess volume status), and we will need his repeat labs before that time. Given his elevated ReDS vest at 39%, he should be seen back in 2-4 weeks. Do we not have a slot open for him before that time?

## 2021-03-12 ENCOUNTER — Other Ambulatory Visit
Admission: RE | Admit: 2021-03-12 | Discharge: 2021-03-12 | Disposition: A | Discharge status: Home / Self Care | Payer: Medicare HMO | Source: Ambulatory Visit | Attending: Physician Assistant | Admitting: Physician Assistant

## 2021-03-12 DIAGNOSIS — R0600 Dyspnea, unspecified: Secondary | ICD-10-CM | POA: Diagnosis not present

## 2021-03-12 DIAGNOSIS — I5189 Other ill-defined heart diseases: Secondary | ICD-10-CM | POA: Diagnosis not present

## 2021-03-12 DIAGNOSIS — I25118 Atherosclerotic heart disease of native coronary artery with other forms of angina pectoris: Secondary | ICD-10-CM | POA: Insufficient documentation

## 2021-03-12 DIAGNOSIS — R0609 Other forms of dyspnea: Secondary | ICD-10-CM

## 2021-03-12 LAB — BASIC METABOLIC PANEL
Anion gap: 8 (ref 5–15)
BUN: 17 mg/dL (ref 8–23)
CO2: 25 mmol/L (ref 22–32)
Calcium: 8.9 mg/dL (ref 8.9–10.3)
Chloride: 101 mmol/L (ref 98–111)
Creatinine, Ser: 0.9 mg/dL (ref 0.61–1.24)
GFR, Estimated: >60 mL/min (ref >60)
Glucose, Bld: 326 mg/dL — ABNORMAL HIGH (ref 70–99)
Potassium: 4.7 mmol/L (ref 3.5–5.1)
Sodium: 134 mmol/L — ABNORMAL LOW (ref 135–145)

## 2021-03-18 ENCOUNTER — Ambulatory Visit (INDEPENDENT_AMBULATORY_CARE_PROVIDER_SITE_OTHER): Payer: Medicare HMO | Admitting: Cardiovascular Disease

## 2021-03-18 ENCOUNTER — Other Ambulatory Visit: Payer: Self-pay

## 2021-03-18 ENCOUNTER — Encounter: Payer: Self-pay | Admitting: Cardiovascular Disease

## 2021-03-18 VITALS — BP 138/64 | HR 61 | Ht 67.0 in | Wt 167.0 lb

## 2021-03-18 DIAGNOSIS — I2510 Atherosclerotic heart disease of native coronary artery without angina pectoris: Secondary | ICD-10-CM | POA: Diagnosis not present

## 2021-03-18 DIAGNOSIS — E785 Hyperlipidemia, unspecified: Secondary | ICD-10-CM

## 2021-03-18 DIAGNOSIS — E119 Type 2 diabetes mellitus without complications: Secondary | ICD-10-CM

## 2021-03-18 DIAGNOSIS — I5032 Chronic diastolic (congestive) heart failure: Secondary | ICD-10-CM | POA: Diagnosis not present

## 2021-03-18 DIAGNOSIS — I10 Essential (primary) hypertension: Secondary | ICD-10-CM

## 2021-03-18 NOTE — Patient Instructions (Signed)
Medication Instructions:  Your physician recommends that you continue on your current medications as directed. Please refer to the Current Medication list given to you today.  *If you need a refill on your cardiac medications before your next appointment, please call your pharmacy*   Lab Work: None ordered If you have labs (blood work) drawn today and your tests are completely normal, you will receive your results only by: MyChart Message (if you have MyChart) OR A paper copy in the mail If you have any lab test that is abnormal or we need to change your treatment, we will call you to review the results.   Testing/Procedures: None ordered   Follow-Up: At [facility], you and your health needs are our priority.  As part of our continuing mission to provide you with exceptional heart care, we have created designated Provider Care Teams.  These Care Teams include your primary Cardiologist (physician) and Advanced Practice Providers (APPs -  Physician Assistants and Nurse Practitioners) who all work together to provide you with the care you need, when you need it.  We recommend signing up for the patient portal called "MyChart".  Sign up information is provided on this After Visit Summary.  MyChart is used to connect with patients for Virtual Visits (Telemedicine).  Patients are able to view lab/test results, encounter notes, upcoming appointments, etc.  Non-urgent messages can be sent to your provider as well.   To learn more about what you can do with MyChart, go to [email or web address].    Your next appointment:   Your physician wants you to follow-up in: 6 months You will receive a reminder letter in the mail two months in advance. If you don't receive a letter, please call our office to schedule the follow-up appointment.   The format for your next appointment:   In Person  Provider:   You may see Muhammad Arida, MD or one of the following Advanced Practice Providers on your  designated Care Team:   Christopher Berge, NP Ryan Dunn, PA-C Jacquelyn Visser, PA-C Cadence Furth, PA-C   Other Instructions N/A  

## 2021-03-18 NOTE — Progress Notes (Signed)
Cardiology Office Note   Date:  03/18/2021   ID:  Scott Doyle, DOB May 07, 1961, MRN [id number]  PCP:  Cletis Athens, MD  Cardiologist:   Kathlyn Sacramento, MD   Chief Complaint  Patient presents with   Other    Per f/u Appointment 6/13 no complaints today. Meds reviewed verbally with pt.      History of Present Illness: Scott Doyle is a 60 y.o. male who presents for  followup visit regarding coronary artery disease. He is status post CABG and PCI of diagonal. He has other medical problems that include hypertension, hyperlipidemia and type 2 diabetes. Most recent cardiac catheterization in December 2015 showed patent LIMA to LAD and patent stents in the diagonal.  There was mild proximal RCA stenosis and overall no evidence of obstructive coronary artery disease. He has been seen 3 times since April for atypical chest pain and exertional dyspnea.  He underwent a Lexiscan Myoview in April which showed no evidence of ischemia with normal ejection fraction.  Echocardiogram in May showed an EF of 60% with grade 2 diastolic dysfunction, mildly dilated left atrium and no significant valvular abnormalities.  The patient was started on small dose torsemide. He reports stable symptoms of chest pain which she had for years.  He reports improvement in shortness of breath with torsemide.  His weight also went down.    Past Medical History:  Diagnosis Date   Anxiety    Coronary artery disease    a. s/p prior CABG x 1 (LIMA->LAD) and PCI to the D2; b. 01/2012 PCI: patent prox Diag stent w/ sev distal dzs->DES x 1; c. 08/2014 Cath: LM nl, LAD 9m, D2 patent stents w/ 6m, LCX nl, RCA 40p, LIMA->LAD nl, EF 60%.   Diabetes mellitus    Hyperlipidemia    Hypertension    Thrombocytopathia [facility]     Past Surgical History:  Procedure Laterality Date   CARDIAC CATHETERIZATION     CARDIAC CATHETERIZATION  08/2014   Patent LIMA to LAD and patent diagonal stents.   CORONARY ARTERY BYPASS GRAFT   2001   [facility]     Current Outpatient Medications  Medication Sig Dispense Refill   ACCU-CHEK AVIVA PLUS test strip USE ONE STRIP DAILY AS DIRECTED 100 strip 4   aspirin 81 MG tablet Take 81 mg by mouth daily.     atenolol (TENORMIN) 25 MG tablet Take 1 tablet (25 mg total) by mouth daily. 90 tablet 3   atorvastatin (LIPITOR) 40 MG tablet TAKE 1 TABLET BY MOUTH EVERY DAY 90 tablet 4   cholecalciferol (VITAMIN D) 1000 units tablet Take 1,000 Units by mouth daily.  4   ezetimibe (ZETIA) 10 MG tablet Take 1 tablet (10 mg total) by mouth daily. 90 tablet 3   glipiZIDE (GLUCOTROL XL) 2.5 MG 24 hr tablet Take 1 tablet (2.5 mg total) by mouth daily. 90 tablet 3   Insulin Pen Needle (PEN NEEDLES) 33G X 4 MM MISC Use to administer insulin daily 30 each 6   isosorbide mononitrate (IMDUR) 120 MG 24 hr tablet Take 1 tablet (120 mg total) by mouth daily. 90 tablet 3   LANTUS SOLOSTAR 100 UNIT/ML Solostar Pen INJECT 40 UNITS AS DIRECTED 15 mL 3   metFORMIN (GLUCOPHAGE) 1000 MG tablet TAKE 1 TABLET BY MOUTH TWICE A DAY 180 tablet 4   nitroGLYCERIN (NITROSTAT) 0.4 MG SL tablet Place 1 tablet (0.4 mg total) under the tongue every 5 (five) minutes as needed. Maximum of  3 doses. 25 tablet 3   omeprazole (PRILOSEC) 20 MG capsule Take 1 capsule (20 mg total) by mouth daily. 90 capsule 3   quinapril (ACCUPRIL) 40 MG tablet TAKE 1 TABLET BY MOUTH EVERY DAY 90 tablet 5   torsemide (DEMADEX) 10 MG tablet Take 1 tablet (10 mg total) by mouth daily. 90 tablet 0   traZODone (DESYREL) 50 MG tablet TAKE 1/2 TO 1 TABLET BY MOUTH AT BEDTIME AS NEEDED FOR SLEEP. 90 tablet 2   No current facility-administered medications for this visit.    Allergies:   Ampicillin and Penicillins    Social History:  The patient  reports that he has quit smoking. He has never used smokeless tobacco. He reports current alcohol use. He reports that he does not use drugs.   Family History:  The patient's family history includes Cancer in an  other family member; Diabetes in an other family member; Heart disease in his mother; Hypertension in his mother.    ROS:  Please see the history of present illness.   Otherwise, review of systems are positive for none.   All other systems are reviewed and negative.    PHYSICAL EXAM: VS:  BP 138/64 (BP Location: Left Arm, Patient Position: Sitting, Cuff Size: Normal)   Pulse 61   Ht 5\' 7"  (1.702 m)   Wt 167 lb (75.8 kg)   SpO2 98%   BMI 26.16 kg/m  , BMI Body mass index is 26.16 kg/m. GEN: Well nourished, well developed, in no acute distress  HEENT: normal  Neck: no JVD, carotid bruits, or masses Cardiac: RRR; no rubs, or gallops,no edema . There is 1/ 6 systolic ejection murmur in the aortic area Respiratory:  clear to auscultation bilaterally, normal work of breathing GI: soft, nontender, nondistended, + BS MS: no deformity or atrophy  Skin: warm and dry, no rash Neuro:  Strength and sensation are intact Psych: euthymic mood, full affect   EKG:  EKG is ordered today. The ekg ordered today demonstrates normal sinus rhythm with no significant ST or T wave changes.  Recent Labs: 02/15/2021: ALT 33 03/12/2021: BUN 17; Creatinine, Ser 0.90; Potassium 4.7; Sodium 134    Lipid Panel    Component Value Date/Time   CHOL 115 02/15/2021 1717   CHOL 95 11/25/2011 1201   TRIG 185 (H) 02/15/2021 1717   TRIG 76 11/25/2011 1201   HDL 27 (L) 02/15/2021 1717   HDL 29 (L) 11/25/2011 1201   CHOLHDL 4.3 02/15/2021 1717   VLDL 37 02/15/2021 1717   VLDL 15 11/25/2011 1201   LDLCALC 51 02/15/2021 1717   LDLCALC 51 11/25/2011 1201   LDLDIRECT 60.1 02/15/2021 1717      Wt Readings from Last 3 Encounters:  03/18/21 167 lb (75.8 kg)  02/15/21 169 lb 6 oz (76.8 kg)  01/11/21 172 lb (78 kg)       ASSESSMENT AND PLAN:  1.  Coronary artery disease involving native coronary arteries with other forms of angina: The patient has prolonged history of atypical angina.  He seems to be stable  on beta-blocker and long-acting nitroglycerin.  Recent Lexiscan Myoview was reviewed with him showed no evidence of ischemia.    2.  Chronic diastolic heart failure: It appears that he had some volume overload recently but he seems to be euvolemic on small dose torsemide.  I reviewed his labs from last week which showed normal renal function and potassium.  He had questions about the recent echocardiogram and these were  answered today.  3.  Essential hypertension: Blood pressure is reasonably controlled on current medications I asked him to continue the same medication.  3. Hyperlipidemia: I reviewed his most recent lipid profile done in June which showed an LDL of 60 and triglyceride of 76.  Based on this, I advised him to continue atorvastatin and Zetia.  4. Type 2 diabetes: Still not optimally controlled.  Consider adding Jardiance to his medications given his cardiac history.   Disposition:   FU with me in 6 months.  Signed,  Kathlyn Sacramento, MD  03/18/2021 4:30 PM    [place]

## 2021-04-13 ENCOUNTER — Ambulatory Visit (INDEPENDENT_AMBULATORY_CARE_PROVIDER_SITE_OTHER): Payer: Medicare HMO | Admitting: Internal Medicine

## 2021-04-13 ENCOUNTER — Encounter: Payer: Self-pay | Admitting: Internal Medicine

## 2021-04-13 ENCOUNTER — Other Ambulatory Visit: Payer: Self-pay

## 2021-04-13 VITALS — BP 165/82 | HR 59 | Ht 67.0 in | Wt 161.6 lb

## 2021-04-13 DIAGNOSIS — J329 Chronic sinusitis, unspecified: Secondary | ICD-10-CM | POA: Diagnosis not present

## 2021-04-13 DIAGNOSIS — J029 Acute pharyngitis, unspecified: Secondary | ICD-10-CM | POA: Diagnosis not present

## 2021-04-13 DIAGNOSIS — E139 Other specified diabetes mellitus without complications: Secondary | ICD-10-CM | POA: Diagnosis not present

## 2021-04-13 DIAGNOSIS — I10 Essential (primary) hypertension: Secondary | ICD-10-CM

## 2021-04-13 DIAGNOSIS — F419 Anxiety disorder, unspecified: Secondary | ICD-10-CM

## 2021-04-13 DIAGNOSIS — E782 Mixed hyperlipidemia: Secondary | ICD-10-CM

## 2021-04-13 LAB — POC COVID19 BINAXNOW: SARS Coronavirus 2 Ag: NEGATIVE

## 2021-04-13 LAB — POCT GLYCOSYLATED HEMOGLOBIN (HGB A1C): HbA1c POC (<> result, manual entry): 7.7 % (ref 4.0–5.6)

## 2021-04-13 LAB — GLUCOSE, POCT (MANUAL RESULT ENTRY): POC Glucose: 262 mg/dl — AB (ref 70–99)

## 2021-04-13 MED ORDER — AZITHROMYCIN 250 MG PO TABS: ORAL_TABLET | ORAL | 0 refills | Status: AC

## 2021-04-13 NOTE — Assessment & Plan Note (Signed)
Hypercholesterolemia  I advised the patient to follow Mediterranean diet This diet is rich in fruits vegetables and whole grain, and This diet is also rich in fish and lean meat Patient should also eat a handful of almonds or walnuts daily Recent heart study indicated that average follow-up on this kind of diet reduces the cardiovascular mortality by 50 to 70%== 

## 2021-04-13 NOTE — Assessment & Plan Note (Signed)
Patient was started on Z-Pak

## 2021-04-13 NOTE — Assessment & Plan Note (Signed)
-   Patient experiencing high levels of anxiety.  - Encouraged patient to engage in relaxing activities like yoga, meditation, journaling, going for a walk, or participating in a hobby.  - Encouraged patient to reach out to trusted friends or family members about recent struggles, Patient was advised to read A book, how to stop worrying and start living, it is good book to read to control  the stress  

## 2021-04-13 NOTE — Assessment & Plan Note (Signed)

## 2021-04-13 NOTE — Assessment & Plan Note (Signed)
COVID test is negative

## 2021-04-13 NOTE — Progress Notes (Signed)
Established Patient Office Visit  Subjective:  Patient ID: Scott Doyle, male    DOB: [birth year]  Age: 60 y.o. MRN: [id number]  CC:  Chief Complaint  Patient presents with   Sore Throat    Patient has had sore throat x 2 days. Symptoms started with cough and drainage.    Diabetes    Sore Throat   Diabetes   Scott Doyle presents for sorethroat  Past Medical History:  Diagnosis Date   Anxiety    Coronary artery disease    a. s/p prior CABG x 1 (LIMA->LAD) and PCI to the D2; b. 01/2012 PCI: patent prox Diag stent w/ sev distal dzs->DES x 1; c. 08/2014 Cath: LM nl, LAD 29m D2 patent stents w/ 380mLCX nl, RCA 40p, LIMA->LAD nl, EF 60%.   Diabetes mellitus    Hyperlipidemia    Hypertension    [facility]    Past Surgical History:  Procedure Laterality Date   CARDIAC CATHETERIZATION     CARDIAC CATHETERIZATION  08/2014   Patent LIMA to LAD and patent diagonal stents.   CORONARY ARTERY BYPASS GRAFT  2001   [facility]    Family History  Problem Relation Age of Onset   Hypertension Mother    Heart disease Mother    Diabetes Other        fx   Cancer Other        mat. family    Social History   Socioeconomic History   Marital status: Married    Spouse name: Not on file   Number of children: Not on file   Years of education: Not on file   Highest education level: Not on file  Occupational History   Not on file  Tobacco Use   Smoking status: Former   Smokeless tobacco: Never  Substance and Sexual Activity   Alcohol use: Yes   Drug use: No   Sexual activity: Not on file  Other Topics Concern   Not on file  Social History Narrative   Not on file   Social Determinants of Health   Financial Resource Strain: Not on file  Food Insecurity: Not on file  Transportation Needs: Not on file  Physical Activity: Not on file  Stress: Not on file  Social Connections: Not on file  Intimate Partner Violence: Not on file     Current Outpatient  Medications:    ACCU-CHEK AVIVA PLUS test strip, USE ONE STRIP DAILY AS DIRECTED, Disp: 100 strip, Rfl: 4   aspirin 81 MG tablet, Take 81 mg by mouth daily., Disp: , Rfl:    atenolol (TENORMIN) 25 MG tablet, Take 1 tablet (25 mg total) by mouth daily., Disp: 90 tablet, Rfl: 3   atorvastatin (LIPITOR) 40 MG tablet, TAKE 1 TABLET BY MOUTH EVERY DAY, Disp: 90 tablet, Rfl: 4   azithromycin (ZITHROMAX) 250 MG tablet, Take 2 tablets on day 1, then 1 tablet daily on days 2 through 5, Disp: 6 tablet, Rfl: 0   cholecalciferol (VITAMIN D) 1000 units tablet, Take 1,000 Units by mouth daily., Disp: , Rfl: 4   ezetimibe (ZETIA) 10 MG tablet, Take 1 tablet (10 mg total) by mouth daily., Disp: 90 tablet, Rfl: 3   glipiZIDE (GLUCOTROL XL) 2.5 MG 24 hr tablet, Take 1 tablet (2.5 mg total) by mouth daily., Disp: 90 tablet, Rfl: 3   Insulin Pen Needle (PEN NEEDLES) 33G X 4 MM MISC, Use to administer insulin daily, Disp: 30 each, Rfl: 6  isosorbide mononitrate (IMDUR) 120 MG 24 hr tablet, Take 1 tablet (120 mg total) by mouth daily., Disp: 90 tablet, Rfl: 3   LANTUS SOLOSTAR 100 UNIT/ML Solostar Pen, INJECT 40 UNITS AS DIRECTED, Disp: 15 mL, Rfl: 3   metFORMIN (GLUCOPHAGE) 1000 MG tablet, TAKE 1 TABLET BY MOUTH TWICE A DAY, Disp: 180 tablet, Rfl: 4   nitroGLYCERIN (NITROSTAT) 0.4 MG SL tablet, Place 1 tablet (0.4 mg total) under the tongue every 5 (five) minutes as needed. Maximum of 3 doses., Disp: 25 tablet, Rfl: 3   omeprazole (PRILOSEC) 20 MG capsule, Take 1 capsule (20 mg total) by mouth daily., Disp: 90 capsule, Rfl: 3   quinapril (ACCUPRIL) 40 MG tablet, TAKE 1 TABLET BY MOUTH EVERY DAY, Disp: 90 tablet, Rfl: 5   torsemide (DEMADEX) 10 MG tablet, Take 1 tablet (10 mg total) by mouth daily., Disp: 90 tablet, Rfl: 0   traZODone (DESYREL) 50 MG tablet, TAKE 1/2 TO 1 TABLET BY MOUTH AT BEDTIME AS NEEDED FOR SLEEP., Disp: 90 tablet, Rfl: 2   Allergies  Allergen Reactions   Ampicillin Hives   Penicillins Hives  and Swelling    ROS Review of Systems  Constitutional: Negative.   HENT: Negative.    Eyes: Negative.   Respiratory: Negative.    Cardiovascular: Negative.   Gastrointestinal: Negative.   Endocrine: Negative.   Genitourinary: Negative.   Musculoskeletal: Negative.   Skin: Negative.   Allergic/Immunologic: Negative.   Neurological: Negative.   Hematological: Negative.   Psychiatric/Behavioral: Negative.    All other systems reviewed and are negative.    Objective:    Physical Exam Vitals reviewed.  Constitutional:      Appearance: Normal appearance.  HENT:     Mouth/Throat:     Mouth: Mucous membranes are moist.  Eyes:     Pupils: Pupils are equal, round, and reactive to light.  Neck:     Vascular: No carotid bruit.  Cardiovascular:     Rate and Rhythm: Normal rate and regular rhythm.     Pulses: Normal pulses.     Heart sounds: Normal heart sounds.  Pulmonary:     Effort: Pulmonary effort is normal.     Breath sounds: Normal breath sounds.  Abdominal:     General: Bowel sounds are normal.     Palpations: Abdomen is soft. There is no hepatomegaly, splenomegaly or mass.     Tenderness: There is no abdominal tenderness.     Hernia: No hernia is present.  Musculoskeletal:     Cervical back: Neck supple.     Right lower leg: No edema.     Left lower leg: No edema.  Skin:    Findings: No rash.  Neurological:     Mental Status: He is alert and oriented to person, place, and time.     Motor: No weakness.  Psychiatric:        Mood and Affect: Mood normal.        Behavior: Behavior normal.    BP (!) 165/82   Pulse (!) 59   Ht '5\' 7"'$  (1.702 m)   Wt 161 lb 9.6 oz (73.3 kg)   BMI 25.31 kg/m  Wt Readings from Last 3 Encounters:  04/13/21 161 lb 9.6 oz (73.3 kg)  03/18/21 167 lb (75.8 kg)  02/15/21 169 lb 6 oz (76.8 kg)     Health Maintenance Due  Topic Date Due   FOOT EXAM  Never done   HIV Screening  Never done   Hepatitis  C Screening  Never done    Zoster Vaccines- Shingrix (1 of 2) Never done   COLONOSCOPY (Pts 45-35yr Insurance coverage will need to be confirmed)  Never done   Pneumococcal Vaccine [id number] old (2 - PCV) 01/06/2010   TETANUS/TDAP  06/05/2019   COVID-19 Vaccine (4 - Booster for Moderna series) 10/16/2020   INFLUENZA VACCINE  04/05/2021    There are no preventive care reminders to display for this patient.  Lab Results  Component Value Date   TSH 0.958 09/14/2016   Lab Results  Component Value Date   WBC 4.9 01/05/2018   HGB 15.2 01/05/2018   HCT 44.3 01/05/2018   MCV 86.7 01/05/2018   PLT 135 (L) 01/05/2018   Lab Results  Component Value Date   NA 134 (L) 03/12/2021   K 4.7 03/12/2021   CO2 25 03/12/2021   GLUCOSE 326 (H) 03/12/2021   BUN 17 03/12/2021   CREATININE 0.90 03/12/2021   BILITOT 0.8 02/15/2021   ALKPHOS 76 02/15/2021   AST 32 02/15/2021   ALT 33 02/15/2021   PROT 7.2 02/15/2021   ALBUMIN 4.2 02/15/2021   CALCIUM 8.9 03/12/2021   ANIONGAP 8 03/12/2021   Lab Results  Component Value Date   CHOL 115 02/15/2021   Lab Results  Component Value Date   HDL 27 (L) 02/15/2021   Lab Results  Component Value Date   LDLCALC 51 02/15/2021   Lab Results  Component Value Date   TRIG 185 (H) 02/15/2021   Lab Results  Component Value Date   CHOLHDL 4.3 02/15/2021   Lab Results  Component Value Date   HGBA1C 7.7 04/13/2021      Assessment & Plan:   Problem List Items Addressed This Visit       Cardiovascular and Mediastinum   Essential hypertension     Patient denies any chest pain or shortness of breath there is no history of palpitation or paroxysmal nocturnal dyspnea   patient was advised to follow low-salt low-cholesterol diet    ideally I want to keep systolic blood pressure below 130 mmHg, patient was asked to check blood pressure one times a week and give me a report on that.  Patient will be follow-up in 3 months  or earlier as needed, patient will call me back for  any change in the cardiovascular symptoms Patient was advised to buy a book from local bookstore concerning blood pressure and read several chapters  every day.  This will be supplemented by some of the material we will give him from the office.  Patient should also utilize other resources like YouTube and Internet to learn more about the blood pressure and the diet.         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 - The patient will continue the current treatment regimen.  - I encouraged the patient to regularly check blood sugar.  - I encouraged the patient to monitor diet. I encouraged the patient to eat low-carb and low-sugar to help prevent blood sugar spikes.  - I encouraged the patient to continue following their prescribed treatment plan for diabetes - I informed the patient to get help if blood sugar drops below '54mg'$ /dL, or if suddenly  have trouble thinking clearly or breathing.  Patient was advised to buy a book on diabetes from a local bookstore or from [place]  Patient should read 2 chapters every day to keep the motivation going, this is in addition to some of the materials we provided them from the office.  There are other resources on the Internet like YouTube and wilkipedia to get an education on the diabetes       Relevant Orders   POCT glucose (manual entry) (Completed)   POCT HgB A1C (Completed)     Other   Anxiety    - Patient experiencing high levels of anxiety.  - Encouraged patient to engage in relaxing activities like yoga, meditation, journaling, going for a walk, or participating in a hobby.  - Encouraged patient to reach out to trusted friends or family members about recent struggles, Patient was advised to read A book, how to stop worrying and start living, it is  good book to read to control  the stress        Mixed hyperlipidemia    Hypercholesterolemia  I advised the patient to follow Mediterranean diet This diet is rich in fruits vegetables and whole grain, and This diet is also rich in fish and lean meat Patient should also eat a handful of almonds or walnuts daily Recent heart study indicated that average follow-up on this kind of diet reduces the cardiovascular mortality by 50 to 70%==       Sore throat    Patient was started on Z-Pak       Relevant Medications   azithromycin (ZITHROMAX) 250 MG tablet   Other Relevant Orders   POC COVID-19 (Completed)    Meds ordered this encounter  Medications   azithromycin (ZITHROMAX) 250 MG tablet    Sig: Take 2 tablets on day 1, then 1 tablet daily on days 2 through 5    Dispense:  6 tablet    Refill:  0    Follow-up: No follow-ups on file.    Cletis Athens, MD

## 2021-04-13 NOTE — Assessment & Plan Note (Signed)

## 2021-04-14 ENCOUNTER — Other Ambulatory Visit: Payer: Self-pay | Admitting: Internal Medicine

## 2021-06-03 ENCOUNTER — Ambulatory Visit: Payer: Medicare HMO | Admitting: Cardiovascular Disease

## 2021-06-28 ENCOUNTER — Other Ambulatory Visit: Payer: Self-pay

## 2021-06-28 ENCOUNTER — Encounter: Payer: Self-pay | Admitting: Physician Assistant

## 2021-06-28 ENCOUNTER — Ambulatory Visit (INDEPENDENT_AMBULATORY_CARE_PROVIDER_SITE_OTHER): Payer: Medicare HMO | Admitting: Physician Assistant

## 2021-06-28 VITALS — BP 164/78 | HR 59 | Ht 64.0 in | Wt 168.0 lb

## 2021-06-28 DIAGNOSIS — E785 Hyperlipidemia, unspecified: Secondary | ICD-10-CM

## 2021-06-28 DIAGNOSIS — I6529 Occlusion and stenosis of unspecified carotid artery: Secondary | ICD-10-CM

## 2021-06-28 DIAGNOSIS — I2510 Atherosclerotic heart disease of native coronary artery without angina pectoris: Secondary | ICD-10-CM | POA: Diagnosis not present

## 2021-06-28 DIAGNOSIS — Z79899 Other long term (current) drug therapy: Secondary | ICD-10-CM | POA: Diagnosis not present

## 2021-06-28 DIAGNOSIS — G453 Amaurosis fugax: Secondary | ICD-10-CM | POA: Diagnosis not present

## 2021-06-28 DIAGNOSIS — I779 Disorder of arteries and arterioles, unspecified: Secondary | ICD-10-CM

## 2021-06-28 DIAGNOSIS — E119 Type 2 diabetes mellitus without complications: Secondary | ICD-10-CM | POA: Diagnosis not present

## 2021-06-28 DIAGNOSIS — I5033 Acute on chronic diastolic (congestive) heart failure: Secondary | ICD-10-CM | POA: Diagnosis not present

## 2021-06-28 DIAGNOSIS — I10 Essential (primary) hypertension: Secondary | ICD-10-CM | POA: Diagnosis not present

## 2021-06-28 NOTE — Progress Notes (Signed)
Office Visit    Patient Name: Scott Doyle Date of Encounter: 06/28/2021  PCP:  Cletis [place], [place]  Cardiologist:  Kathlyn Sacramento, MD  Advanced Practice Provider:  No care team member to display Electrophysiologist:  None [phone]   Chief Complaint    Chief Complaint  Patient presents with   Follow-up    60 y.o. male with history of CAD s/p CABG x1, hypertension, hyperlipidemia, and DM 2, and who presents for follow-up.  Past Medical History    Past Medical History:  Diagnosis Date   Anxiety    Coronary artery disease    a. s/p prior CABG x 1 (LIMA->LAD) and PCI to the D2; b. 01/2012 PCI: patent prox Diag stent w/ sev distal dzs->DES x 1; c. 08/2014 Cath: LM nl, LAD 10m, D2 patent stents w/ 7m, LCX nl, RCA 40p, LIMA->LAD nl, EF 60%.   Diabetes mellitus    Hyperlipidemia    Hypertension    Thrombocytopathia [facility])    Past Surgical History:  Procedure Laterality Date   CARDIAC CATHETERIZATION     CARDIAC CATHETERIZATION  08/2014   Patent LIMA to LAD and patent diagonal stents.   CORONARY ARTERY BYPASS GRAFT  2001   [facility]    Allergies  Allergies  Allergen Reactions   Ampicillin Hives   Penicillins Hives and Swelling    History of Present Illness    Scott Doyle is a 60 y.o. male with PMH as above and including CAD's s/p remote CABG x1 with placement of the LIMA to the LAD.  He reports feeling back then that he started having sx at work with CP that increased to the point of needing to go to the ED. He also had burning between his shoulder pain. He quit smoking then. He subsequently underwent PCI to the second diagonal with repeat PCI in May 2013 secondary to more distal disease requiring drug-eluting stent placement.  His last cardiac catheterization was in December 2016, revealing patent diagonal stents.  He has since undergone treadmill testing in 2018, which was reportedly normal.  Other history includes hypertension,  hyperlipidemia, anxiety, and DM2.  He was seen 05/2018, with some chest wall soreness, though more likely musculoskeletal in etiology, as there was some tenderness as well.  Seen 09/20/2019 with LUQ pain, described as different from previous chest wall soreness.  He had occasional dizziness with plans to follow-up with his PCP for carotid studies.  He had occasional left ear tinnitus going to cut back salt, alcohol, and sugar.  Seen 12/11/2020 and reported orthostasis but was not drinking much water throughout the day. He did report drinking diet Pepsi, coffee, and wine.  He had racing heart rate.  He had atypical chest discomfort/tightness that improved with walking around/exertion.  He reported pain in the center of his back and between his shoulder blades.  He had DOE while walking the dog.    12/15/2020 MPI was without significant ischemia or scar and ruled in overall low risk study.  Attenuation correction CT showed post CABG findings and coronary artery calcification.  Risk factor modification recommended.   01/06/2021 echo showed EF 60 to 65%, NR WMA, G2 DD, mild LAE.  We reviewed that his EF improved from previous report of 45% with patient understanding.  BP control recommended.   Seen 01/11/2021 with pain between his shoulder blades without clear triggers but other sx resolved.  He would occasionally add salt to his food.  Reds vest 37%.  Wt increase noted with pt started on torsemide 10 mg x 3 days then sliding scale thereafter.  Seen 02/2021 with sx improved with start of diuresis and diuresis changed to daily thereafter. He was working to cut back on wine and down to 1 glass per day. He had occasional orthostasis and floaters.  Torsemide was changed to daily dosing.  He was seen by his primary cardiologist 03/18/2021 and doing well with recommendation to consider Jardiance at RTC.  Today, 06/28/2021, he returns to clinic and notes that he is overall doing well from his previous clinic visit. He recently  relocated down to [facility] and has been very busy with this move.  He reports that his family helped him a lot with the moving.  He has ongoing upper epigastric and atypical CP. He has new orthopnea and bendopnea with wt gain.  He notes recently eating lots of oodles and noodles with recommendation to eliminate this from his diet.  Salt and fluid restrictions again discussed.  He has some presyncope and amaurosis fugax.  He reports medication compliance.  He notes that he is relatively sedentary with encouragement to increase activity as tolerated.  Home Medications   Current Outpatient Medications  Medication Instructions   ACCU-CHEK AVIVA PLUS test strip USE ONE STRIP DAILY AS DIRECTED   aspirin 81 mg, Daily   atenolol (TENORMIN) 25 MG tablet TAKE 1 TABLET BY MOUTH EVERY DAY   atorvastatin (LIPITOR) 40 MG tablet TAKE 1 TABLET BY MOUTH EVERY DAY   ezetimibe (ZETIA) 10 mg, Oral, Daily   glipiZIDE (GLUCOTROL XL) 2.5 mg, Oral, Daily   Insulin Pen Needle (PEN NEEDLES) 33G X 4 MM MISC Use to administer insulin daily   isosorbide mononitrate (IMDUR) 120 mg, Oral, Daily   LANTUS SOLOSTAR 100 UNIT/ML Solostar Pen INJECT 40 UNITS AS DIRECTED   metFORMIN (GLUCOPHAGE) 1000 MG tablet TAKE 1 TABLET BY MOUTH TWICE A DAY   nitroGLYCERIN (NITROSTAT) 0.4 mg, Sublingual, Every 5 min PRN, Maximum of 3 doses.   omeprazole (PRILOSEC) 20 mg, Oral, Daily   quinapril (ACCUPRIL) 40 MG tablet TAKE 1 TABLET BY MOUTH EVERY DAY   torsemide (DEMADEX) 10 mg, Oral, Daily   traZODone (DESYREL) 50 MG tablet TAKE 1/2 TO 1 TABLET BY MOUTH AT BEDTIME AS NEEDED FOR SLEEP.     Review of Systems    He reports ongoing amaurosis fugax, orthostasis, presyncope. He has ongoing upper epigastric pain and atypical CP. He notes new orthopnea/bendopnea and wt gain in the setting of eating lots of oodles of noodles.  Denies palpitations, n, v, syncope, edema, or early satiety. All other systems reviewed and are otherwise negative except  as noted above.  Physical Exam    VS:  BP (!) 164/78 (BP Location: Left Arm, Patient Position: Sitting, Cuff Size: Normal)   Pulse (!) 59   Ht 5\' 4"  (1.626 m)   Wt 168 lb (76.2 kg)   SpO2 98%   BMI 28.84 kg/m  , BMI Body mass index is 28.84 kg/m. GEN: Well nourished, well developed, in no acute distress. Mask in place. HEENT: normal. Neck: Supple, JVP approximately 9 cm.  No carotid bruits or masses. Cardiac: Bradycardic but regular, 1/6 systolic murmur.  No rubs or gallops. No clubbing, cyanosis, or pitting edema.  Radials/DP/PT 2+ and equal bilaterally. Respiratory:  Trace bilateral crackles. Respirations regular and unlabored. GI: Firm, distended, BS + x 4. MS: no deformity or atrophy. Skin: warm and dry, no rash. Neuro:  Strength and sensation are intact. Psych: Normal affect.  Accessory Clinical Findings    ECG personally reviewed by me today - no EKG  VITALS Reviewed today   Temp Readings from Last 3 Encounters:  05/15/20 (!) 96.4 F (35.8 C)  12/31/17 98.1 F (36.7 C) (Oral)   BP Readings from Last 3 Encounters:  06/28/21 (!) 164/78  04/13/21 (!) 165/82  03/18/21 138/64   Pulse Readings from Last 3 Encounters:  06/28/21 (!) 59  04/13/21 (!) 59  03/18/21 61    Wt Readings from Last 3 Encounters:  06/28/21 168 lb (76.2 kg)  04/13/21 161 lb 9.6 oz (73.3 kg)  03/18/21 167 lb (75.8 kg)     LABS  reviewed today   Lab Results  Component Value Date   WBC 4.9 01/05/2018   HGB 15.2 01/05/2018   HCT 44.3 01/05/2018   MCV 86.7 01/05/2018   PLT 135 (L) 01/05/2018   Lab Results  Component Value Date   CREATININE 0.90 03/12/2021   BUN 17 03/12/2021   NA 134 (L) 03/12/2021   K 4.7 03/12/2021   CL 101 03/12/2021   CO2 25 03/12/2021   Lab Results  Component Value Date   ALT 33 02/15/2021   AST 32 02/15/2021   ALKPHOS 76 02/15/2021   BILITOT 0.8 02/15/2021   Lab Results  Component Value Date   CHOL 115 02/15/2021   HDL 27 (L) 02/15/2021   LDLCALC  51 02/15/2021   LDLDIRECT 60.1 02/15/2021   TRIG 185 (H) 02/15/2021   CHOLHDL 4.3 02/15/2021    Lab Results  Component Value Date   HGBA1C 7.7 04/13/2021   Lab Results  Component Value Date   TSH 0.958 09/14/2016     STUDIES/PROCEDURES reviewed today   Echo 01/2021  1. Left ventricular ejection fraction, by estimation, is 60 to 65%. The  left ventricle has normal function. The left ventricle has no regional  wall motion abnormalities. Left ventricular diastolic parameters are  consistent with Grade II diastolic  dysfunction (pseudonormalization).   2. Right ventricular systolic function is normal. The right ventricular  size is normal.   3. Left atrial size was mildly dilated.   4. The mitral valve is normal in structure. No evidence of mitral valve  regurgitation.   5. The aortic valve is tricuspid. Aortic valve regurgitation is not  visualized. Mild aortic valve sclerosis is present, with no evidence of  aortic valve stenosis.  Comparison(s): Previous LVEF reported as 45%.   MPI Normal pharmacologic myocardial perfusion stress test without significant ischemia or scar. The left ventricular ejection fraction is normal (LVEF 53% by QGS, 74% by Siemens). Attenuation correction CT is notable for post CABG findings and coronary artery calcification. This is a low risk study.    Assessment & Plan    Acute on chronic HFpEF/G2DD (EF 60 to 65% 01/2021) -- Reports sx of overload and appears volume up on exam. Discussed Na/fluid intake. He will eliminate oodles of noodles from his diet. Increase to torsemide 20mg  x4 days and then drop back to torsemide 10mg  daily . Will obtain BMET, BNP today and at RTC. Consider addition of SGLT2i if Cr/K stable when rechecking labs. Previous  EF 60 to 65%, G2DD. Recommend ongoing home BP and weight monitoring.     CAD s/p CABG x1 and diagonal stenting x2 --Ongoing atypical CP (not current and infrequent). Echo with nl EF and NRWMA.  MPI low risk.  2016 cath above.  Escalation of BB precluded by  h/o bradycardic rates. Aggressive risk factor modification recommended.  Increase activity as tolerated, ongoing heart healthy diet changes.  Continue current ASA, ACE, BB, nitrates, Zetia, and statin.     Essential Hypertension, goal < 130/80 --BP elevated in the setting of elevated volume status.  Plan for increase to torsemide 20 mg x4 days as above.  Suspect improvement of BP s/p diuresis. Escalation of atenolol precluded by history of bradycardic rates.  Continue Imdur, quinapril, atenolol.  Reviewed salt and fluid restrictions. He will eliminate oodles of noodles.     HLD, LDL goal <70 --Continue Crestor and Zetia with recent LDL as above under labs.  Mild bilateral carotid dz --Mild b/l carotid dz by 2014 carotids with recommendation for repeat studies in 2 years, not yet performed. Reports dizziness and amaurosis fugax. Diabetic eye exams are performed and without retinopathy.  Will update carotids today. Continue ASA, Zetia, statin.    DM2 --Continue quinapril given comorbid hypertension and diabetes. Consider Wilder Glade or Jardiance at RTC or between visits, deferred for now given plan for increased diuresis today and pending repeat labs.         Medication changes: Increase to torsemide 20 mg daily x4 days then back to previous torsemide 10mg  daily Labs ordered: BMET today and recommend at RTC Studies / Imaging ordered: Carotid duplex  Future considerations: SGLT2 inhibitor Disposition: RTC 1 month   *Please be aware that the above documentation was completed voice recognition software and may contain dictation errors.     Arvil Chaco, PA-C [facility]

## 2021-06-28 NOTE — Patient Instructions (Signed)
Medication Instructions:  - Your physician has recommended you make the following change in your medication:   1) Torsemide 10 mg: - take 2 tablets (20 mg) by mouth once daily x 4 days,  - then resume 1 tablet (10 mg) by mouth once daily   *If you need a refill on your cardiac medications before your next appointment, please call your pharmacy*   Lab Work: - Your physician recommends that you have lab work today: BMP  If you have labs (blood work) drawn today and your tests are completely normal, you will receive your results only by: [place] (if you have [place]) OR A paper copy in the mail If you have any lab test that is abnormal or we need to change your treatment, we will call you to review the results.   Testing/Procedures: - Your physician has requested that you have a carotid duplex. This test is an ultrasound of the carotid arteries in your neck. It looks at blood flow through these arteries that supply the brain with blood. Allow one hour for this exam. There are no restrictions or special instructions.    Follow-Up: At [facility], you and your health needs are our priority.  As part of our continuing mission to provide you with exceptional heart care, we have created designated Provider Care Teams.  These Care Teams include your primary Cardiologist (physician) and Advanced Practice Providers (APPs -  Physician Assistants and Nurse Practitioners) who all work together to provide you with the care you need, when you need it.  We recommend signing up for the patient portal called "MyChart".  Sign up information is provided on this After Visit Summary.  MyChart is used to connect with patients for Virtual Visits (Telemedicine).  Patients are able to view lab/test results, encounter notes, upcoming appointments, etc.  Non-urgent messages can be sent to your provider as well.   To learn more about what you can do with MyChart, go to [email or web address].    Your  next appointment:   1 month(s)  The format for your next appointment:   In Person  Provider:   You may see Kathlyn Sacramento, MD or one of the following Advanced Practice Providers on your designated Care Team:   Murray Hodgkins, NP Christell Faith, PA-C Marrianne Mood, PA-C Cadence Kathlen Mody, [place]   Other Instructions  N/a

## 2021-06-29 ENCOUNTER — Ambulatory Visit: Payer: Medicare HMO

## 2021-06-29 DIAGNOSIS — I6529 Occlusion and stenosis of unspecified carotid artery: Secondary | ICD-10-CM

## 2021-06-29 LAB — BASIC METABOLIC PANEL
BUN/Creatinine Ratio: 13 (ref 10–24)
BUN: 12 mg/dL (ref 8–27)
CO2: 22 mmol/L (ref 20–29)
Calcium: 9.3 mg/dL (ref 8.6–10.2)
Chloride: 100 mmol/L (ref 96–106)
Creatinine, Ser: 0.89 mg/dL (ref 0.76–1.27)
Glucose: 260 mg/dL — ABNORMAL HIGH (ref 70–99)
Potassium: 4.6 mmol/L (ref 3.5–5.2)
Sodium: 137 mmol/L (ref 134–144)
eGFR: 97 mL/min/{1.73_m2} (ref >59)

## 2021-06-30 ENCOUNTER — Encounter: Payer: Self-pay | Admitting: Physician Assistant

## 2021-07-01 ENCOUNTER — Telehealth: Payer: Self-pay | Admitting: Physician Assistant

## 2021-07-01 NOTE — Telephone Encounter (Signed)
Patient calling to discuss recent testing results   Patient has recent labs and carotid   Please call

## 2021-07-01 NOTE — Telephone Encounter (Signed)
Reviewed lab results and preliminary carotid results with patient. He verbalized understanding with no further questions at this time.

## 2021-07-07 ENCOUNTER — Other Ambulatory Visit: Payer: Self-pay

## 2021-07-07 MED ORDER — AZITHROMYCIN 250 MG PO TABS: ORAL_TABLET | ORAL | 0 refills | Status: AC

## 2021-07-14 ENCOUNTER — Other Ambulatory Visit: Payer: Self-pay

## 2021-07-14 ENCOUNTER — Ambulatory Visit (INDEPENDENT_AMBULATORY_CARE_PROVIDER_SITE_OTHER): Payer: Medicare HMO

## 2021-07-14 DIAGNOSIS — Z23 Encounter for immunization: Secondary | ICD-10-CM

## 2021-07-17 ENCOUNTER — Other Ambulatory Visit: Payer: Self-pay | Admitting: Internal Medicine

## 2021-07-19 ENCOUNTER — Other Ambulatory Visit: Payer: Self-pay | Admitting: *Deleted

## 2021-07-19 MED ORDER — AZITHROMYCIN 250 MG PO TABS: ORAL_TABLET | ORAL | 0 refills | Status: AC

## 2021-07-22 ENCOUNTER — Other Ambulatory Visit: Payer: Self-pay | Admitting: *Deleted

## 2021-07-22 MED ORDER — FLUCONAZOLE 150 MG PO TABS: 150.0000 mg | ORAL_TABLET | Freq: Once | ORAL | 0 refills | Status: AC

## 2021-07-26 ENCOUNTER — Other Ambulatory Visit (HOSPITAL_COMMUNITY): Payer: Self-pay | Admitting: Physician Assistant

## 2021-07-26 DIAGNOSIS — I6523 Occlusion and stenosis of bilateral carotid arteries: Secondary | ICD-10-CM

## 2021-07-26 NOTE — Progress Notes (Signed)
Cardiology Office Note    Date:  07/27/2021   ID:  Scott Doyle, DOB 05-14-1961, MRN [id number]  PCP:  Cletis Athens, MD  Cardiologist:  Kathlyn Sacramento, MD  Electrophysiologist:  None   Chief Complaint: Follow-up  History of Present Illness:   Scott Doyle is a 60 y.o. male with history of CAD status post one-vessel CABG with LIMA to LAD and PCI of a diagonal branch, HFpEF, COVID in 09/2020, DM2, HTN, and HLD who presents for follow-up of HFpEF.  Last LHC in 08/2014 showed a patent LIMA to LAD with patent stents in the diagonal.  There was mild proximal RCA stenosis and overall no evidence of obstructive CAD.  He was seen in 03/2021 after having been seen 3 times over the spring 2022 with atypical chest pain and exertional dyspnea.  He underwent Lexiscan MPI in 12/2020 which showed no evidence of ischemia with a normal EF.  Echo in 01/2021 showed an EF of 76-28%, grade 2 diastolic dysfunction, mildly dilated left atrium, and no significant valvular abnormalities.  Following this, he was started on low-dose torsemide.  At his visit in 03/2021 he reported stable symptoms of chest pain which had been present for years.  He reported improvement with shortness of breath following initiation of torsemide.  Recommendation was made to consider Jardiance at follow-up visit.  He was most recently seen in the office in 06/2021 with a noted stable weight at 168 pounds (167 pounds at his visit in 03/2021).  He was overall doing well, though did report orthopnea and bendopnea.  He was eating a high salt diet.  He was felt to be volume overloaded with recommendation to increase torsemide to 20 mg daily for 4 days followed by 10 mg daily thereafter.  He also underwent updated carotid artery ultrasound in 06/2021 which showed bilateral 1 to 39% ICA stenosis with antegrade flow of the bilateral vertebral arteries and normal flow hemodynamics within the bilateral subclavian arteries.  He comes in today for  follow-up of the above.  He comes in doing well today.  He does feel like he is breathing better and notes less orthopnea and bendopnea when compared to his last visit.  His weight is down 4 pounds today when compared to his last clinic visit.  He feels less bloated.  No chest pain, palpitations, dizziness, presyncope, or syncope.  No lower extremity swelling.  He is drinking a little over 2 L of fluid per day, though is watching his salt intake now.  He does continue to eat Ramen noodles, though no longer uses the salt packet with them.  Overall, he is pleased with his progress.   Labs independently reviewed: 06/2021 - BUN 12, serum creatinine 0.89, potassium 4.6 04/2021 - A1c 7.7 02/2021 - direct LDL 60, TC 115, TG 185, HDL 27, LDL 51, albumin 4.2, AST/ALT normal 01/2018 - Hgb 15.2, PLT 135 09/2016 - TSH normal  Past Medical History:  Diagnosis Date   Anxiety    Coronary artery disease    a. s/p prior CABG x 1 (LIMA->LAD) and PCI to the D2; b. 01/2012 PCI: patent prox Diag stent w/ sev distal dzs->DES x 1; c. 08/2014 Cath: LM nl, LAD 53m, D2 patent stents w/ 72m, LCX nl, RCA 40p, LIMA->LAD nl, EF 60%.   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   CORONARY ARTERY BYPASS GRAFT  2001   [facility]    Current Medications: Current Meds  Medication Sig   ACCU-CHEK AVIVA PLUS test strip USE ONE STRIP DAILY AS DIRECTED   aspirin 81 MG tablet Take 81 mg by mouth daily.   atenolol (TENORMIN) 25 MG tablet TAKE 1 TABLET BY MOUTH EVERY DAY   atorvastatin (LIPITOR) 40 MG tablet TAKE 1 TABLET BY MOUTH EVERY DAY   empagliflozin (JARDIANCE) 10 MG TABS tablet Take 1 tablet (10 mg total) by mouth daily before breakfast.   ezetimibe (ZETIA) 10 MG tablet Take 1 tablet (10 mg total) by mouth daily.   glipiZIDE  (GLUCOTROL XL) 2.5 MG 24 hr tablet Take 1 tablet (2.5 mg total) by mouth daily.   Insulin Pen Needle (PEN NEEDLES) 33G X 4 MM MISC Use to administer insulin daily   isosorbide mononitrate (IMDUR) 120 MG 24 hr tablet Take 1 tablet (120 mg total) by mouth daily.   LANTUS SOLOSTAR 100 UNIT/ML Solostar Pen INJECT 40 UNITS AS DIRECTED   metFORMIN (GLUCOPHAGE) 1000 MG tablet TAKE 1 TABLET BY MOUTH TWICE A DAY   nitroGLYCERIN (NITROSTAT) 0.4 MG SL tablet Place 1 tablet (0.4 mg total) under the tongue every 5 (five) minutes as needed. Maximum of 3 doses.   omeprazole (PRILOSEC) 20 MG capsule Take 1 capsule (20 mg total) by mouth daily.   quinapril (ACCUPRIL) 40 MG tablet TAKE 1 TABLET BY MOUTH EVERY DAY   torsemide (DEMADEX) 10 MG tablet Take 1 tablet (10 mg total) by mouth daily.   traZODone (DESYREL) 50 MG tablet TAKE 1/2 TO 1 TABLET BY MOUTH AT BEDTIME AS NEEDED FOR SLEEP.    Allergies:   Ampicillin and Penicillins   Social History   Socioeconomic History   Marital status: Married    Spouse name: Not on file   Number of children: Not on file   Years of education: Not on file   Highest education level: Not on file  Occupational History   Not on file  Tobacco Use   Smoking status: Former   Smokeless tobacco: Never  Vaping Use   Vaping Use: Never used  Substance and Sexual Activity   Alcohol use: Yes    Comment: 1-2 glasses of wine occassionally   Drug use: No   Sexual activity: Not on file  Other Topics Concern   Not on file  Social History Narrative   Not on file   Social Determinants of Health   Financial Resource Strain: Not on file  Food Insecurity: Not on file  Transportation Needs: Not on file  Physical Activity: Not on file  Stress: Not on file  Social Connections: Not on file     Family History:  The patient's family history includes Cancer in an other family member; Diabetes in an other family member; Heart disease in his mother; Hypertension in his mother.  ROS:    Review of Systems  Constitutional:  Negative for chills, diaphoresis, fever, malaise/fatigue and weight loss.  HENT:  Negative for congestion.   Eyes:  Negative for discharge and redness.  Respiratory:  Positive for shortness of breath. Negative for cough, sputum production and wheezing.        Improved shortness of breath  Cardiovascular:  Negative for chest pain, palpitations, orthopnea, claudication, leg swelling and PND.  Gastrointestinal:  Negative for abdominal pain, blood in stool, heartburn, melena, nausea and vomiting.  Musculoskeletal:  Negative for falls and myalgias.  Skin:  Negative for rash.  Neurological:  Negative for dizziness, tingling, tremors, sensory change, speech change, focal weakness, loss of consciousness and weakness.  Endo/Heme/Allergies:  Does not bruise/bleed easily.  Psychiatric/Behavioral:  Negative for substance abuse. The patient is not nervous/anxious.   All other systems reviewed and are negative.   EKGs/Labs/Other Studies Reviewed:    Studies reviewed were summarized above. The additional studies were reviewed today:  2D echo 01/2021: 1. Left ventricular ejection fraction, by estimation, is 60 to 65%. The  left ventricle has normal function. The left ventricle has no regional  wall motion abnormalities. Left ventricular diastolic parameters are  consistent with Grade II diastolic  dysfunction (pseudonormalization).   2. Right ventricular systolic function is normal. The right ventricular  size is normal.   3. Left atrial size was mildly dilated.   4. The mitral valve is normal in structure. No evidence of mitral valve  regurgitation.   5. The aortic valve is tricuspid. Aortic valve regurgitation is not  visualized. Mild aortic valve sclerosis is present, with no evidence of  aortic valve stenosis.   Comparison(s): Previous LVEF reported as 45%.  __________  [facility] MPI 12/2020: Normal pharmacologic myocardial perfusion stress test without  significant ischemia or scar. The left ventricular ejection fraction is normal (LVEF 53% by QGS, 74% by Siemens). Attenuation correction CT is notable for post CABG findings and coronary artery calcification. This is a low risk study. __________  LHC 08/2014: Mid LAD 70% stenosis, D2 30% stenosis, proximal RCA 40% stenosis, patent LIMA to LAD, patent diagonal stents, borderline systemic hypertension, mildly elevated LVEDP, EF 60%, medical management recommended.    EKG:  EKG is not ordered today.    Recent Labs: 02/15/2021: ALT 33 06/28/2021: BUN 12; Creatinine, Ser 0.89; Potassium 4.6; Sodium 137  Recent Lipid Panel    Component Value Date/Time   CHOL 115 02/15/2021 1717   CHOL 95 11/25/2011 1201   TRIG 185 (H) 02/15/2021 1717   TRIG 76 11/25/2011 1201   HDL 27 (L) 02/15/2021 1717   HDL 29 (L) 11/25/2011 1201   CHOLHDL 4.3 02/15/2021 1717   VLDL 37 02/15/2021 1717   VLDL 15 11/25/2011 1201   LDLCALC 51 02/15/2021 1717   LDLCALC 51 11/25/2011 1201   LDLDIRECT 60.1 02/15/2021 1717    PHYSICAL EXAM:    VS:  BP 140/70 (BP Location: Left Arm, Patient Position: Sitting, Cuff Size: Normal)   Pulse 68   Ht 5\' 4"  (1.626 m)   Wt 164 lb (74.4 kg)   SpO2 97%   BMI 28.15 kg/m   BMI: Body mass index is 28.15 kg/m.  Physical Exam Vitals reviewed.  Constitutional:      Appearance: He is well-developed.  HENT:     Head: Normocephalic and atraumatic.  Eyes:     General:        Right eye: No discharge.        Left eye: No discharge.  Neck:     Vascular: No JVD.  Cardiovascular:     Rate and Rhythm: Normal rate and regular rhythm.     Pulses:          Posterior tibial pulses are 2+ on the right side and 2+ on the left side.     Heart sounds: Normal heart sounds, S1 normal and S2 normal. Heart sounds not distant. No midsystolic click and no opening snap. No murmur heard.   No friction rub.  Pulmonary:     Effort: Pulmonary effort is normal. No respiratory distress.      Breath sounds: Normal breath sounds. No decreased breath sounds, wheezing or rales.  Chest:     Chest wall: No tenderness.  Abdominal:     General: There is no distension.     Palpations: Abdomen is soft.     Tenderness: There is no abdominal tenderness.  Musculoskeletal:     Cervical back: Normal range of motion.     Right lower leg: No edema.     Left lower leg: No edema.  Skin:    General: Skin is warm and dry.     Nails: There is no clubbing.  Neurological:     Mental Status: He is alert and oriented to person, place, and time.  Psychiatric:        Speech: Speech normal.        Behavior: Behavior normal.        Thought Content: Thought content normal.        Judgment: Judgment normal.    Wt Readings from Last 3 Encounters:  07/27/21 164 lb (74.4 kg)  06/28/21 168 lb (76.2 kg)  04/13/21 161 lb 9.6 oz (73.3 kg)     ASSESSMENT & PLAN:   CAD status post CABG status post PCI without angina: He is doing well without symptoms concerning for angina.  Recent Lexiscan MPI earlier this year showed no evidence of ischemia.  Continue risk factor modification and secondary prevention including aspirin, atenolol, Imdur, atorvastatin, Zetia, and quinapril.  No indication for further ischemic testing at this time  HFpEF: He appears euvolemic and well compensated.  Weight is down 4 pounds today when compared to his visit on 06/28/2021.  Start Jardiance 10 mg daily.  Check BMP today and again in 1 week after initiating SGLT2i.  Based on follow-up labs, we may be able to transition torsemide to as needed dosing.  Optimal blood pressure control, fluid restriction, and salt management is recommended.  HTN: Blood pressure is mildly elevated in the office today, though is improved when compared to last office visit.  Initiate Jardiance as outlined above.  Otherwise, he will remain on atenolol, Imdur, quinapril, and torsemide.  HLD: LDL 51 in 02/2021.  He remains on atorvastatin 40 mg and Zetia 10  mg.  Mild bilateral carotid artery disease: Carotid artery ultrasound in 06/2021 showed mild 1 to 39% bilateral ICA stenosis with recommendation to follow-up in 2 years.  He remains on aspirin, atorvastatin, and Zetia.  DM2: A1c 7.7 in 04/2021.  Initiate Jardiance as outlined above.  Follow-up with PCP as directed.    Disposition: F/u with Dr. Fletcher Anon or an APP in 3 months.   Medication Adjustments/Labs and Tests Ordered: Current medicines are reviewed at length with the patient today.  Concerns regarding medicines are outlined above. Medication changes, Labs and Tests ordered today are summarized above and listed in the Patient Instructions accessible in Encounters.   Signed, Christell Faith, PA-C 07/27/2021 4:19 PM     [address] [place], [place] [address] [phone]

## 2021-07-27 ENCOUNTER — Ambulatory Visit: Payer: Medicare HMO | Admitting: Physician Assistant

## 2021-07-27 ENCOUNTER — Encounter: Payer: Self-pay | Admitting: Physician Assistant

## 2021-07-27 ENCOUNTER — Other Ambulatory Visit: Payer: Self-pay

## 2021-07-27 VITALS — BP 140/70 | HR 68 | Ht 64.0 in | Wt 164.0 lb

## 2021-07-27 DIAGNOSIS — I2510 Atherosclerotic heart disease of native coronary artery without angina pectoris: Secondary | ICD-10-CM | POA: Diagnosis not present

## 2021-07-27 DIAGNOSIS — I779 Disorder of arteries and arterioles, unspecified: Secondary | ICD-10-CM | POA: Diagnosis not present

## 2021-07-27 DIAGNOSIS — E785 Hyperlipidemia, unspecified: Secondary | ICD-10-CM | POA: Diagnosis not present

## 2021-07-27 DIAGNOSIS — I5032 Chronic diastolic (congestive) heart failure: Secondary | ICD-10-CM

## 2021-07-27 DIAGNOSIS — I10 Essential (primary) hypertension: Secondary | ICD-10-CM

## 2021-07-27 DIAGNOSIS — E119 Type 2 diabetes mellitus without complications: Secondary | ICD-10-CM

## 2021-07-27 DIAGNOSIS — F319 Bipolar disorder, unspecified: Secondary | ICD-10-CM | POA: Diagnosis not present

## 2021-07-27 MED ORDER — EMPAGLIFLOZIN 10 MG PO TABS: 10.0000 mg | ORAL_TABLET | Freq: Every day | ORAL | 6 refills | Status: DC

## 2021-07-27 NOTE — Progress Notes (Signed)
Medication Samples have been provided to the patient.  Drug name: Jardiance        Strength: 10 mg         Qty: 2 boxes   LOT: [id number]   Exp.Date: May 2024

## 2021-07-27 NOTE — Patient Instructions (Addendum)
Medication Instructions:  Your physician has recommended you make the following change in your medication:   START Jardiance 10 mg once a day  *If you need a refill on your cardiac medications before your next appointment, please call your pharmacy*   Lab Work:  BMET today BMET in one week after starting Jardiance. This will be done here in our office.  If you have labs (blood work) drawn today and your tests are completely normal, you will receive your results only by: [facility] (if you have MyChart) OR A paper copy in the mail If you have any lab test that is abnormal or we need to change your treatment, we will call you to review the results.   Testing/Procedures: None   Follow-Up: At [facility], you and your health needs are our priority.  As part of our continuing mission to provide you with exceptional heart care, we have created designated Provider Care Teams.  These Care Teams include your primary Cardiologist (physician) and Advanced Practice Providers (APPs -  Physician Assistants and Nurse Practitioners) who all work together to provide you with the care you need, when you need it.   Your next appointment:   3 month(s)  The format for your next appointment:   In Person  Provider:   Kathlyn Sacramento, MD or Christell Faith, PA-C   Medication Samples have been provided to the patient.  Drug name: Jardiance        Strength: 10 mg         Qty: 2 boxes   LOT: [id number]   Exp.Date: May 2024

## 2021-07-28 LAB — BASIC METABOLIC PANEL
BUN/Creatinine Ratio: 13 (ref 10–24)
BUN: 14 mg/dL (ref 8–27)
CO2: 21 mmol/L (ref 20–29)
Calcium: 9.7 mg/dL (ref 8.6–10.2)
Chloride: 101 mmol/L (ref 96–106)
Creatinine, Ser: 1.09 mg/dL (ref 0.76–1.27)
Glucose: 276 mg/dL — ABNORMAL HIGH (ref 70–99)
Potassium: 5 mmol/L (ref 3.5–5.2)
Sodium: 138 mmol/L (ref 134–144)
eGFR: 77 mL/min/{1.73_m2} (ref >59)

## 2021-08-03 ENCOUNTER — Other Ambulatory Visit (INDEPENDENT_AMBULATORY_CARE_PROVIDER_SITE_OTHER): Payer: Medicare HMO

## 2021-08-03 ENCOUNTER — Other Ambulatory Visit: Payer: Self-pay

## 2021-08-03 DIAGNOSIS — I779 Disorder of arteries and arterioles, unspecified: Secondary | ICD-10-CM | POA: Diagnosis not present

## 2021-08-03 DIAGNOSIS — I2510 Atherosclerotic heart disease of native coronary artery without angina pectoris: Secondary | ICD-10-CM

## 2021-08-03 DIAGNOSIS — E785 Hyperlipidemia, unspecified: Secondary | ICD-10-CM

## 2021-08-03 DIAGNOSIS — E119 Type 2 diabetes mellitus without complications: Secondary | ICD-10-CM | POA: Diagnosis not present

## 2021-08-03 DIAGNOSIS — I10 Essential (primary) hypertension: Secondary | ICD-10-CM | POA: Diagnosis not present

## 2021-08-03 DIAGNOSIS — I5032 Chronic diastolic (congestive) heart failure: Secondary | ICD-10-CM | POA: Diagnosis not present

## 2021-08-04 ENCOUNTER — Other Ambulatory Visit: Payer: Self-pay | Admitting: *Deleted

## 2021-08-04 ENCOUNTER — Telehealth: Payer: Self-pay | Admitting: Physician Assistant

## 2021-08-04 LAB — BASIC METABOLIC PANEL
BUN/Creatinine Ratio: 14 (ref 10–24)
BUN: 16 mg/dL (ref 8–27)
CO2: 14 mmol/L — ABNORMAL LOW (ref 20–29)
Calcium: 9.3 mg/dL (ref 8.6–10.2)
Chloride: 102 mmol/L (ref 96–106)
Creatinine, Ser: 1.14 mg/dL (ref 0.76–1.27)
Glucose: 288 mg/dL — ABNORMAL HIGH (ref 70–99)
Potassium: 4.9 mmol/L (ref 3.5–5.2)
Sodium: 141 mmol/L (ref 134–144)
eGFR: 73 mL/min/{1.73_m2} (ref >59)

## 2021-08-04 MED ORDER — GLIPIZIDE ER 2.5 MG PO TB24: 2.5000 mg | ORAL_TABLET | Freq: Every day | ORAL | 3 refills | Status: DC

## 2021-08-04 NOTE — Telephone Encounter (Signed)
Rise Mu, PA-C  08/04/2021 12:39 PM EST     Renal function and potassium is stable on newly started SGLT2i.  Random glucose remains elevated.   Recommendations: -Continue current medical therapy -Follow-up with PCP as directed for ongoing diabetic care

## 2021-08-04 NOTE — Telephone Encounter (Signed)
1st attempt to contact the patient. No answer- I left a message to please call back.

## 2021-08-06 NOTE — Telephone Encounter (Signed)
Patient calling to discuss recent testing results  ° °Please call  ° °

## 2021-08-09 ENCOUNTER — Other Ambulatory Visit: Payer: Self-pay

## 2021-08-09 ENCOUNTER — Other Ambulatory Visit: Payer: Self-pay | Admitting: *Deleted

## 2021-08-09 DIAGNOSIS — I119 Hypertensive heart disease without heart failure: Secondary | ICD-10-CM

## 2021-08-09 MED ORDER — TORSEMIDE 10 MG PO TABS: 10.0000 mg | ORAL_TABLET | Freq: Every day | ORAL | 0 refills | Status: DC

## 2021-08-09 MED ORDER — QUINAPRIL HCL 40 MG PO TABS: 40.0000 mg | ORAL_TABLET | Freq: Every day | ORAL | 5 refills | Status: DC

## 2021-08-09 NOTE — Telephone Encounter (Signed)
*  STAT* If patient is at the pharmacy, call can be transferred to refill team.   1. Which medications need to be refilled? (please list name of each medication and dose if known) Torsemide  2. Which pharmacy/location (including street and city if local pharmacy) is medication to be sent to? [facility]  3. Do they need a 30 day or 90 day supply? [place]

## 2021-08-09 NOTE — Telephone Encounter (Signed)
Reviewed results and recommendations with patient and he verbalized understanding with no further questions at this time.  

## 2021-09-01 ENCOUNTER — Ambulatory Visit (INDEPENDENT_AMBULATORY_CARE_PROVIDER_SITE_OTHER): Payer: Medicare HMO

## 2021-09-01 ENCOUNTER — Telehealth: Payer: Self-pay

## 2021-09-01 DIAGNOSIS — Z0000 Encounter for general adult medical examination without abnormal findings: Secondary | ICD-10-CM

## 2021-09-01 DIAGNOSIS — Z1211 Encounter for screening for malignant neoplasm of colon: Secondary | ICD-10-CM | POA: Diagnosis not present

## 2021-09-01 NOTE — Telephone Encounter (Signed)
Patient want to be scheduled in the spring

## 2021-09-01 NOTE — Progress Notes (Signed)
Subjective:   Scott Doyle is a 60 y.o. male who presents for Medicare Annual/Subsequent preventive examination. I discussed the limitations of evaluation and management by telemedicine and the availability of in person appointments. The patient expressed understanding and agreed to proceed.   Visit performed by audio   Patient location: Home  Provider location: Home  Review of Systems    N/A Cardiac Risk Factors include: none     Objective:    There were no vitals filed for this visit. There is no height or weight on file to calculate BMI.  Advanced Directives 09/01/2021  Does Patient Have a Medical Advance Directive? No  Some encounter information is confidential and restricted. Go to Review Flowsheets activity to see all data.    Current Medications (verified) Outpatient Encounter Medications as of 09/01/2021  Medication Sig   ACCU-CHEK AVIVA PLUS test strip USE ONE STRIP DAILY AS DIRECTED   aspirin 81 MG tablet Take 81 mg by mouth daily.   atenolol (TENORMIN) 25 MG tablet TAKE 1 TABLET BY MOUTH EVERY DAY   atorvastatin (LIPITOR) 40 MG tablet TAKE 1 TABLET BY MOUTH EVERY DAY   empagliflozin (JARDIANCE) 10 MG TABS tablet Take 1 tablet (10 mg total) by mouth daily before breakfast.   ezetimibe (ZETIA) 10 MG tablet Take 1 tablet (10 mg total) by mouth daily.   glipiZIDE (GLUCOTROL XL) 2.5 MG 24 hr tablet Take 1 tablet (2.5 mg total) by mouth daily.   Insulin Pen Needle (PEN NEEDLES) 33G X 4 MM MISC Use to administer insulin daily   isosorbide mononitrate (IMDUR) 120 MG 24 hr tablet Take 1 tablet (120 mg total) by mouth daily.   LANTUS SOLOSTAR 100 UNIT/ML Solostar Pen INJECT 40 UNITS AS DIRECTED   metFORMIN (GLUCOPHAGE) 1000 MG tablet TAKE 1 TABLET BY MOUTH TWICE A DAY   nitroGLYCERIN (NITROSTAT) 0.4 MG SL tablet Place 1 tablet (0.4 mg total) under the tongue every 5 (five) minutes as needed. Maximum of 3 doses.   omeprazole (PRILOSEC) 20 MG capsule Take 1 capsule (20  mg total) by mouth daily.   quinapril (ACCUPRIL) 40 MG tablet Take 1 tablet (40 mg total) by mouth daily.   torsemide (DEMADEX) 10 MG tablet Take 1 tablet (10 mg total) by mouth daily.   traZODone (DESYREL) 50 MG tablet TAKE 1/2 TO 1 TABLET BY MOUTH AT BEDTIME AS NEEDED FOR SLEEP.   No facility-administered encounter medications on file as of 09/01/2021.    Allergies (verified) Ampicillin and Penicillins   History: Past Medical History:  Diagnosis Date   Anxiety    Coronary artery disease    a. s/p prior CABG x 1 (LIMA->LAD) and PCI to the D2; b. 01/2012 PCI: patent prox Diag stent w/ sev distal dzs->DES x 1; c. 08/2014 Cath: LM nl, LAD 80m, D2 patent stents w/ 31m, LCX nl, RCA 40p, LIMA->LAD nl, EF 60%.   Diabetes mellitus    Hyperlipidemia    Hypertension    Thrombocytopathia [facility])    Past Surgical History:  Procedure Laterality Date   CARDIAC CATHETERIZATION     CARDIAC CATHETERIZATION  08/2014   Patent LIMA to LAD and patent diagonal stents.   CORONARY ARTERY BYPASS GRAFT  2001   [facility]   Family History  Problem Relation Age of Onset   Hypertension Mother    Heart disease Mother    Diabetes Other        fx   Cancer Other  mat. family   Social History   Socioeconomic History   Marital status: Married    Spouse name: DRESEAN BECKEL   Number of children: Not on file   Years of education: Not on file   Highest education level: GED or equivalent  Occupational History   Not on file  Tobacco Use   Smoking status: Former    Types: Cigarettes    Quit date: 2001    Years since quitting: 22.0   Smokeless tobacco: Never  Vaping Use   Vaping Use: Never used  Substance and Sexual Activity   Alcohol use: Yes    Comment: 1-2 glasses of wine occassionally   Drug use: No   Sexual activity: Not Currently  Other Topics Concern   Not on file  Social History Narrative   Not on file   Social Determinants of Health   Financial Resource Strain: Low Risk    Difficulty  of Paying Living Expenses: Not very hard  Food Insecurity: No Food Insecurity   Worried About Charity fundraiser in the Last Year: Never true   [place] in the Last Year: Never true  Transportation Needs: No Transportation Needs   Lack of Transportation (Medical): No   Lack of Transportation (Non-Medical): No  Physical Activity: Sufficiently Active   Days of Exercise per Week: 5 days   Minutes of Exercise per Session: 30 min  Stress: No Stress Concern Present   Feeling of Stress : Not at all  Social Connections: Socially Integrated   Frequency of Communication with Friends and Family: More than three times a week   Frequency of Social Gatherings with Friends and Family: More than three times a week   Attends Religious Services: More than 4 times per year   Active Member of [employer] or Organizations: Yes   Attends Music therapist: More than 4 times per year   Marital Status: Married    Tobacco Counseling Counseling given: Not Answered   Clinical Intake:  Pre-visit preparation completed: Yes  Pain : No/denies pain     Diabetes: Yes  What is the last grade level you completed in school?: GED  Diabetic? Yes  Interpreter Needed?: No  Information entered by :: Anson [place] CMA   Activities of Daily Living In your present state of health, do you have any difficulty performing the following activities: 09/01/2021  Hearing? N  Vision? Y  Comment Pt needs a new glasses rx  Difficulty concentrating or making decisions? N  Walking or climbing stairs? N  Dressing or bathing? N  Doing errands, shopping? N  Preparing Food and eating ? N  Using the Toilet? N  In the past six months, have you accidently leaked urine? N  Do you have problems with loss of bowel control? N  Managing your Medications? N  Managing your Finances? N  Housekeeping or managing your Housekeeping? N  Some recent data might be hidden    Patient Care Team: Cletis Athens, MD as  PCP - General (Internal Medicine) Wellington Hampshire, MD as PCP - Cardiology (Cardiology)  Indicate any recent Medical Services you may have received from other than [facility] providers in the past year (date may be approximate).     Assessment:   This is a routine wellness examination for [place].  Hearing/Vision screen No results found.  Dietary issues and exercise activities discussed: Current Exercise Habits: Home exercise routine, Time (Minutes): 30, Frequency (Times/Week): 5, Weekly Exercise (Minutes/Week): 150, Exercise limited  by: None identified   Goals Addressed   None    Depression Screen PHQ 2/9 Scores 09/01/2021  PHQ - 2 Score 0    Fall Risk Fall Risk  09/01/2021 04/13/2021  Falls in the past year? 0 0  Number falls in past yr: 0 0  Injury with Fall? 0 0  Risk for fall due to : No Fall Risks No Fall Risks  Follow up Falls evaluation completed Falls evaluation completed    [place]:  Any stairs in or around the home? No  If so, are there any without handrails? No  Home free of loose throw rugs in walkways, pet beds, electrical cords, etc? Yes  Adequate lighting in your home to reduce risk of falls? Yes   ASSISTIVE DEVICES UTILIZED TO PREVENT FALLS:  Life alert? No  Use of a cane, walker or w/c? No  Grab bars in the bathroom? No  Shower chair or bench in shower? No  Elevated toilet seat or a handicapped toilet? No   TIMED UP AND GO:  Was the test performed? No .  Length of time to ambulate 10 feet: 0 sec.     Cognitive Function:        Immunizations Immunization History  Administered Date(s) Administered   Influenza,inj,Quad PF,6+ Mos 06/16/2020, 07/14/2021   Moderna Sars-Covid-2 Vaccination 10/21/2019, 11/18/2019, 07/16/2020   Pneumococcal Polysaccharide-23 01/06/2009   Tdap 06/04/2009    TDAP status: Due, Education has been provided regarding the importance of this vaccine. Advised may receive this vaccine at  local pharmacy or Health Dept. Aware to provide a copy of the vaccination record if obtained from local pharmacy or Health Dept. Verbalized acceptance and understanding.  Flu Vaccine status: Due, Education has been provided regarding the importance of this vaccine. Advised may receive this vaccine at local pharmacy or Health Dept. Aware to provide a copy of the vaccination record if obtained from local pharmacy or Health Dept. Verbalized acceptance and understanding.  Pneumococcal vaccine status: Due, Education has been provided regarding the importance of this vaccine. Advised may receive this vaccine at local pharmacy or Health Dept. Aware to provide a copy of the vaccination record if obtained from local pharmacy or Health Dept. Verbalized acceptance and understanding.  Covid-19 vaccine status: Completed vaccines  Qualifies for Shingles Vaccine? No   Zostavax completed No   Shingrix Completed?: No.    Education has been provided regarding the importance of this vaccine. Patient has been advised to call insurance company to determine out of pocket expense if they have not yet received this vaccine. Advised may also receive vaccine at local pharmacy or Health Dept. Verbalized acceptance and understanding.  Screening Tests Health Maintenance  Topic Date Due   FOOT EXAM  Never done   HIV Screening  Never done   Hepatitis C Screening  Never done   Zoster Vaccines- Shingrix (1 of 2) Never done   COLONOSCOPY (Pts 45-66yrs Insurance coverage will need to be confirmed)  Never done   Pneumococcal Vaccine 69-20 Years old (2 - PCV) 01/06/2010   TETANUS/TDAP  06/05/2019   COVID-19 Vaccine (4 - Booster for Moderna series) 09/10/2020   HEMOGLOBIN A1C  10/14/2021   OPHTHALMOLOGY EXAM  01/07/2022   INFLUENZA VACCINE  Completed   HPV VACCINES  Aged Out    Health Maintenance  Health Maintenance Due  Topic Date Due   FOOT EXAM  Never done   HIV Screening  Never done   Hepatitis C  Screening  Never  done   Zoster Vaccines- Shingrix (1 of 2) Never done   COLONOSCOPY (Pts 45-7yrs Insurance coverage will need to be confirmed)  Never done   Pneumococcal Vaccine 5-59 Years old (2 - PCV) 01/06/2010   TETANUS/TDAP  06/05/2019   COVID-19 Vaccine (4 - Booster for Moderna series) 09/10/2020    Colorectal cancer screening: Referral to GI placed 09/01/2021. Pt aware the office will call re: appt.  Lung Cancer Screening: (Low Dose CT Chest recommended if Age 32-80 years, 30 pack-year currently smoking OR have quit w/in 15years.) does not qualify.   Lung Cancer Screening Referral: No  Additional Screening:  Hepatitis C Screening: does not qualify; Completed No  Vision Screening: Recommended annual ophthalmology exams for early detection of glaucoma and other disorders of the eye. Is the patient up to date with their annual eye exam?  No  Who is the provider or what is the name of the office in which the patient attends annual eye exams? [facility] If pt is not established with a provider, would they like to be referred to a provider to establish care? No .   Dental Screening: Recommended annual dental exams for proper oral hygiene  Community Resource Referral / Chronic Care Management: CRR required this visit?  No   CCM required this visit?  No      Plan:     I have personally reviewed and noted the following in the patients chart:   Medical and social history Use of alcohol, tobacco or illicit drugs  Current medications and supplements including opioid prescriptions. Patient is not currently taking opioid prescriptions. Functional ability and status Nutritional status Physical activity Advanced directives List of other physicians Hospitalizations, surgeries, and ER visits in previous 12 months Vitals Screenings to include cognitive, depression, and falls Referrals and appointments  In addition, I have reviewed and discussed with patient certain preventive  protocols, quality metrics, and best practice recommendations. A written personalized care plan for preventive services as well as general preventive health recommendations were provided to patient.    Mr. Kirk , Thank you for taking time to come for your Medicare Wellness Visit. I appreciate your ongoing commitment to your health goals. Please review the following plan we discussed and let me know if I can assist you in the future.   These are the goals we discussed:  Goals   None     This is a list of the screening recommended for you and due dates:  Health Maintenance  Topic Date Due   Complete foot exam   Never done   HIV Screening  Never done   Hepatitis C Screening: USPSTF Recommendation to screen - Ages 15-79 yo.  Never done   Zoster (Shingles) Vaccine (1 of 2) Never done   Colon Cancer Screening  Never done   Pneumococcal Vaccination (2 - PCV) 01/06/2010   Tetanus Vaccine  06/05/2019   COVID-19 Vaccine (4 - Booster for Moderna series) 09/10/2020   Hemoglobin A1C  10/14/2021   Eye exam for diabetics  01/07/2022   Flu Shot  Completed   HPV Vaccine  Aged [address] [place], [place]   09/01/2021   Nurse Notes: Referral placed for colonoscopy. Patient was informed of care gap vaccines. Patient states his blood pressure medicine is on back order, he would like to discuss with Dr. Lavera Guise if he should try an alternative in the meantime.

## 2021-09-01 NOTE — Progress Notes (Signed)
I have reviewed this visit and agree with the documentation.   

## 2021-10-12 ENCOUNTER — Other Ambulatory Visit: Payer: Self-pay | Admitting: *Deleted

## 2021-10-12 MED ORDER — AZITHROMYCIN 250 MG PO TABS: ORAL_TABLET | ORAL | 0 refills | Status: AC

## 2021-10-17 ENCOUNTER — Other Ambulatory Visit: Payer: Self-pay | Admitting: Physician Assistant

## 2021-10-18 ENCOUNTER — Other Ambulatory Visit: Payer: Self-pay

## 2021-10-18 MED ORDER — EZETIMIBE 10 MG PO TABS: 10.0000 mg | ORAL_TABLET | Freq: Every day | ORAL | 0 refills | Status: DC

## 2021-10-20 NOTE — Progress Notes (Signed)
Cardiology Office Note    Date:  10/21/2021   ID:  Scott Doyle, DOB 11/11/1960, MRN [id number]  PCP:  Cletis Athens, MD  Cardiologist:  Kathlyn Sacramento, MD  Electrophysiologist:  None   Chief Complaint: Follow-up  History of Present Illness:   Scott Doyle is a 61 y.o. male with history of CAD status post one-vessel CABG with LIMA to LAD and PCI of a diagonal branch, HFpEF, COVID in 09/2020, DM2, HTN, and HLD who presents for follow-up of HFpEF.   Last LHC in 08/2014 showed a patent LIMA to LAD with patent stents in the diagonal.  There was mild proximal RCA stenosis and overall no evidence of obstructive CAD.  He was seen in 03/2021 after having been seen 3 times over the spring 2022 with atypical chest pain and exertional dyspnea.  He underwent Lexiscan MPI in 12/2020 which showed no evidence of ischemia with a normal EF.  Echo in 01/2021 showed an EF of 67-34%, grade 2 diastolic dysfunction, mildly dilated left atrium, and no significant valvular abnormalities.  Following this, he was started on low-dose torsemide.  At his visit in 03/2021 he reported stable symptoms of chest pain which had been present for years.  He reported improvement with shortness of breath following initiation of torsemide.  Recommendation was made to consider Jardiance at follow-up visit.   He was seen in the office in 06/2021 with a noted stable weight at 168 pounds.  He was overall doing well, though did report orthopnea and bendopnea.  He was eating a high salt diet.  He was felt to be volume overloaded with recommendation to increase torsemide to 20 mg daily for 4 days followed by 10 mg daily thereafter.  He also underwent updated carotid artery ultrasound in 06/2021 which showed bilateral 1 to 39% ICA stenosis with antegrade flow of the bilateral vertebral arteries and normal flow hemodynamics within the bilateral subclavian arteries.  He was last seen in the office in 07/2021 and was doing well from a cardiac  perspective.  He fell at his breathing was better, and noted less orthopnea/bendopnea.  His weight was down 4 pounds when compared to his prior clinic visit.  He was initiated on Jardiance for further optimization of medical therapy.  He comes in doing very well from a cardiac perspective.  Since he was last seen he has noted resolution of his chest tightness and no longer notes any orthopnea/bendopnea.  He is now sleeping on 1 pillow.  His weight is down another 4 pounds today when compared to his last clinic visit.  He has tolerated the addition of Jardiance without significant issues.  He does feel like he is dehydrated at times, and with this has occasionally increased his water intake.  No significant lower extremity swelling or abdominal distention.  He is watching his salt intake.  No symptoms of angina or decompensation.  His blood pressure is mildly elevated today, though he has been out of quinapril for approximately 2 weeks while waiting for the pharmacy to reacquire stock.  Overall, he is pleased with his improvement and does not have any active cardiac issues or concerns at this time.   Labs independently reviewed: 07/2021 - BUN 16, serum creatinine 1.14, potassium 4.9  04/2021 - A1c 7.7 02/2021 - direct LDL 60, TC 115, TG 185, HDL 27, LDL 51, albumin 4.2, AST/ALT normal 01/2018 - Hgb 15.2, PLT 135 09/2016 - TSH normal  Past Medical History:  Diagnosis Date  Anxiety    Coronary artery disease    a. s/p prior CABG x 1 (LIMA->LAD) and PCI to the D2; b. 01/2012 PCI: patent prox Diag stent w/ sev distal dzs->DES x 1; c. 08/2014 Cath: LM nl, LAD 72m, D2 patent stents w/ 65m, LCX nl, RCA 40p, LIMA->LAD nl, EF 60%.   Diabetes mellitus    Hyperlipidemia    Hypertension    Thrombocytopathia [facility])     Past Surgical History:  Procedure Laterality Date   CARDIAC CATHETERIZATION     CARDIAC CATHETERIZATION  08/2014   Patent LIMA to LAD and patent diagonal stents.   CORONARY ARTERY BYPASS  GRAFT  2001   [facility]    Current Medications: Current Meds  Medication Sig   ACCU-CHEK AVIVA PLUS test strip USE ONE STRIP DAILY AS DIRECTED   aspirin 81 MG tablet Take 81 mg by mouth daily.   empagliflozin (JARDIANCE) 10 MG TABS tablet Take 1 tablet (10 mg total) by mouth daily before breakfast.   glipiZIDE (GLUCOTROL XL) 2.5 MG 24 hr tablet Take 1 tablet (2.5 mg total) by mouth daily.   Insulin Pen Needle (PEN NEEDLES) 33G X 4 MM MISC Use to administer insulin daily   LANTUS SOLOSTAR 100 UNIT/ML Solostar Pen INJECT 40 UNITS AS DIRECTED (Patient taking differently: INJECT 50 UNITS AS DIRECTED)   metFORMIN (GLUCOPHAGE) 1000 MG tablet TAKE 1 TABLET BY MOUTH TWICE A DAY   nitroGLYCERIN (NITROSTAT) 0.4 MG SL tablet Place 1 tablet (0.4 mg total) under the tongue every 5 (five) minutes as needed. Maximum of 3 doses.   omeprazole (PRILOSEC) 20 MG capsule Take 1 capsule (20 mg total) by mouth daily.   traZODone (DESYREL) 50 MG tablet TAKE 1/2 TO 1 TABLET BY MOUTH AT BEDTIME AS NEEDED FOR SLEEP.   [DISCONTINUED] atenolol (TENORMIN) 25 MG tablet TAKE 1 TABLET BY MOUTH EVERY DAY   [DISCONTINUED] atorvastatin (LIPITOR) 40 MG tablet TAKE 1 TABLET BY MOUTH EVERY DAY   [DISCONTINUED] ezetimibe (ZETIA) 10 MG tablet Take 1 tablet (10 mg total) by mouth daily.   [DISCONTINUED] isosorbide mononitrate (IMDUR) 120 MG 24 hr tablet Take 1 tablet (120 mg total) by mouth daily.   [DISCONTINUED] quinapril (ACCUPRIL) 40 MG tablet Take 1 tablet (40 mg total) by mouth daily.   [DISCONTINUED] torsemide (DEMADEX) 10 MG tablet TAKE 1 TABLET BY MOUTH EVERY DAY    Allergies:   Ampicillin and Penicillins   Social History   Socioeconomic History   Marital status: Married    Spouse name: SEBASTHIAN STAILEY   Number of children: Not on file   Years of education: Not on file   Highest education level: GED or equivalent  Occupational History   Not on file  Tobacco Use   Smoking status: Former    Types: Cigarettes    Quit  date: 2001    Years since quitting: 22.1   Smokeless tobacco: Never  Vaping Use   Vaping Use: Never used  Substance and Sexual Activity   Alcohol use: Yes    Comment: 1-2 glasses of wine occassionally   Drug use: No   Sexual activity: Not Currently  Other Topics Concern   Not on file  Social History Narrative   Not on file   Social Determinants of Health   Financial Resource Strain: Low Risk    Difficulty of Paying Living Expenses: Not very hard  Food Insecurity: No Food Insecurity   Worried About Running Out of Food in the Last Year: Never true  Ran Out of Food in the Last Year: Never true  Transportation Needs: No Transportation Needs   Lack of Transportation (Medical): No   Lack of Transportation (Non-Medical): No  Physical Activity: Sufficiently Active   Days of Exercise per Week: 5 days   Minutes of Exercise per Session: 30 min  Stress: No Stress Concern Present   Feeling of Stress : Not at all  Social Connections: Socially Integrated   Frequency of Communication with Friends and Family: More than three times a week   Frequency of Social Gatherings with Friends and Family: More than three times a week   Attends Religious Services: More than 4 times per year   Active Member of [employer] or Organizations: Yes   Attends Music therapist: More than 4 times per year   Marital Status: Married     Family History:  The patient's family history includes Cancer in an other family member; Diabetes in an other family member; Heart disease in his mother; Hypertension in his mother.  ROS:   12-point review of systems is negative unless otherwise noted in the HPI.   EKGs/Labs/Other Studies Reviewed:    Studies reviewed were summarized above. The additional studies were reviewed today:  2D echo 01/2021: 1. Left ventricular ejection fraction, by estimation, is 60 to 65%. The  left ventricle has normal function. The left ventricle has no regional  wall motion  abnormalities. Left ventricular diastolic parameters are  consistent with Grade II diastolic  dysfunction (pseudonormalization).   2. Right ventricular systolic function is normal. The right ventricular  size is normal.   3. Left atrial size was mildly dilated.   4. The mitral valve is normal in structure. No evidence of mitral valve  regurgitation.   5. The aortic valve is tricuspid. Aortic valve regurgitation is not  visualized. Mild aortic valve sclerosis is present, with no evidence of  aortic valve stenosis.   Comparison(s): Previous LVEF reported as 45%.  __________   [facility] MPI 12/2020: Normal pharmacologic myocardial perfusion stress test without significant ischemia or scar. The left ventricular ejection fraction is normal (LVEF 53% by QGS, 74% by Siemens). Attenuation correction CT is notable for post CABG findings and coronary artery calcification. This is a low risk study. __________   LHC 08/2014: Mid LAD 70% stenosis, D2 30% stenosis, proximal RCA 40% stenosis, patent LIMA to LAD, patent diagonal stents, borderline systemic hypertension, mildly elevated LVEDP, EF 60%, medical management recommended.   EKG:  EKG is not ordered today.    Recent Labs: 02/15/2021: ALT 33 08/03/2021: BUN 16; Creatinine, Ser 1.14; Potassium 4.9; Sodium 141  Recent Lipid Panel    Component Value Date/Time   CHOL 115 02/15/2021 1717   CHOL 95 11/25/2011 1201   TRIG 185 (H) 02/15/2021 1717   TRIG 76 11/25/2011 1201   HDL 27 (L) 02/15/2021 1717   HDL 29 (L) 11/25/2011 1201   CHOLHDL 4.3 02/15/2021 1717   VLDL 37 02/15/2021 1717   VLDL 15 11/25/2011 1201   LDLCALC 51 02/15/2021 1717   LDLCALC 51 11/25/2011 1201   LDLDIRECT 60.1 02/15/2021 1717    PHYSICAL EXAM:    VS:  BP (!) 150/78 (BP Location: Left Arm, Patient Position: Sitting, Cuff Size: Normal)    Pulse 60    Ht 5\' 6"  (1.676 m)    Wt 160 lb 2 oz (72.6 kg)    SpO2 99%    BMI 25.84 kg/m   BMI: Body mass index is 25.84  kg/m.  Physical Exam Vitals reviewed.  Constitutional:      Appearance: He is well-developed.  HENT:     Head: Normocephalic and atraumatic.  Eyes:     General:        Right eye: No discharge.        Left eye: No discharge.  Neck:     Vascular: No JVD.  Cardiovascular:     Rate and Rhythm: Normal rate and regular rhythm.     Pulses:          Posterior tibial pulses are 2+ on the right side and 2+ on the left side.     Heart sounds: Normal heart sounds, S1 normal and S2 normal. Heart sounds not distant. No midsystolic click and no opening snap. No murmur heard.   No friction rub.  Pulmonary:     Effort: Pulmonary effort is normal. No respiratory distress.     Breath sounds: Normal breath sounds. No decreased breath sounds, wheezing or rales.  Chest:     Chest wall: No tenderness.  Abdominal:     General: There is no distension.     Palpations: Abdomen is soft.     Tenderness: There is no abdominal tenderness.  Musculoskeletal:     Cervical back: Normal range of motion.     Right lower leg: No edema.     Left lower leg: No edema.  Skin:    General: Skin is warm and dry.     Nails: There is no clubbing.  Neurological:     Mental Status: He is alert and oriented to person, place, and time.  Psychiatric:        Speech: Speech normal.        Behavior: Behavior normal.        Thought Content: Thought content normal.        Judgment: Judgment normal.    Wt Readings from Last 3 Encounters:  10/21/21 160 lb 2 oz (72.6 kg)  07/27/21 164 lb (74.4 kg)  06/28/21 168 lb (76.2 kg)     ASSESSMENT & PLAN:   CAD status post CABG status post PCI without angina: He is doing very well without symptoms concerning for angina.  Lexiscan MPI in 2022 showed no evidence of ischemia.  Continue resector modification and secondary prevention including aspirin, atenolol, Imdur, atorvastatin, and Zetia.  No indication for further ischemic testing at this time.  HFpEF: He is euvolemic and well  compensated with a weight that is down 4 pounds today when compared to his last clinic visit.  He has tolerated Jardiance without issues.  This medication will be continued.  We will transition his torsemide to 10 mg as needed shortness of breath, lower extremity swelling, or weight gain greater than 3 pounds overnight or 5 pounds in a 7-day timeframe.  Check BMP today.  Optimal blood pressure control is encouraged.  CHF education.  HTN: Blood pressure is mildly elevated in the office today.  He declines transition of quinapril to alternative medication.  He will let us know if this medication remains on backorder.  Otherwise, he will continue medications as outlined above.  HLD: LDL 51 in 02/2021.  He remains on atorvastatin and ezetimibe.  Mild bilateral carotid artery disease: Carotid artery ultrasound in 06/2021 demonstrated 1 to 39% bilateral ICA stenosis with recommendation to follow-up in 2 years.  DM2: A1c 7.7 in 04/2021.  He remains on Jardiance.  Follow-up with PCP as directed.    Disposition: F/u with Dr. Fletcher Anon or  an APP in 6 months.   Medication Adjustments/Labs and Tests Ordered: Current medicines are reviewed at length with the patient today.  Concerns regarding medicines are outlined above. Medication changes, Labs and Tests ordered today are summarized above and listed in the Patient Instructions accessible in Encounters.   Signed, Christell Faith, PA-C 10/21/2021 4:48 PM     [address], [place] [address] [phone]

## 2021-10-21 ENCOUNTER — Ambulatory Visit: Payer: Medicare HMO | Admitting: Physician Assistant

## 2021-10-21 ENCOUNTER — Other Ambulatory Visit
Admission: RE | Admit: 2021-10-21 | Discharge: 2021-10-21 | Disposition: A | Discharge status: Home / Self Care | Payer: Medicare HMO | Attending: Physician Assistant | Admitting: Physician Assistant

## 2021-10-21 ENCOUNTER — Encounter: Payer: Self-pay | Admitting: Physician Assistant

## 2021-10-21 ENCOUNTER — Other Ambulatory Visit: Payer: Self-pay

## 2021-10-21 VITALS — BP 150/78 | HR 60 | Ht 66.0 in | Wt 160.1 lb

## 2021-10-21 DIAGNOSIS — I6523 Occlusion and stenosis of bilateral carotid arteries: Secondary | ICD-10-CM

## 2021-10-21 DIAGNOSIS — E119 Type 2 diabetes mellitus without complications: Secondary | ICD-10-CM

## 2021-10-21 DIAGNOSIS — I5032 Chronic diastolic (congestive) heart failure: Secondary | ICD-10-CM

## 2021-10-21 DIAGNOSIS — I2510 Atherosclerotic heart disease of native coronary artery without angina pectoris: Secondary | ICD-10-CM

## 2021-10-21 DIAGNOSIS — I517 Cardiomegaly: Secondary | ICD-10-CM | POA: Diagnosis not present

## 2021-10-21 DIAGNOSIS — I10 Essential (primary) hypertension: Secondary | ICD-10-CM | POA: Diagnosis not present

## 2021-10-21 DIAGNOSIS — E785 Hyperlipidemia, unspecified: Secondary | ICD-10-CM

## 2021-10-21 DIAGNOSIS — I119 Hypertensive heart disease without heart failure: Secondary | ICD-10-CM

## 2021-10-21 LAB — BASIC METABOLIC PANEL
Anion gap: 9 (ref 5–15)
BUN: 18 mg/dL (ref 8–23)
CO2: 25 mmol/L (ref 22–32)
Calcium: 9.8 mg/dL (ref 8.9–10.3)
Chloride: 104 mmol/L (ref 98–111)
Creatinine, Ser: 0.81 mg/dL (ref 0.61–1.24)
GFR, Estimated: >60 mL/min (ref >60)
Glucose, Bld: 146 mg/dL — ABNORMAL HIGH (ref 70–99)
Potassium: 4.3 mmol/L (ref 3.5–5.1)
Sodium: 138 mmol/L (ref 135–145)

## 2021-10-21 MED ORDER — QUINAPRIL HCL 40 MG PO TABS: 40.0000 mg | ORAL_TABLET | Freq: Every day | ORAL | 5 refills | Status: DC

## 2021-10-21 MED ORDER — ATORVASTATIN CALCIUM 40 MG PO TABS: 40.0000 mg | ORAL_TABLET | Freq: Every day | ORAL | 3 refills | Status: DC

## 2021-10-21 MED ORDER — TORSEMIDE 10 MG PO TABS: 10.0000 mg | ORAL_TABLET | Freq: Every day | ORAL | 0 refills | Status: DC | PRN

## 2021-10-21 MED ORDER — EZETIMIBE 10 MG PO TABS: 10.0000 mg | ORAL_TABLET | Freq: Every day | ORAL | 3 refills | Status: DC

## 2021-10-21 MED ORDER — ATENOLOL 25 MG PO TABS: 25.0000 mg | ORAL_TABLET | Freq: Every day | ORAL | 3 refills | Status: DC

## 2021-10-21 MED ORDER — ISOSORBIDE MONONITRATE ER 120 MG PO TB24: 120.0000 mg | ORAL_TABLET | Freq: Every day | ORAL | 3 refills | Status: DC

## 2021-10-21 NOTE — Patient Instructions (Signed)
Medication Instructions:  Your physician has recommended you make the following change in your medication:   CHANGE Torsemide 10 mg as needed for shortness of breath, swelling, or weight gain.  *If you need a refill on your cardiac medications before your next appointment, please call your pharmacy*   Lab Work: BMET today. Go to [facility] and check in registration.   If you have labs (blood work) drawn today and your tests are completely normal, you will receive your results only by: [place] (if you have MyChart) OR A paper copy in the mail If you have any lab test that is abnormal or we need to change your treatment, we will call you to review the results.   Testing/Procedures: None   Follow-Up: At [facility], you and your health needs are our priority.  As part of our continuing mission to provide you with exceptional heart care, we have created designated Provider Care Teams.  These Care Teams include your primary Cardiologist (physician) and Advanced Practice Providers (APPs -  Physician Assistants and Nurse Practitioners) who all work together to provide you with the care you need, when you need it.   Your next appointment:   6 month(s)  The format for your next appointment:   In Person  Provider:   Kathlyn Sacramento, MD or Christell Faith, PA-C

## 2021-11-26 ENCOUNTER — Other Ambulatory Visit: Payer: Self-pay | Admitting: Internal Medicine

## 2021-12-25 ENCOUNTER — Emergency Department: Payer: Medicare HMO

## 2021-12-25 ENCOUNTER — Observation Stay
Admission: EM | Admit: 2021-12-25 | Discharge: 2021-12-26 | Disposition: A | Discharge status: Home / Self Care | Payer: Medicare HMO | Attending: Internal Medicine | Admitting: Internal Medicine

## 2021-12-25 ENCOUNTER — Other Ambulatory Visit: Payer: Self-pay

## 2021-12-25 ENCOUNTER — Inpatient Hospital Stay (HOSPITAL_BASED_OUTPATIENT_CLINIC_OR_DEPARTMENT_OTHER)
Admit: 2021-12-25 | Discharge: 2021-12-25 | Disposition: A | Discharge status: Home / Self Care | Payer: Medicare HMO | Attending: Internal Medicine | Admitting: Internal Medicine

## 2021-12-25 DIAGNOSIS — Z87891 Personal history of nicotine dependence: Secondary | ICD-10-CM | POA: Insufficient documentation

## 2021-12-25 DIAGNOSIS — Z7982 Long term (current) use of aspirin: Secondary | ICD-10-CM | POA: Insufficient documentation

## 2021-12-25 DIAGNOSIS — E785 Hyperlipidemia, unspecified: Secondary | ICD-10-CM | POA: Diagnosis present

## 2021-12-25 DIAGNOSIS — R200 Anesthesia of skin: Secondary | ICD-10-CM | POA: Diagnosis not present

## 2021-12-25 DIAGNOSIS — R41841 Cognitive communication deficit: Secondary | ICD-10-CM | POA: Diagnosis not present

## 2021-12-25 DIAGNOSIS — I639 Cerebral infarction, unspecified: Secondary | ICD-10-CM | POA: Diagnosis not present

## 2021-12-25 DIAGNOSIS — Z79899 Other long term (current) drug therapy: Secondary | ICD-10-CM | POA: Diagnosis not present

## 2021-12-25 DIAGNOSIS — Z951 Presence of aortocoronary bypass graft: Secondary | ICD-10-CM | POA: Insufficient documentation

## 2021-12-25 DIAGNOSIS — I10 Essential (primary) hypertension: Secondary | ICD-10-CM | POA: Diagnosis not present

## 2021-12-25 DIAGNOSIS — I6389 Other cerebral infarction: Secondary | ICD-10-CM | POA: Diagnosis not present

## 2021-12-25 DIAGNOSIS — I110 Hypertensive heart disease with heart failure: Secondary | ICD-10-CM | POA: Insufficient documentation

## 2021-12-25 DIAGNOSIS — Z8673 Personal history of transient ischemic attack (TIA), and cerebral infarction without residual deficits: Secondary | ICD-10-CM | POA: Diagnosis present

## 2021-12-25 DIAGNOSIS — E1129 Type 2 diabetes mellitus with other diabetic kidney complication: Secondary | ICD-10-CM | POA: Diagnosis not present

## 2021-12-25 DIAGNOSIS — I6782 Cerebral ischemia: Secondary | ICD-10-CM | POA: Diagnosis not present

## 2021-12-25 DIAGNOSIS — Z7984 Long term (current) use of oral hypoglycemic drugs: Secondary | ICD-10-CM | POA: Diagnosis not present

## 2021-12-25 DIAGNOSIS — Z794 Long term (current) use of insulin: Secondary | ICD-10-CM | POA: Diagnosis not present

## 2021-12-25 DIAGNOSIS — R531 Weakness: Secondary | ICD-10-CM | POA: Diagnosis not present

## 2021-12-25 DIAGNOSIS — I6381 Other cerebral infarction due to occlusion or stenosis of small artery: Secondary | ICD-10-CM | POA: Diagnosis present

## 2021-12-25 DIAGNOSIS — I2510 Atherosclerotic heart disease of native coronary artery without angina pectoris: Secondary | ICD-10-CM | POA: Diagnosis not present

## 2021-12-25 DIAGNOSIS — I5032 Chronic diastolic (congestive) heart failure: Secondary | ICD-10-CM | POA: Insufficient documentation

## 2021-12-25 DIAGNOSIS — G319 Degenerative disease of nervous system, unspecified: Secondary | ICD-10-CM | POA: Diagnosis not present

## 2021-12-25 LAB — DIFFERENTIAL
Abs Immature Granulocytes: 0.03 10*3/uL (ref 0.00–0.07)
Basophils Absolute: 0.1 10*3/uL (ref 0.0–0.1)
Basophils Relative: 1 %
Eosinophils Absolute: 0.2 10*3/uL (ref 0.0–0.5)
Eosinophils Relative: 3 %
Immature Granulocytes: 0 %
Lymphocytes Relative: 34 %
Lymphs Abs: 2.7 10*3/uL (ref 0.7–4.0)
Monocytes Absolute: 0.5 10*3/uL (ref 0.1–1.0)
Monocytes Relative: 6 %
Neutro Abs: 4.4 10*3/uL (ref 1.7–7.7)
Neutrophils Relative %: 56 %

## 2021-12-25 LAB — URINE DRUG SCREEN, QUALITATIVE (ARMC ONLY)
Amphetamines, Ur Screen: NOT DETECTED
Barbiturates, Ur Screen: NOT DETECTED
Benzodiazepine, Ur Scrn: NOT DETECTED
Cannabinoid 50 Ng, Ur ~~LOC~~: POSITIVE — AB
Cocaine Metabolite,Ur ~~LOC~~: NOT DETECTED
MDMA (Ecstasy)Ur Screen: NOT DETECTED
Methadone Scn, Ur: NOT DETECTED
Opiate, Ur Screen: NOT DETECTED
Phencyclidine (PCP) Ur S: NOT DETECTED
Tricyclic, Ur Screen: NOT DETECTED

## 2021-12-25 LAB — ECHOCARDIOGRAM COMPLETE
AR max vel: 2.04 cm2
AV Peak grad: 6.2 mmHg
Ao pk vel: 1.24 m/s
Area-P 1/2: 3.13 cm2
Height: 66 in
S' Lateral: 2.45 cm
Single Plane A4C EF: 67.4 %
Weight: 2643.2 oz

## 2021-12-25 LAB — CBC
HCT: 48.5 % (ref 39.0–52.0)
Hemoglobin: 16.2 g/dL (ref 13.0–17.0)
MCH: 29.5 pg (ref 26.0–34.0)
MCHC: 33.4 g/dL (ref 30.0–36.0)
MCV: 88.2 fL (ref 80.0–100.0)
Platelets: 183 10*3/uL (ref 150–400)
RBC: 5.5 MIL/uL (ref 4.22–5.81)
RDW: 13 % (ref 11.5–15.5)
WBC: 7.9 10*3/uL (ref 4.0–10.5)
nRBC: 0 % (ref 0.0–0.2)

## 2021-12-25 LAB — PROTIME-INR
INR: 0.9 (ref 0.8–1.2)
Prothrombin Time: 12 seconds (ref 11.4–15.2)

## 2021-12-25 LAB — COMPREHENSIVE METABOLIC PANEL
ALT: 27 U/L (ref 0–44)
AST: 32 U/L (ref 15–41)
Albumin: 4.1 g/dL (ref 3.5–5.0)
Alkaline Phosphatase: 60 U/L (ref 38–126)
Anion gap: 13 (ref 5–15)
BUN: 15 mg/dL (ref 8–23)
CO2: 21 mmol/L — ABNORMAL LOW (ref 22–32)
Calcium: 9.2 mg/dL (ref 8.9–10.3)
Chloride: 104 mmol/L (ref 98–111)
Creatinine, Ser: 0.88 mg/dL (ref 0.61–1.24)
GFR, Estimated: >60 mL/min (ref >60)
Glucose, Bld: 234 mg/dL — ABNORMAL HIGH (ref 70–99)
Potassium: 4.6 mmol/L (ref 3.5–5.1)
Sodium: 138 mmol/L (ref 135–145)
Total Bilirubin: 0.8 mg/dL (ref 0.3–1.2)
Total Protein: 7.7 g/dL (ref 6.5–8.1)

## 2021-12-25 LAB — APTT: aPTT: 31 seconds (ref 24–36)

## 2021-12-25 LAB — HEMOGLOBIN A1C
Hgb A1c MFr Bld: 8.7 % — ABNORMAL HIGH (ref 4.8–5.6)
Mean Plasma Glucose: 202.99 mg/dL

## 2021-12-25 LAB — GLUCOSE, CAPILLARY: Glucose-Capillary: 123 mg/dL — ABNORMAL HIGH (ref 70–99)

## 2021-12-25 LAB — BRAIN NATRIURETIC PEPTIDE: B Natriuretic Peptide: 76.2 pg/mL (ref 0.0–100.0)

## 2021-12-25 LAB — CBG MONITORING, ED: Glucose-Capillary: 211 mg/dL — ABNORMAL HIGH (ref 70–99)

## 2021-12-25 MED ORDER — HYDRALAZINE HCL 20 MG/ML IJ SOLN: 5.0000 mg | INTRAMUSCULAR | Status: DC | PRN

## 2021-12-25 MED ORDER — TRAZODONE HCL 50 MG PO TABS: 25.0000 mg | ORAL_TABLET | Freq: Every evening | ORAL | Status: DC | PRN

## 2021-12-25 MED ORDER — INSULIN ASPART 100 UNIT/ML IJ SOLN
0.0000 [IU] | Freq: Three times a day (TID) | INTRAMUSCULAR | Status: DC
  Administered 2021-12-25 – 2021-12-26 (×2): 3 [IU] via SUBCUTANEOUS
  Administered 2021-12-26: 7 [IU] via SUBCUTANEOUS
  Filled 2021-12-25 (×3): qty 1

## 2021-12-25 MED ORDER — INSULIN GLARGINE-YFGN 100 UNIT/ML ~~LOC~~ SOLN
25.0000 [IU] | Freq: Every day | SUBCUTANEOUS | Status: DC
  Administered 2021-12-25 – 2021-12-26 (×2): 25 [IU] via SUBCUTANEOUS
  Filled 2021-12-25 (×2): qty 0.25

## 2021-12-25 MED ORDER — EZETIMIBE 10 MG PO TABS
10.0000 mg | ORAL_TABLET | Freq: Every day | ORAL | Status: DC
  Administered 2021-12-25 – 2021-12-26 (×2): 10 mg via ORAL
  Filled 2021-12-25 (×2): qty 1

## 2021-12-25 MED ORDER — ISOSORBIDE MONONITRATE ER 30 MG PO TB24
120.0000 mg | ORAL_TABLET | Freq: Every day | ORAL | Status: DC
  Administered 2021-12-26: 120 mg via ORAL
  Filled 2021-12-25: qty 4

## 2021-12-25 MED ORDER — INSULIN ASPART 100 UNIT/ML IJ SOLN: 0.0000 [IU] | Freq: Every day | INTRAMUSCULAR | Status: DC

## 2021-12-25 MED ORDER — PANTOPRAZOLE SODIUM 40 MG PO TBEC
40.0000 mg | DELAYED_RELEASE_TABLET | Freq: Every day | ORAL | Status: DC
  Administered 2021-12-25 – 2021-12-26 (×2): 40 mg via ORAL
  Filled 2021-12-25 (×2): qty 1

## 2021-12-25 MED ORDER — ONDANSETRON HCL 4 MG/2ML IJ SOLN: 4.0000 mg | Freq: Three times a day (TID) | INTRAMUSCULAR | Status: DC | PRN

## 2021-12-25 MED ORDER — ACETAMINOPHEN 650 MG RE SUPP: 650.0000 mg | RECTAL | Status: DC | PRN

## 2021-12-25 MED ORDER — SODIUM CHLORIDE 0.9% FLUSH: 3.0000 mL | Freq: Once | INTRAVENOUS | Status: DC

## 2021-12-25 MED ORDER — NITROGLYCERIN 0.4 MG SL SUBL: 0.4000 mg | SUBLINGUAL_TABLET | SUBLINGUAL | Status: DC | PRN

## 2021-12-25 MED ORDER — ENOXAPARIN SODIUM 40 MG/0.4ML IJ SOSY
40.0000 mg | PREFILLED_SYRINGE | INTRAMUSCULAR | Status: DC
  Administered 2021-12-25: 40 mg via SUBCUTANEOUS
  Filled 2021-12-25: qty 0.4

## 2021-12-25 MED ORDER — INSULIN GLARGINE 100 UNIT/ML SOLOSTAR PEN: 25.0000 [IU] | PEN_INJECTOR | Freq: Every day | SUBCUTANEOUS | Status: DC | PRN

## 2021-12-25 MED ORDER — QUINAPRIL HCL 10 MG PO TABS
40.0000 mg | ORAL_TABLET | Freq: Every day | ORAL | Status: DC
  Administered 2021-12-26: 40 mg via ORAL
  Filled 2021-12-25: qty 4

## 2021-12-25 MED ORDER — ASPIRIN EC 81 MG PO TBEC
81.0000 mg | DELAYED_RELEASE_TABLET | Freq: Every day | ORAL | Status: DC
  Administered 2021-12-26: 81 mg via ORAL
  Filled 2021-12-25: qty 1

## 2021-12-25 MED ORDER — ATENOLOL 25 MG PO TABS
25.0000 mg | ORAL_TABLET | Freq: Every day | ORAL | Status: DC
  Administered 2021-12-26: 25 mg via ORAL
  Filled 2021-12-25: qty 1

## 2021-12-25 MED ORDER — SENNOSIDES-DOCUSATE SODIUM 8.6-50 MG PO TABS: 1.0000 | ORAL_TABLET | Freq: Every evening | ORAL | Status: DC | PRN

## 2021-12-25 MED ORDER — STROKE: EARLY STAGES OF RECOVERY BOOK: Freq: Once | Status: DC

## 2021-12-25 MED ORDER — ATORVASTATIN CALCIUM 20 MG PO TABS
40.0000 mg | ORAL_TABLET | Freq: Every day | ORAL | Status: DC
  Administered 2021-12-26: 40 mg via ORAL
  Filled 2021-12-25 (×2): qty 2

## 2021-12-25 MED ORDER — ACETAMINOPHEN 325 MG PO TABS: 650.0000 mg | ORAL_TABLET | ORAL | Status: DC | PRN

## 2021-12-25 MED ORDER — ASPIRIN 81 MG PO CHEW
324.0000 mg | CHEWABLE_TABLET | Freq: Once | ORAL | Status: AC
  Administered 2021-12-25: 324 mg via ORAL
  Filled 2021-12-25: qty 4

## 2021-12-25 MED ORDER — ACETAMINOPHEN 160 MG/5ML PO SOLN
650.0000 mg | ORAL | Status: DC | PRN
  Filled 2021-12-25: qty 20.3

## 2021-12-25 NOTE — Progress Notes (Signed)
Patient arrived to unit from ER in stable condition. Pt ambulated with walker and steady, slightly asymmetrical gait in room.  ?

## 2021-12-25 NOTE — ED Provider Notes (Signed)
? ?[facility] ?Provider Note ? ? ? Event Date/Time  ? First MD Initiated Contact with Patient 12/25/21 [phone]   ?  (approximate) ? ? ?History  ? ?Weakness ? ? ?HPI ? ?Scott Scott is a 61 y.o. male with past medical history of hypertension, hyperlipidemia, diabetes and coronary artery disease status post stenting who presents with left-sided numbness.  Patient cannot recall exactly when symptoms started but says it was several days ago.  Initially describes feeling a numb sensation under the left arm radiating down his left side to the left leg.  It is present most pronounced in the left leg but also does feel that the left side of his face is droopy and of the left arm feels tired/weak.  Denies difficulty with vision speech.  Has had some difficulty walking feels a little bit unsteady due to weakness in the left leg.  Denies chest pain or shortness of breath.  No history of stroke. ?  ? ?Past Medical History:  ?Diagnosis Date  ? Anxiety   ? Coronary artery disease   ? a. s/p prior CABG x 1 (LIMA->LAD) and PCI to the D2; b. 01/2012 PCI: patent prox Diag stent w/ sev distal dzs->DES x 1; c. 08/2014 Cath: LM nl, LAD 42m D2 patent stents w/ 358mLCX nl, RCA 40p, LIMA->LAD nl, EF 60%.  ? Diabetes mellitus   ? Hyperlipidemia   ? Hypertension   ? Thrombocytopathia ([facility] [place]  ? ? ?Patient Active Problem List  ? Diagnosis Date Noted  ? Sore throat 04/13/2021  ? Mixed hyperlipidemia 06/16/2020  ? Essential hypertension 06/16/2020  ? 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 Need for influenza vaccination 06/16/2020  ? Skin cancer of anterior chest 06/16/2020  ? Sinusitis 05/17/2020  ? Bipolar affective disorder ([facility]  ? Schizoaffective disorder (HCHolcomb04/28/2019  ? Anxiety   ? Coronary artery disease   ? Hyperlipidemia   ? Hypertension   ? ? ? ?Physical Exam  ?Triage Vital Signs: ?ED Triage Vitals [12/25/21 0802]  ?Enc Vitals Group  ?   BP (!) 191/81  ?   Pulse Rate (!) 56  ?   Resp 18  ?   Temp  98 ?F (36.7 ?C)  ?   Temp Source Oral  ?   SpO2 99 %  ?   Weight 165 lb 3.2 oz (74.9 kg)  ?   Height '5\' 6"'$  (1.676 m)  ?   Head Circumference   ?   Peak Flow   ?   Pain Score 0  ?   Pain Loc   ?   Pain Edu?   ?   Excl. in [place]  ? ? ?Most recent vital signs: ?Vitals:  ? 12/25/21 0815 12/25/21 0845  ?BP: (!) 165/80   ?Pulse: (!) 59 (!) 59  ?Resp: 19 14  ?Temp:    ?SpO2: 100% 98%  ? ? ? ?General: Awake, no distress.  ?CV:  Good peripheral perfusion.  ?Resp:  Normal effort.  ?Abd:  No distention.  ?Neuro:             Awake, Alert, Oriented x 3  ?Other:  Aox3, nml speech  ?PERRL, EOMI, face symmetric, nml tongue movement  ?5/5 strength in the BL upper and lower extremities  ?Sensation grossly intact in the BL upper and lower extremities  ?Finger-nose-finger intact BL ?Upon standing up patient initially is a little bit unsteady due to what he describes as  discomfort/weakness in the left hip however he is then able to ambulate without difficulty ? ? ?ED Results / Procedures / Treatments  ?Labs ?(all labs ordered are listed, but only abnormal results are displayed) ?Labs Reviewed  ?COMPREHENSIVE METABOLIC PANEL - Abnormal; Notable for the following components:  ?    Result Value  ? CO2 21 (*)   ? Glucose, Bld 234 (*)   ? All other components within normal limits  ?PROTIME-INR  ?APTT  ?CBC  ?DIFFERENTIAL  ?CBG MONITORING, ED  ? ? ? ?EKG ? ?EKG interpreted by myself, normal sinus rhythm normal axis normal intervals nonspecific intraventricular conduction delay T wave inversion in aVL ? ?RADIOLOGY ?I reviewed the CT scan of the brain which does not show any acute intracranial process; agree with radiology report  ? ? ? ?PROCEDURES: ? ?Critical Care performed: No ? ?Procedures ? ?The patient is on the cardiac monitor to evaluate for evidence of arrhythmia and/or significant heart rate changes. ? ? ?MEDICATIONS ORDERED IN ED: ?Medications  ?sodium chloride flush (NS) 0.9 % injection 3 mL ( Intravenous Canceled Entry 12/25/21 0823)   ?aspirin chewable tablet 324 mg (has no administration in time range)  ? ? ? ?IMPRESSION / MDM / ASSESSMENT AND PLAN / ED COURSE  ?I reviewed the triage vital signs and the nursing notes. ?             ?               ? ?Differential diagnosis includes, but is not limited to, peripheral neuropathy, CVA, arthritis ? ?Patient is a 61 year old male presents with subjective left-sided numbness and weakness on the left side of his body.  Difficulty describing exactly when it started but has been going on for several days.  Patient describes a numb/tired feeling in the left side of his body which she describes as including the left face left arm left leg.  Initially denying pain but then later does note that he has some left-sided hip discomfort.  Denies back pain urinary or bowel symptoms.  He has no visual symptoms or difficulty with speech.  Patient's neurologic exam is completely nonfocal for me and he is able to ambulate.  CT head does not show anything acute.  Patient's deficits are quite mild however given they do involve the entire left side of his body to feel like CVA needs to be ruled out.  We will obtain an MRI of the brain. ? ?MRI shows acute right-sided lacunar infarct which would explain his symptoms.  MRA ordered at the same time shows bilateral PCA high-grade stenosis.  Will admit to the hospital service for ongoing stroke work-up.  Will load with 325 aspirin. ? ?  ? ? ?FINAL CLINICAL IMPRESSION(S) / ED DIAGNOSES  ? 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 ? ? ?Rx / DC Orders  ? ?ED Discharge Orders   ? ? None  ? ?  ? ? ? ?Note:  This document was prepared using Dragon voice recognition software and may include unintentional dictation errors. ?  ?Rada Hay, MD ?12/25/21 1002 ? ?

## 2021-12-25 NOTE — H&P (Addendum)
?History and Physical  ? ? ?Scott Doyle YQI:[id number] DOB: 23-Nov-1960 DOA: 12/25/2021 ? ?Referring MD/NP/PA:  ? ?PCP: Cletis Athens, MD  ? ?Patient coming from:  The patient is coming from home.  At baseline, pt is independent for most of ADL.       ? ?Chief Complaint: Left-sided numbness and weakness ? ?HPI: Scott Doyle is a 61 y.o. male with medical history significant of hypertension, hyperlipidemia, diabetes mellitus, CAD, CABG, stent placement, CHF, bipolar, schizoaffective disorder, anxiety, who presents with left-sided numbness and weakness. ? ?Patient states left-sided numbness, tingling and weakness for more than 2 days (patient cannot tell me exactly how many days he has been having the symptoms).  He states that his weakness has improved, but still has tingling and numbness in the left side from left face to leg.  No difficulty swallowing.  No vision loss or hearing loss.  Patient denies chest pain, cough, shortness of breath.  No fever or chills.  Denies nausea, vomiting, diarrhea or abdominal pain.  No symptoms of UTI. ? ? ?Data Reviewed and ED Course: pt was found to have WBC 7.9, INR 0.9, PTT 31, GFR> 60, temperature normal, blood pressure 191/81, 165/80, heart rate 59, RR 14, oxygen saturation 98% on room air.  CT of head is negative.  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  Patient is admitted to telemetry bed as inpatient.  Dr. Cheral Marker of neurology is consulted ? ?MRI/MRA: ?1. Acute lacunar infarct in the right thalamus. ?2. Bilateral high-grade PCA stenosis. The right PCA is fetal type with narrowing at the P2 segment. ? ?KG: I have personally reviewed.  Sinus rhythm, QTc 450, low voltage, poor R wave progression ? ? ?Review of Systems:  ? ?General: no fevers, chills, no body weight gain, fatigue ?HEENT: no blurry vision, hearing changes or sore throat ?Respiratory: no dyspnea, coughing, wheezing ?CV: no chest pain, no palpitations ?GI: no nausea,  vomiting, abdominal pain, diarrhea, constipation ?GU: no dysuria, burning on urination, increased urinary frequency, hematuria  ?Ext: no leg edema ?Neuro: no vision change or hearing loss.  Has left-sided numbness, tingling and weakness ?Skin: no rash, no skin tear. ?MSK: No muscle spasm, no deformity, no limitation of range of movement in spin ?Heme: No easy bruising.  ?Travel history: No recent long distant travel. ? ? ?Allergy:  ?Allergies  ?Allergen Reactions  ? Ampicillin Hives  ? Penicillins Hives and Swelling  ? ? ?Past Medical History:  ?Diagnosis Date  ? Anxiety   ? Coronary artery disease   ? a. s/p prior CABG x 1 (LIMA->LAD) and PCI to the D2; b. 01/2012 PCI: patent prox Diag stent w/ sev distal dzs->DES x 1; c. 08/2014 Cath: LM nl, LAD 50m D2 patent stents w/ 365mLCX nl, RCA 40p, LIMA->LAD nl, EF 60%.  ? Diabetes mellitus   ? Hyperlipidemia   ? Hypertension   ? Thrombocytopathia ([place]  ? ? ?Past Surgical History:  ?Procedure Laterality Date  ? CARDIAC CATHETERIZATION    ? CARDIAC CATHETERIZATION  08/2014  ? Patent LIMA to LAD and patent diagonal stents.  ? CORONARY ARTERY BYPASS GRAFT  2001  ? [place]? ? ?Social History:  reports that he quit smoking about 22 years ago. His smoking use included cigarettes. He has never used smokeless tobacco. He reports current alcohol use. He reports that he does not use drugs. ? ?Family History:  ?Family History  ?Problem Relation Age of Onset  ? Hypertension  Mother   ? Heart disease Mother   ? Diabetes Other   ?     fx  ? Cancer Other   ?     mat. family  ?  ? ?Prior to Admission medications   ?Medication Sig Start Date End Date Taking? Authorizing Provider  ?ACCU-CHEK AVIVA PLUS test strip USE ONE STRIP DAILY AS DIRECTED 11/26/21   Cletis Athens, MD  ?aspirin 81 MG tablet Take 81 mg by mouth daily.    [provider]  ?atenolol (TENORMIN) 25 MG tablet Take 1 tablet (25 mg total) by mouth daily. 10/21/21   Rise Mu, PA-C  ?atorvastatin (LIPITOR) 40 MG  tablet Take 1 tablet (40 mg total) by mouth daily. 10/21/21   Rise Mu, PA-C  ?empagliflozin (JARDIANCE) 10 MG TABS tablet Take 1 tablet (10 mg total) by mouth daily before breakfast. 07/27/21   Rise Mu, PA-C  ?ezetimibe (ZETIA) 10 MG tablet Take 1 tablet (10 mg total) by mouth daily. 10/21/21 10/16/22  Rise Mu, PA-C  ?glipiZIDE (GLUCOTROL XL) 2.5 MG 24 hr tablet Take 1 tablet (2.5 mg total) by mouth daily. 08/04/21   Cletis Athens, MD  ?Insulin Pen Needle (PEN NEEDLES) 33G X 4 MM MISC Use to administer insulin daily 08/03/20   Cletis Athens, MD  ?isosorbide mononitrate (IMDUR) 120 MG 24 hr tablet Take 1 tablet (120 mg total) by mouth daily. 10/21/21 10/16/22  Rise Mu, PA-C  ?LANTUS SOLOSTAR 100 UNIT/ML Solostar Pen INJECT 40 UNITS AS DIRECTED ?Patient taking differently: INJECT 50 UNITS AS DIRECTED 07/18/21   Cletis Athens, MD  ?metFORMIN (GLUCOPHAGE) 1000 MG tablet TAKE 1 TABLET BY MOUTH TWICE A DAY 11/27/20   Cletis Athens, MD  ?nitroGLYCERIN (NITROSTAT) 0.4 MG SL tablet Place 1 tablet (0.4 mg total) under the tongue every 5 (five) minutes as needed. Maximum of 3 doses. 12/11/20   Marrianne Mood D, PA-C  ?omeprazole (PRILOSEC) 20 MG capsule Take 1 capsule (20 mg total) by mouth daily. 04/16/20   Cletis Athens, MD  ?quinapril (ACCUPRIL) 40 MG tablet Take 1 tablet (40 mg total) by mouth daily. 10/21/21   Rise Mu, PA-C  ?torsemide (DEMADEX) 10 MG tablet Take 1 tablet (10 mg total) by mouth daily as needed (As needed for swelling, shortness of breath, or weight gain.). 10/21/21   Rise Mu, PA-C  ?traZODone (DESYREL) 50 MG tablet TAKE 1/2 TO 1 TABLET BY MOUTH AT BEDTIME AS NEEDED FOR SLEEP. 07/09/20   Cletis Athens, MD  ? ? ?Physical Exam: ?Vitals:  ? 12/25/21 0845 12/25/21 1600 12/25/21 1715 12/25/21 1800  ?BP:  140/71 138/74 132/74  ?Pulse: (!) 59 (!) 58 62 62  ?Resp: '14 19 20 18  '$ ?Temp:  98.8 ?F (37.1 ?C) (!) 97.5 ?F (36.4 ?C) 98 ?F (36.7 ?C)  ?TempSrc:  Oral Oral Oral  ?SpO2: 98% 97% 97% 97%   ?Weight:      ?Height:      ? ?General: Not in acute distress ?HEENT: ?      Eyes: PERRL, EOMI, no scleral icterus. ?      ENT: No discharge from the ears and nose, no pharynx injection, no tonsillar enlargement.  ?      Neck: No JVD, no bruit, no mass felt. ?Heme: No neck lymph node enlargement. ?Cardiac: S1/S2, RRR, No murmurs, No gallops or rubs. ?Respiratory: No rales, wheezing, rhonchi or rubs. ?GI: Soft, nondistended, nontender, no rebound pain, no organomegaly, BS present. ?GU: No hematuria ?Ext:  No pitting leg edema bilaterally. 1+DP/PT pulse bilaterally. ?Musculoskeletal: No joint deformities, No joint redness or warmth, no limitation of ROM in spin. ?Skin: No rashes.  ?Neuro: Alert, oriented X3, cranial nerves II-XII grossly intact, moves all extremities normally. Muscle strength 5/5 in all extremities, sensation to light touch decreased in left side from face to leg.  Brachial reflex 2+ bilaterally.  ?Psych: Patient is not psychotic, no suicidal or hemocidal ideation. ? ?Labs on Admission: I have personally reviewed following labs and imaging studies ? ?CBC: ?Recent Labs  ?Lab 12/25/21 ?4010  ?WBC 7.9  ?NEUTROABS 4.4  ?HGB 16.2  ?HCT 48.5  ?MCV 88.2  ?PLT 183  ? ?Basic Metabolic Panel: ?Recent Labs  ?Lab 12/25/21 ?2725  ?NA 138  ?K 4.6  ?CL 104  ?CO2 21*  ?GLUCOSE 234*  ?BUN 15  ?CREATININE 0.88  ?CALCIUM 9.2  ? ?GFR: ?Estimated Creatinine Clearance: 78.5 mL/min (by C-G formula based on SCr of 0.88 mg/dL). ?Liver Function Tests: ?Recent Labs  ?Lab 12/25/21 ?3664  ?AST 32  ?ALT 27  ?ALKPHOS 60  ?BILITOT 0.8  ?PROT 7.7  ?ALBUMIN 4.1  ? ?No results for input(s): LIPASE, AMYLASE in the last 168 hours. ?No results for input(s): AMMONIA in the last 168 hours. ?Coagulation Profile: ?Recent Labs  ?Lab 12/25/21 ?4034  ?INR 0.9  ? ?Cardiac Enzymes: ?No results for input(s): CKTOTAL, CKMB, CKMBINDEX, TROPONINI in the last 168 hours. ?BNP (last 3 results) ?No results for input(s): PROBNP in the last 8760  hours. ?HbA1C: ?No results for input(s): HGBA1C in the last 72 hours. ?CBG: ?Recent Labs  ?Lab 12/25/21 ?1151  ?GLUCAP 211*  ? ?Lipid Profile: ?No results for input(s): CHOL, HDL, LDLCALC, TRIG, CHOLHDL, LDLDIRECT in the last

## 2021-12-25 NOTE — ED Notes (Signed)
Pt up to restroom.

## 2021-12-25 NOTE — Plan of Care (Signed)
?  Problem: Education: ?Goal: Knowledge of patient specific risk factors will improve (INDIVIDUALIZE FOR PATIENT) ?Outcome: Progressing ?  ?

## 2021-12-25 NOTE — Assessment & Plan Note (Signed)
Recent A1c 7.7, poorly controlled.  Patient is taking metformin, Jardiance, glipizide and Lantus 50 units as needed ?-Lantus 25 units daily ?-Sliding scale insulin ?

## 2021-12-25 NOTE — Assessment & Plan Note (Signed)
S/p of CABG and DES stent placement.  No chest pain ?-Continue aspirin, Lipitor, Zetia, imdur ?-As needed nitroglycerin ?

## 2021-12-25 NOTE — Assessment & Plan Note (Addendum)
-  continue quinapril, Atenolol ?-IV hydralazine prn ?

## 2021-12-25 NOTE — Assessment & Plan Note (Addendum)
MRI/MRI showed right thalams infarct with bilateral high-grade PCA stenosis. Dr. Cheral Marker was consulted. ? ? -Admit to tele med bed as inpatient ?- out permissive hypertension window ?- ASA   ?- Zetia and lipitor ?- fasting lipid panel and HbA1c  ?- 2D transthoracic echocardiography  ?- swallowing screen. If fails, will get SLP ?- Check UDS  ?- PT/OT consult ?

## 2021-12-25 NOTE — ED Notes (Signed)
Pt brought from ED, first pt contact at this time. NAD, a/ox4, speaking in clear speech. Pt states he "has felt drunk on the left side x 3 days". Pt endoreses weakness. NIHSS 0 at this time.  ?

## 2021-12-25 NOTE — ED Notes (Signed)
Pt transported to CT ?

## 2021-12-25 NOTE — Assessment & Plan Note (Addendum)
2D echo on 01/06/2021 showed EF of 60 to 65% with grade 2 diastolic dysfunction.  Patient does not have leg edema or shortness of breath.  CHF seem to be compensated. ?-hold torsemide ?-check BNP ?

## 2021-12-25 NOTE — Assessment & Plan Note (Signed)
-  Zetia and lipitor ?

## 2021-12-25 NOTE — Progress Notes (Signed)
*  PRELIMINARY RESULTS* ?Echocardiogram ?2D Echocardiogram has been performed. ? ?Scott Doyle ?12/25/2021, 1:54 PM ?

## 2021-12-25 NOTE — Consult Note (Addendum)
?                    NEURO HOSPITALIST CONSULT NOTE  ? ?Requestig physician: Dr. Blaine Hamper ? ?Reason for Consult: Acute right thalamic lacunar infarction ? ?History obtained from:  Patient and Chart    ? ?HPI:                                                                                                                                         ? Scott Doyle is an 61 y.o. male with a PMHx of HTN, HLD, DM, CAD s/p CABG and stent placement, CHF, bipolar, schizoaffective disorder and anxiety, who presents with 2-3 days of left-sided numbness and weakness. MRI/MRA revealed an acute right thalamic lacunar stroke with bilateral high-grade PCA stenosis. Home medications include ASA and atorvastatin.  ? ?Past Medical History:  ?Diagnosis Date  ? Anxiety   ? Coronary artery disease   ? a. s/p prior CABG x 1 (LIMA->LAD) and PCI to the D2; b. 01/2012 PCI: patent prox Diag stent w/ sev distal dzs->DES x 1; c. 08/2014 Cath: LM nl, LAD 75m D2 patent stents w/ 315mLCX nl, RCA 40p, LIMA->LAD nl, EF 60%.  ? Diabetes mellitus   ? Hyperlipidemia   ? Hypertension   ? [place]  ? ? ?Past Surgical History:  ?Procedure Laterality Date  ? CARDIAC CATHETERIZATION    ? CARDIAC CATHETERIZATION  08/2014  ? Patent LIMA to LAD and patent diagonal stents.  ? CORONARY ARTERY [place]PASS GRAFT  2001  ? [place] ? ?Family History  ?Problem Relation Age of Onset  ? Hypertension Mother   ? Heart disease Mother   ? Diabetes Other   ?     fx  ? Cancer Other   ?     mat. family  ?         ? ?Social History:  reports that he quit smoking about 22 years ago. His smoking use included cigarettes. He has never used smokeless tobacco. He reports current alcohol use. He reports that he does not use drugs. ? ?Allergies  ?Allergen Reactions  ? Ampicillin Hives  ? Penicillins Hives and Swelling  ? ? ?HOME MEDICATIONS:                                                                                                                     ? ?  No current  facility-administered medications on file prior to encounter.  ? ?Current Outpatient Medications on File Prior to Encounter  ?Medication Sig Dispense Refill  ? ACCU-CHEK AVIVA PLUS test strip USE ONE STRIP DAILY AS DIRECTED 100 strip 2  ? aspirin 81 MG tablet Take 81 mg by mouth daily.    ? atenolol (TENORMIN) 25 MG tablet Take 1 tablet (25 mg total) by mouth daily. 90 tablet 3  ? atorvastatin (LIPITOR) 40 MG tablet Take 1 tablet (40 mg total) by mouth daily. 90 tablet 3  ? CANNABIDIOL PO Take 1 tablet by mouth as directed.    ? empagliflozin (JARDIANCE) 10 MG TABS tablet Take 1 tablet (10 mg total) by mouth daily before breakfast. 30 tablet 6  ? ezetimibe (ZETIA) 10 MG tablet Take 1 tablet (10 mg total) by mouth daily. 90 tablet 3  ? glipiZIDE (GLUCOTROL XL) 2.5 MG 24 hr tablet Take 1 tablet (2.5 mg total) by mouth daily. 90 tablet 3  ? Insulin Pen Needle (PEN NEEDLES) 33G X 4 MM MISC Use to administer insulin daily 30 each 6  ? isosorbide mononitrate (IMDUR) 120 MG 24 hr tablet Take 1 tablet (120 mg total) by mouth daily. 90 tablet 3  ? LANTUS SOLOSTAR 100 UNIT/ML Solostar Pen INJECT 40 UNITS AS DIRECTED (Patient taking differently: Inject 50 Units into the skin daily as needed (blood glucose >150).) 15 mL 4  ? metFORMIN (GLUCOPHAGE) 1000 MG tablet TAKE 1 TABLET BY MOUTH TWICE A DAY 180 tablet 4  ? nitroGLYCERIN (NITROSTAT) 0.4 MG SL tablet Place 1 tablet (0.4 mg total) under the tongue every 5 (five) minutes as needed. Maximum of 3 doses. 25 tablet 3  ? omeprazole (PRILOSEC) 20 MG capsule Take 1 capsule (20 mg total) by mouth daily. 90 capsule 3  ? quinapril (ACCUPRIL) 40 MG tablet Take 1 tablet (40 mg total) by mouth daily. 90 tablet 5  ? torsemide (DEMADEX) 10 MG tablet Take 1 tablet (10 mg total) by mouth daily as needed (As needed for swelling, shortness of breath, or weight gain.). 90 tablet 0  ? traZODone (DESYREL) 50 MG tablet TAKE 1/2 TO 1 TABLET BY MOUTH AT BEDTIME AS NEEDED FOR SLEEP. (Patient not  taking: Reported on 12/25/2021) 90 tablet 2  ? ? ? ?ROS:                                                                                                                                       ?States that he feels numb on left side of face, left arm and leg. Also with some left leg weakness. No vision changes. No speech deficits. Face feels weak but no facial droop. Other ROS as per HPI.  ? ? ?Blood pressure (!) 165/80, pulse (!) 59, temperature 98 ?F (36.7 ?C), temperature source Oral, resp. rate 14, height '5\' 6"'$  (1.676 m), weight 74.9 kg, SpO2 98 %. ? ? ?  General Examination:                                                                                                      ? ?Physical Exam  ?HEENT-  [place]/AT    ?Lungs- Respirations unlabored ?Extremities- No edema ? ?Neurological Examination ?Mental Status: Alert, fully oriented, thought content appropriate.  Speech fluent without evidence of aphasia.  Able to follow all commands without difficulty. ?Cranial Nerves: ?II: Temporal visual fields intact with no extinction to DSS. ?III,IV, VI: No ptosis. EOMI. No nystagmus.  ?V: Temp sensation equal bilaterally ?VII: Smile symmetric ?VIII: Hearing intact to voice ?IX,X: No hypophonia or hoarseness ?XI: Symmetric  ?XII: Midline tongue extension ?Motor: ?RUE and RLE 5/5 ?LUE 5/5 except for subtle 4+/5 deltoid, triceps and wrist extensor weakness ?LLE 5/5 except for subtle 4+/5 knee extension weakness ?Sensory: Temp and light touch intact throughout, bilaterally. No extinction to DSS.  ?Deep Tendon Reflexes: 1+ bilateral brachioradialis and biceps. 2+ bilateral patellae. Toes downgoing bilaterally.  ?Cerebellar: No right sided ataxia. Subtle dysmetria with FNF on the left. Left H-S is normal.  ?Gait: Deferred ?  ?Lab Results: ?Basic Metabolic Panel: ?Recent Labs  ?Lab 12/25/21 ?2376  ?NA 138  ?K 4.6  ?CL 104  ?CO2 21*  ?GLUCOSE 234*  ?BUN 15  ?CREATININE 0.88  ?CALCIUM 9.2  ? ? ?CBC: ?Recent Labs  ?Lab 12/25/21 ?2831  ?WBC  7.9  ?NEUTROABS 4.4  ?HGB 16.2  ?HCT 48.5  ?MCV 88.2  ?PLT 183  ? ? ?Cardiac Enzymes: ?No results for input(s): CKTOTAL, CKMB, CKMBINDEX, TROPONINI in the last 168 hours. ? ?Lipid Panel: ?No results for input(s): CHOL, TRIG, HDL, CHOLHDL, VLDL, LDLCALC in the last 168 hours. ? ?Imaging: ?CT HEAD WO CONTRAST ? ?Result Date: 12/25/2021 ?CLINICAL DATA:  61 year old male with history of acute onset of left-sided numbness and weakness. Suspected stroke. EXAM: CT HEAD WITHOUT CONTRAST TECHNIQUE: Contiguous axial images were obtained from the base of the skull through the vertex without intravenous contrast. RADIATION DOSE REDUCTION: This exam was performed according to the departmental dose-optimization program which includes automated exposure control, adjustment of the mA and/or kV according to patient size and/or use of iterative reconstruction technique. COMPARISON:  Head CT 01/02/2018. FINDINGS: Brain: Mild cerebral atrophy. Patchy areas of mild decreased attenuation are noted throughout the deep and periventricular white matter of the cerebral hemispheres bilaterally, compatible with mild chronic microvascular ischemic disease. No evidence of acute infarction, hemorrhage, hydrocephalus, extra-axial collection or mass lesion/mass effect. Vascular: No hyperdense vessel or unexpected calcification. Skull: Normal. Negative for fracture or focal lesion. Sinuses/Orbits: No acute finding. Other: None. IMPRESSION: 1. No acute intracranial abnormalities. 2. Mild cerebral atrophy with mild chronic microvascular ischemic changes in the cerebral white matter, as above. Electronically Signed   By: Vinnie Langton M.D.   On: 12/25/2021 08:47  ? ?MR ANGIO HEAD WO CONTRAST ? ?Result Date: 12/25/2021 ?CLINICAL DATA:  Neuro deficit with acute stroke suspected EXAM: MRI HEAD WITHOUT CONTRAST MRA HEAD WITHOUT CONTRAST TECHNIQUE: Multiplanar, multi-echo pulse sequences of the brain and surrounding structures were acquired without  intravenous contrast. Angiographic images of the Circle of Willis were acquired using MRA technique without intravenous contrast. COMPARISON:  Head CT from earlier today. FINDINGS: MRI HEAD FINDINGS Brain: 7 mm area of rest

## 2021-12-25 NOTE — ED Notes (Signed)
MD messaged to inform of pt passing stroke swallow screen.  ?

## 2021-12-25 NOTE — ED Triage Notes (Signed)
Patient to ER via POV with complaints of left sided weakness that started two days ago.  ? ?Reports that he feels a lack of sensation on the entire left side of his body, describes feeling slow and sluggish on the left side only. No difficulty swallowing or speaking. Denies injury. Reports taking a blood thinner but unsure which one. Intermittent headache. ?

## 2021-12-26 DIAGNOSIS — E785 Hyperlipidemia, unspecified: Secondary | ICD-10-CM | POA: Diagnosis not present

## 2021-12-26 DIAGNOSIS — I10 Essential (primary) hypertension: Secondary | ICD-10-CM | POA: Diagnosis not present

## 2021-12-26 LAB — LIPID PANEL
Cholesterol: 124 mg/dL (ref 0–200)
HDL: 26 mg/dL — ABNORMAL LOW (ref >40)
LDL Cholesterol: 59 mg/dL (ref 0–99)
Total CHOL/HDL Ratio: 4.8 RATIO
Triglycerides: 197 mg/dL — ABNORMAL HIGH (ref <150)
VLDL: 39 mg/dL (ref 0–40)

## 2021-12-26 LAB — GLUCOSE, CAPILLARY
Glucose-Capillary: 240 mg/dL — ABNORMAL HIGH (ref 70–99)
Glucose-Capillary: 337 mg/dL — ABNORMAL HIGH (ref 70–99)

## 2021-12-26 LAB — HIV ANTIBODY (ROUTINE TESTING W REFLEX): HIV Screen 4th Generation wRfx: NONREACTIVE

## 2021-12-26 MED ORDER — CLOPIDOGREL BISULFATE 75 MG PO TABS: 75.0000 mg | ORAL_TABLET | Freq: Every day | ORAL | 3 refills | Status: DC

## 2021-12-26 MED ORDER — LANTUS SOLOSTAR 100 UNIT/ML ~~LOC~~ SOPN
50.0000 [IU] | PEN_INJECTOR | Freq: Every day | SUBCUTANEOUS | 1 refills | Status: DC | PRN
Start: 2021-12-26 — End: 2022-05-10

## 2021-12-26 MED ORDER — CLOPIDOGREL BISULFATE 75 MG PO TABS
75.0000 mg | ORAL_TABLET | Freq: Every day | ORAL | Status: DC
  Administered 2021-12-26: 75 mg via ORAL
  Filled 2021-12-26: qty 1

## 2021-12-26 NOTE — Progress Notes (Addendum)
Patient being discharged home. IV removed. Went over discharge and medication instructions with patient and patients wife. Both agreed they understood and all questions were answered. Patient refused to be escorted out by wheelchair, he chose to walk to car with his wife. Patient going home POV with wife. ?

## 2021-12-26 NOTE — Evaluation (Signed)
Occupational Therapy Evaluation ?Patient Details ?Name: Scott Doyle ?MRN: [id number] ?DOB: December 29, 1960 ?Today's Date: 12/26/2021 ? ? ?History of Present Illness Scott Doyle is a 61 y.o. male with medical history significant of hypertension, hyperlipidemia, diabetes mellitus, CAD, CABG, stent placement, CHF, bipolar, schizoaffective disorder, anxiety, who presents with left-sided numbness and weakness.  ? ?Clinical Impression ?  ?Scott Doyle presents today with minor numbness, tingling, and weakness in L UE and L LE. Nevertheless, he appears to be at his baseline level of functional mobility, IND in bed mobility, transfers, dressing, toileting, grooming. He is able to ambulate fluidly in room, no AD, no LOB. 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 Provided educ re: signs/symptoms of stroke and importance of immediate medical attention. Pt verbalized understanding. Pt and therapist agreed no additional OT services required at this time, although pt instructed to seek referral for outpt OT if numbness/tingling in L UE does not continue to resolve, or if he notices ongoing challenges in is L-hand piano/organ playing. ?   ? ?Recommendations for follow up therapy are one component of a multi-disciplinary discharge planning process, led by the attending physician.  Recommendations may be updated based on patient status, additional functional criteria and insurance authorization.  ? ?Follow Up Recommendations ? No OT follow up  ?  ?Assistance Recommended at Discharge PRN  ?Patient can return home with the following   ? ?  ?Functional Status Assessment ? Patient has not had a recent decline in their functional status  ?Equipment Recommendations ? None recommended by OT  ?  ?Recommendations for Other Services   ? ? ?  ?Precautions / Restrictions Precautions ?Precautions:  None ?Restrictions ?Weight Bearing Restrictions: No  ? ?  ? ?Mobility Bed Mobility ?Overal bed mobility: Independent ?  ?  ?  ?  ?  ?  ?  ?  ? ?Transfers ?Overall transfer level: Independent ?  ?  ?  ?  ?  ?  ?  ?  ?  ?  ? ?  ?Balance Overall balance assessment: Independent ?  ?  ?  ?  ?  ?  ?  ?  ?  ?  ?  ?  ?  ?  ?  ?  ?  ?  ?   ? ?ADL either performed or assessed with clinical judgement  ? ?ADL Overall ADL's : Independent ?  ?  ?  ?  ?  ?  ?  ?  ?  ?  ?  ?  ?  ?  ?  ?  ?  ?  ?  ?   ? ? ? ?Vision Patient Visual Report: No change from baseline ?   ?   ?Perception   ?  ?Praxis   ?  ? ?Pertinent Vitals/Pain Pain Assessment ?Pain Assessment: No/denies pain  ? ? ? ?Hand Dominance Right ?  ?Extremity/Trunk Assessment Upper Extremity Assessment ?Upper Extremity Assessment: Overall WFL for tasks assessed;LUE deficits/detail ?LUE Deficits / Details: Pt reports minor numbness, particularly in thumb and 2nd and 3rd digits-+ ?  ?Lower Extremity Assessment ?Lower Extremity Assessment: Overall WFL for tasks assessed;LLE deficits/detail ?LLE Deficits / Details: Pt reports minor numbness and tingling ?  ?Cervical / Trunk Assessment ?Cervical / Trunk Assessment: Normal ?  ?Communication Communication ?Communication: No difficulties ?  ?Cognition Arousal/Alertness: Awake/alert ?Behavior During Therapy: [facility] for tasks assessed/performed ?Overall  Cognitive Status: Within Functional Limits for tasks assessed ?  ?  ?  ?  ?  ?  ?  ?  ?  ?  ?  ?  ?  ?  ?  ?  ?General Comments: A&O x 4 ?  ?  ?General Comments    ? ?  ?Exercises Other Exercises ?Other Exercises: Educ re: signs/symptoms of stroke, importance of seeking immediate medical attention ?  ?Shoulder Instructions    ? ? ?Home Living Family/patient expects to be discharged to:: Private residence ?Living Arrangements: Spouse/significant other ?Available Help at Discharge: Family;Available PRN/intermittently ?Type of Home: Mobile home ?Home Access: Stairs to enter ?Entrance  Stairs-Number of Steps: 3 ?  ?Home Layout: One level ?  ?  ?  ?  ?Bathroom Toilet: Standard ?  ?  ?Home Equipment: None ?  ?  ? Lives With: Spouse ? ?  ?Prior Functioning/Environment   ?  ?  ?  ?  ?  ?  ?Mobility Comments: No AD, IND ?ADLs Comments: IND in all ADL/IADL ?  ? ?  ?  ?OT Problem List: Decreased coordination;Impaired UE functional use;Impaired sensation ?  ?   ?OT Treatment/Interventions:    ?  ?OT Goals(Current goals can be found in the care plan section) Acute Rehab OT Goals ?Patient Stated Goal: to get back to work tomorrow ?OT Goal Formulation: With patient ?Time For Goal Achievement: 01/09/22 ?Potential to Achieve Goals: Good  ?OT Frequency:   ?  ? ?Co-evaluation   ?  ?  ?  ?  ? ?  ?AM-PAC OT "6 Clicks" Daily Activity     ?Outcome Measure Help from another person eating meals?: None ?Help from another person taking care of personal grooming?: None ?Help from another person toileting, which includes using toliet, bedpan, or urinal?: None ?Help from another person bathing (including washing, rinsing, drying)?: None ?Help from another person to put on and taking off regular upper body clothing?: None ?Help from another person to put on and taking off regular lower body clothing?: None ?6 Click Score: 24 ?  ?End of Session   ? ?Activity Tolerance: Patient tolerated treatment well ?Patient left: in bed;with call bell/phone within reach ? ?OT Visit Diagnosis: Other abnormalities of gait and mobility (R26.89);Muscle weakness (generalized) (M62.81)  ?              ?Time: 1017-5102 ?OT Time Calculation (min): 13 min ?Charges:  OT General Charges ?$OT Visit: 1 Visit ?OT Evaluation ?$OT Eval Low Complexity: 1 Low ?OT Treatments ?$Self Care/Home Management : 8-22 mins ?Josiah Lobo, PhD, MS, OTR/L ?12/26/21, 10:17 AM ? ?

## 2021-12-26 NOTE — TOC Transition Note (Addendum)
Transition of Care (TOC) - CM/SW Discharge Note ? ? ?Patient Details  ?Name: Scott Doyle ?MRN: [id number] ?Date of Birth: 05-Nov-1960 ? ?Transition of Care (TOC) CM/SW Contact:  ?Izola Price, RN ?Phone Number: ?12/26/2021, 12:36 PM ? ? ?Clinical Narrative:  4/23: Patient is discharging today to home/self care. OBS status.  No TOC consult or needs identified. Code 44 completed and claim adjusted. Spouse present and will transport to home. Simmie Davies RN CM  ? ? ? ?Final next level of care: Home/Self Care ?Barriers to Discharge: Barriers Resolved ? ? ?Patient Goals and CMS Choice ?  ?  ?Choice offered to / list presented to : NA ? ?Discharge Placement ?  ?           ?  ?  ?  ?  ? ?Discharge Plan and Services ?  ?  ?           ?DME Arranged: N/A ?DME Agency: NA ?  ?  ?  ?  ?[place] Agency: NA ?  ?  ?  ? ?Social Determinants of Health (SDOH) Interventions ?  ? ? ?Readmission Risk Interventions ?   ? View : No data to display.  ?  ?  ?  ? ? ? ? ? ?

## 2021-12-26 NOTE — Discharge Summary (Signed)
?Physician Discharge Summary ?  ?Patient: Scott Doyle MRN: [id number] DOB: Feb 09, 1961  ?Admit date:     12/25/2021  ?Discharge date: 12/26/21  ?Discharge Physician: Scott Doyle  ? ?PCP: Scott Doyle  ? ?Recommendations at discharge:  ? ? Keep log of sugars at home and d/w PCP ? ?Discharge Diagnoses: ?Acute right thalamic infarct ? ?Hospital Course: ? Scott Doyle is a 61 y.o. male with medical history significant of hypertension, hyperlipidemia, diabetes mellitus, CAD, CABG, stent placement, CHF, bipolar, schizoaffective disorder, anxiety, who presents with left-sided numbness and weakness. ? Patient states left-sided numbness, tingling and weakness for more than 2 days. ? ?MRI/MRA: ?1. Acute lacunar infarct in the right thalamus. ?2. Bilateral high-grade PCA stenosis. The right PCA is fetal type with narrowing at the P2 segment. ?  ?Acute CVA-- right thalamic infarct ?-- risk factors diabetes, hypertension, hyperlipidemia ?-- came in with left sided numbness ?-- seen by neurology Dr.\Scott Doyle-- recommends add Plavix indefinitely along with aspirin ?-- continue statins ?-- PT OT-- no PT needs ? ?Type II diabetes with renal manifestations ?-- A1c 8.7 ?-- continue home meds--metformin, Jardiance, glipizide and Lantus 50 units as needed ?-- carb control diet ? ?Hypertension ?-- continue Quinapril and atenolol ? ?Hyperlipidemia ?-- continues zetia and Lipitor ? ?overall hemodynamically stable. Patient will discharged to home. Agreement of plan ? ?  ? ? ?Consultants: neurology Dr. Cheral Doyle ? ?Disposition: Home ?Diet recommendation:  ?Discharge Diet Orders (From admission, onward)  ? ?  Start     Ordered  ? 12/26/21 0000  Diet - low sodium heart healthy       ? 12/26/21 1124  ? ?  ?  ? ?  ? ?Cardiac and Carb modified diet ?DISCHARGE MEDICATION: ?Allergies as of 12/26/2021   ? ?   Reactions  ? Ampicillin Hives  ? Penicillins Hives, Swelling  ? ?  ? ?  ?Medication List  ?  ? ?STOP taking these medications    ? ?CANNABIDIOL PO ?  ?traZODone 50 MG tablet ?Commonly known as: DESYREL ?  ? ?  ? ?TAKE these medications   ? ?Accu-Chek Aviva Plus test strip ?Generic drug: glucose blood ?USE ONE STRIP DAILY AS DIRECTED ?  ?aspirin 81 MG tablet ?Take 81 mg by mouth daily. ?  ?atenolol 25 MG tablet ?Commonly known as: TENORMIN ?Take 1 tablet (25 mg total) by mouth daily. ?  ?atorvastatin 40 MG tablet ?Commonly known as: LIPITOR ?Take 1 tablet (40 mg total) by mouth daily. ?  ?clopidogrel 75 MG tablet ?Commonly known as: PLAVIX ?Take 1 tablet (75 mg total) by mouth daily. ?Start taking on: December 27, 2021 ?  ?empagliflozin 10 MG Tabs tablet ?Commonly known as: Jardiance ?Take 1 tablet (10 mg total) by mouth daily before breakfast. ?  ?ezetimibe 10 MG tablet ?Commonly known as: ZETIA ?Take 1 tablet (10 mg total) by mouth daily. ?  ?glipiZIDE 2.5 MG 24 hr tablet ?Commonly known as: GLUCOTROL XL ?Take 1 tablet (2.5 mg total) by mouth daily. ?  ?isosorbide mononitrate 120 MG 24 hr tablet ?Commonly known as: IMDUR ?Take 1 tablet (120 mg total) by mouth daily. ?  ?Lantus SoloStar 100 UNIT/ML Solostar Pen ?Generic drug: insulin glargine ?Inject 50 Units into the skin daily as needed (blood glucose >150). ?  ?metFORMIN 1000 MG tablet ?Commonly known as: GLUCOPHAGE ?TAKE 1 TABLET BY MOUTH TWICE A DAY ?  ?nitroGLYCERIN 0.4 MG SL tablet ?Commonly known as: NITROSTAT ?Place 1 tablet (0.4 mg total) under the tongue  every 5 (five) minutes as needed. Maximum of 3 doses. ?  ?omeprazole 20 MG capsule ?Commonly known as: PRILOSEC ?Take 1 capsule (20 mg total) by mouth daily. ?  ?Pen Needles 33G X 4 MM Misc ?Use to administer insulin daily ?  ?quinapril 40 MG tablet ?Commonly known as: ACCUPRIL ?Take 1 tablet (40 mg total) by mouth daily. ?  ?torsemide 10 MG tablet ?Commonly known as: DEMADEX ?Take 1 tablet (10 mg total) by mouth daily as needed (As needed for swelling, shortness of breath, or weight gain.). ?  ? ?  ? ? Follow-up Information   ? ?  Scott Doyle. Schedule an appointment as soon as possible for a visit in 1 week(s).   ?Specialties: Internal Medicine, Cardiology ?Why: pt to call and make appt for cva f/u ?Contact information: ?[place] ?[place] [address] ?[phone] ? ? ?  ?  ? ? Scott Hampshire, Doyle .   ?Specialty: Cardiology ?Contact information: ?[address] ?[address] ?[place] [address] ?[phone] ? ? ?  ?  ? ?  ?  ? ?  ? ?Discharge Exam: ?Scott Doyle Weights  ? 12/25/21 0802  ?Weight: 74.9 kg  ? ? ? ?Condition at discharge: fair ? ?The results of significant diagnostics from this hospitalization (including imaging, microbiology, ancillary and laboratory) are listed below for reference.  ? ?Imaging Studies: ?CT HEAD WO CONTRAST ? ?Result Date: 12/25/2021 ?CLINICAL DATA:  61 year old male with history of acute onset of left-sided numbness and weakness. Suspected stroke. EXAM: CT HEAD WITHOUT CONTRAST TECHNIQUE: Contiguous axial images were obtained from the base of the skull through the vertex without intravenous contrast. RADIATION DOSE REDUCTION: This exam was performed according to the departmental dose-optimization program which includes automated exposure control, adjustment of the mA and/or kV according to patient size and/or use of iterative reconstruction technique. COMPARISON:  Head CT 01/02/2018. FINDINGS: Brain: Mild cerebral atrophy. Patchy areas of mild decreased attenuation are noted throughout the deep and periventricular white matter of the cerebral hemispheres bilaterally, compatible with mild chronic microvascular ischemic disease. No evidence of acute infarction, hemorrhage, hydrocephalus, extra-axial collection or mass lesion/mass effect. Vascular: No hyperdense vessel or unexpected calcification. Skull: Normal. Negative for fracture or focal lesion. Sinuses/Orbits: No acute finding. Other: None. IMPRESSION: 1. No acute intracranial abnormalities. 2. Mild cerebral atrophy with mild chronic  microvascular ischemic changes in the cerebral white matter, as above. Electronically Signed   By: Scott Doyle M.D.   On: 12/25/2021 08:47  ? ?MR ANGIO HEAD WO CONTRAST ? ?Result Date: 12/25/2021 ?CLINICAL DATA:  Neuro deficit with acute stroke suspected EXAM: MRI HEAD WITHOUT CONTRAST MRA HEAD WITHOUT CONTRAST TECHNIQUE: Multiplanar, multi-echo pulse sequences of the brain and surrounding structures were acquired without intravenous contrast. Angiographic images of the Circle of Willis were acquired using MRA technique without intravenous contrast. COMPARISON:  Head CT from earlier today. FINDINGS: MRI HEAD FINDINGS Brain: 7 mm area of restricted diffusion in the lateral right thalamus. No hemorrhage, hydrocephalus, or mass. Cerebral volume loss which is generalized. No chronic hemorrhagic injury. Mild chronic small vessel ischemia in the hemispheric white matter. Vascular: Normal flow voids. Skull and upper cervical spine: Normal marrow signal. Sinuses/Orbits: No acute or significant finding. MRA HEAD FINDINGS Carotid, vertebral, and basilar arteries are widely patent. Fetal type right PCA. High-grade left PCA bifurcation and right P2 segment stenoses with flow gap. No anterior circulation branch occlusion or flow limiting stenosis. No evidence of aneurysm. IMPRESSION: 1. Acute lacunar infarct in the  right thalamus. 2. Bilateral high-grade PCA stenosis. The right PCA is fetal type with narrowing at the P2 segment. Electronically Signed   By: Jorje Guild M.D.   On: 12/25/2021 09:55  ? ?MR BRAIN WO CONTRAST ? ?Result Date: 12/25/2021 ?CLINICAL DATA:  Neuro deficit with acute stroke suspected EXAM: MRI HEAD WITHOUT CONTRAST MRA HEAD WITHOUT CONTRAST TECHNIQUE: Multiplanar, multi-echo pulse sequences of the brain and surrounding structures were acquired without intravenous contrast. Angiographic images of the Circle of Willis were acquired using MRA technique without intravenous contrast. COMPARISON:  Head CT  from earlier today. FINDINGS: MRI HEAD FINDINGS Brain: 7 mm area of restricted diffusion in the lateral right thalamus. No hemorrhage, hydrocephalus, or mass. Cerebral volume loss which is generalized. No chronic hemorrhagic in

## 2021-12-26 NOTE — Care Management CC44 (Signed)
Condition Code 44 Documentation Completed ? ?Patient Details  ?Name: Scott Doyle ?MRN: [id number] ?Date of Birth: 1961/04/15 ? ? ?Condition Code 44 given:  Yes ?Patient signature on Condition Code 44 notice:  Yes ?Documentation of 2 MD's agreement:  Yes ?Code 44 added to claim:  Yes ? ? ? ?Izola Price, RN ?12/26/2021, 12:34 PM ? ?

## 2021-12-26 NOTE — Evaluation (Signed)
Physical Therapy Evaluation ?Patient Details ?Name: Scott Doyle ?MRN: [id number] ?DOB: 1960/12/05 ?Today's Date: 12/26/2021 ? ?History of Present Illness ? Pt is a 61 y/o M admitted on 12/25/21 after presenting with c/o L sided numbness & weakness for more than 2 days. MRI reveals acute lacunar infarct in the right thalamus with bilateral high-grade PCA stenosis. PMH: HTN, HLD, DM, CAD, CABG, stent placement, CHF, bipolar, schizoaffective disorder, anxiety  ?Clinical Impression ? Pt seen for PT evaluation with pt reporting he cares for young grandchildren every day & is independent with all aspects of mobility. On this date, pt is able to ambulate increased distances & negotiate stairs with independence. Pt does move with increased speed with PT educating pt to slow down slightly to increase safety. At this time pt does not need acute PT services nor f/u therapy. PT to sign off. Please re-consult if new needs arise.   ?   ? ?Recommendations for follow up therapy are one component of a multi-disciplinary discharge planning process, led by the attending physician.  Recommendations may be updated based on patient status, additional functional criteria and insurance authorization. ? ?Follow Up Recommendations No PT follow up ? ?  ?Assistance Recommended at Discharge PRN  ?Patient can return home with the following ?   ? ?  ?Equipment Recommendations    ?Recommendations for Other Services ?    ?  ?Functional Status Assessment Patient has not had a recent decline in their functional status  ? ?  ?Precautions / Restrictions Precautions ?Precautions: None ?Restrictions ?Weight Bearing Restrictions: No  ? ?  ? ?Mobility ? Bed Mobility ?Overal bed mobility: Independent ?  ?  ?  ?  ?  ?  ?  ?  ? ?Transfers ?Overall transfer level: Independent ?Equipment used: None ?  ?  ?  ?  ?  ?  ?  ?General transfer comment: STS independently ?  ? ?Ambulation/Gait ?Ambulation/Gait assistance: Independent ?Gait Distance (Feet): 300  Feet ?Assistive device: None ?Gait Pattern/deviations: Step-through pattern ?Gait velocity: Increased, cuing to decrease for increased safety ?  ?  ?  ? ?Stairs ?Stairs: Yes ?Stairs assistance: Modified independent (Device/Increase time) ?Stair Management: One rail Right, Alternating pattern ?Number of Stairs: 6 ?General stair comments: Cuing to decrease speed for increased safety ? ?Wheelchair Mobility ?  ? ?Modified Rankin (Stroke Patients Only) ?  ? ?  ? ?Balance Overall balance assessment: Independent ?  ?  ?  ?  ?  ?  ?  ?  ?  ?  ?  ?  ?  ?  ?  ?  ?  ?  ?   ? ? ? ?Pertinent Vitals/Pain Pain Assessment ?Pain Assessment: No/denies pain  ? ? ?Home Living Family/patient expects to be discharged to:: Private residence ?Living Arrangements: Spouse/significant other ?Available Help at Discharge: Family;Available PRN/intermittently ?Type of Home: Mobile home ?Home Access: Stairs to enter ?Entrance Stairs-Rails: Left;Right (may be close enough to reach both at once) ?Entrance Stairs-Number of Steps: 3 at front, 7 at back ?  ?Home Layout: One level ?Home Equipment: None ?   ?  ?Prior Function Prior Level of Function : Independent/Modified Independent ?  ?  ?  ?  ?  ?  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  ? ? ?Hand Dominance  ? Dominant Hand: Right ? ?  ?Extremity/Trunk Assessment  ? Upper Extremity Assessment ?Upper  Extremity Assessment: Overall WFL for tasks assessed ?LUE Deficits / Details: endorses numbness in digits 1-4 on L hand ?  ? ?Lower Extremity Assessment ?Lower Extremity Assessment: Overall WFL for tasks assessed;LLE deficits/detail ?LLE Deficits / Details: Endorses numbness in toes but proprioception & light touch intact LLE. Strength WNL, not formally tested. ?  ? ?Cervical / Trunk Assessment ?Cervical / Trunk Assessment: Normal  ?Communication  ? Communication: No difficulties  ?Cognition  Arousal/Alertness: Awake/alert ?Behavior During Therapy: [facility] for tasks assessed/performed, Impulsive ?Overall Cognitive Status: Within Functional Limits for tasks assessed ?  ?  ?  ?  ?  ?  ?  ?  ?  ?  ?  ?  ?  ?  ?  ?  ?General Comments: A&O x 4 ?  ?  ? ?  ?General Comments General comments (skin integrity, edema, etc.): Educated pt on stroke recovery & signs/symptoms of another stroke, as well as need to get checked out by medical professional if he experiences another sudden significant change in the future, education to eat healthy, exercise & take meds as prescribed. ? ?  ?Exercises    ? ?Assessment/Plan  ?  ?PT Assessment Patient does not need any further PT services  ?PT Problem List   ? ?   ?  ?PT Treatment Interventions     ? ?PT Goals (Current goals can be found in the Care Plan section)  ?Acute Rehab PT Goals ?Patient Stated Goal: go home ?PT Goal Formulation: With patient ?Time For Goal Achievement: 01/09/22 ?Potential to Achieve Goals: Good ? ?  ?Frequency   ?  ? ? ?Co-evaluation   ?  ?  ?  ?  ? ? ?  ?AM-PAC PT "6 Clicks" Mobility  ?Outcome Measure Help needed turning from your back to your side while in a flat bed without using bedrails?: None ?Help needed moving from lying on your back to sitting on the side of a flat bed without using bedrails?: None ?Help needed moving to and from a bed to a chair (including a wheelchair)?: None ?Help needed standing up from a chair using your arms (e.g., wheelchair or bedside chair)?: None ?Help needed to walk in hospital room?: None ?Help needed climbing 3-5 steps with a railing? : None ?6 Click Score: 24 ? ?  ?End of Session   ?Activity Tolerance: Patient tolerated treatment well ?Patient left:  (standing in room) ?Nurse Communication: Mobility status ?  ?  ? ?Time: 1245-8099 ?PT Time Calculation (min) (ACUTE ONLY): 13 min ? ? ?Charges:   PT Evaluation ?$PT Eval Low Complexity: 1 Low ?  ?  ?   ? ? ?Lavone Nian, PT, DPT ?12/26/21, 10:37 AM ? ? ?Waunita Schooner ?12/26/2021, 10:36 AM ? ?

## 2021-12-26 NOTE — Evaluation (Signed)
Speech Language Pathology Evaluation ?Patient Details ?Name: Scott Doyle ?MRN: [id number] ?DOB: 04-01-1961 ?Today's Date: 12/26/2021 ?Time: 1694-5038 ?SLP Time Calculation (min) (ACUTE ONLY): 15 min ? ?Problem List:  ?Patient Active Problem List  ? Diagnosis Date Noted  ? Stroke Scott Doyle) 12/25/2021  ? Chronic diastolic CHF (congestive heart failure) ([place]) 12/25/2021  ? Type II diabetes mellitus with renal manifestations ([place]) 12/25/2021  ? Sore throat 04/13/2021  ? Mixed hyperlipidemia 06/16/2020  ? Essential hypertension 06/16/2020  ? Diabetes 1.5, managed as type 2 ([place]) 06/16/2020  ? Need for influenza vaccination 06/16/2020  ? Skin cancer of anterior chest 06/16/2020  ? Sinusitis 05/17/2020  ? Bipolar affective disorder ([place])   ? Schizoaffective disorder ([place]) 12/31/2017  ? Anxiety   ? Coronary artery disease   ? Hyperlipidemia   ? Hypertension   ? ?Past Medical History:  ?Past Medical History:  ?Diagnosis Date  ? Anxiety   ? Coronary artery disease   ? a. s/p prior CABG x 1 (LIMA->LAD) and PCI to the D2; b. 01/2012 PCI: patent prox Diag stent w/ sev distal dzs->DES x 1; c. 08/2014 Cath: LM nl, LAD 73m D2 patent stents w/ 356mLCX nl, RCA 40p, LIMA->LAD nl, EF 60%.  ? Diabetes mellitus   ? Hyperlipidemia   ? Hypertension   ? Thrombocytopathia ([place]  ? ?Past Surgical History:  ?Past Surgical History:  ?Procedure Laterality Date  ? CARDIAC CATHETERIZATION    ? CARDIAC CATHETERIZATION  08/2014  ? Patent LIMA to LAD and patent diagonal stents.  ? CORONARY ARTERY BYPASS GRAFT  2001  ? [place]? ?HPI:  ?Per Ph90&P "SiWHITMAN MEINHARDTs a [age].o. male with medical history significant of hypertension, hyperlipidemia, diabetes mellitus, CAD, CABG, stent placement, CHF, bipolar, schizoaffective disorder, anxiety, who presents with left-sided numbness and weakness.     Patient states left-sided numbness, tingling and weakness for more than 2 days (patient cannot tell me exactly how many days he has been having the  symptoms).  He states that his weakness has improved, but still has tingling and numbness in the left side from left face to leg.  No difficulty swallowing.  No vision loss or hearing loss.  Patient denies chest pain, cough, shortness of breath.  No fever or chills.  Denies nausea, vomiting, diarrhea or abdominal pain.  No symptoms of UTI.        Data Reviewed and ED Course: pt was found to have WBC 7.9, INR 0.9, PTT 31, GFR> 60, temperature normal, blood pressure 191/81, 165/80, heart rate 59, RR 14, oxygen saturation 98% on room air.  CT of head is negative.  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  Patient is admitted to telemetry bed as inpatient.  Dr. LiCheral Markerf neurology is consulted     MRI/MRA:  1. Acute lacunar infarct in the right thalamus.  2. Bilateral high-grade PCA stenosis. The right PCA is fetal type with narrowing at the P2 segment."  ? ?Assessment / Plan / Recommendation ?Clinical Impression ? Pt seen for cognitive-linguistic evaluation. Pt alert, pleasant, and cooperative. Eager to d/c home. Denies changes to speech, language, cognition or swallow function.  ? ?Assessment completed via informal means and portions of Cognistat. Pt A&Ox4. Speech is fluent, appropriate, and without s/sx dysarthria or anomia. Pt demonstrated intact basic functional receptive/expressive language and cognitive-linguistic ability.  ? ?SLP to sign off as pt has no acute SLP needs at this time.  ? ?Pt and RN  made aware of results, recommendations, and SLP POC. Pt verbalized understanding/agreement.  ?   ?SLP Assessment ? SLP Recommendation/Assessment: Patient does not need any further [facility] ?SLP Visit Diagnosis: Cognitive communication deficit (R41.841)  ?  ?Recommendations for follow up therapy are one component of a multi-disciplinary discharge planning process, led by the attending physician.  Recommendations may be updated based on patient status,  additional functional criteria and insurance authorization. ?   ?Follow Up Recommendations ? No SLP follow up  ?  ?Assistance Recommended at Discharge ?  (defer to OT/PT)  ?Functional Status Assessment Patient has not had a recent decline in their functional status  ?   ?SLP Evaluation ?Cognition ? Overall Cognitive Status: Within Functional Limits for tasks assessed ?Arousal/Alertness: Awake/alert ?Orientation Level: Oriented X4 ?Year: 2023 ?Month: April ?Day of Week: Correct ?Attention:  [facility]) ?Memory: Appears intact ?Awareness: Appears intact ?Problem Solving: Appears intact ?Executive Function:  [facility]) ?Safety/Judgment: Appears intact  ?  ?   ?Comprehension ? Auditory Comprehension ?Overall Auditory Comprehension: Appears within functional limits for tasks assessed ?Yes/No Questions: Within Functional Limits ?Commands: Within Functional Limits ?Conversation: Complex [facility] Ar[facility] Doyle) ?Visual Recognition/Discrimination ?Discrimination: Not tested ?Reading Comprehension ?Reading Status: Not tested  ?  ?Expression Expression ?Primary Mode of Expression: Verbal ?Verbal Expression ?Overall Verbal Expression: Appears within functional limits for tasks assessed ?Initiation: No impairment ?Automatic Speech:  [facility]) ?Repetition: No impairment ?Naming: No impairment ?Pragmatics: No impairment ?Written Expression ?Written Expression: Not tested   ?Oral / Motor ? Oral Motor/Sensory Function ?Overall Oral Motor/Sensory Function: Within functional limits ?Motor Speech ?Overall Motor Speech: Appears within functional limits for tasks assessed ?Respiration: Within functional limits ?Phonation: Normal ?Resonance: Within functional limits ?Articulation: Within functional limitis ?Intelligibility: Intelligible ?Motor Planning: Witnin functional limits ?Motor Speech Errors: Not applicable   ?        ?Cherrie Gauze, M.S., CCC-SLP ?Speech-Language Pathologist ?[facility] ?[phone] ([place])  ? ?Quintella Baton ?12/26/2021, 9:45 AM ? ?

## 2021-12-28 ENCOUNTER — Ambulatory Visit (INDEPENDENT_AMBULATORY_CARE_PROVIDER_SITE_OTHER): Payer: Medicare HMO | Admitting: Internal Medicine

## 2021-12-28 ENCOUNTER — Encounter: Payer: Self-pay | Admitting: Internal Medicine

## 2021-12-28 VITALS — BP 139/71 | HR 68 | Ht 66.0 in | Wt 166.1 lb

## 2021-12-28 DIAGNOSIS — E1122 Type 2 diabetes mellitus with diabetic chronic kidney disease: Secondary | ICD-10-CM | POA: Diagnosis not present

## 2021-12-28 DIAGNOSIS — I6381 Other cerebral infarction due to occlusion or stenosis of small artery: Secondary | ICD-10-CM | POA: Diagnosis not present

## 2021-12-28 DIAGNOSIS — Z794 Long term (current) use of insulin: Secondary | ICD-10-CM | POA: Diagnosis not present

## 2021-12-28 DIAGNOSIS — E782 Mixed hyperlipidemia: Secondary | ICD-10-CM | POA: Diagnosis not present

## 2021-12-28 DIAGNOSIS — N182 Chronic kidney disease, stage 2 (mild): Secondary | ICD-10-CM | POA: Diagnosis not present

## 2021-12-28 DIAGNOSIS — F419 Anxiety disorder, unspecified: Secondary | ICD-10-CM | POA: Diagnosis not present

## 2021-12-28 DIAGNOSIS — I10 Essential (primary) hypertension: Secondary | ICD-10-CM

## 2021-12-28 DIAGNOSIS — F3113 Bipolar disorder, current episode manic without psychotic features, severe: Secondary | ICD-10-CM

## 2021-12-28 DIAGNOSIS — E119 Type 2 diabetes mellitus without complications: Secondary | ICD-10-CM | POA: Diagnosis not present

## 2021-12-28 LAB — GLUCOSE, POCT (MANUAL RESULT ENTRY): POC Glucose: 150 mg/dl — AB (ref 70–99)

## 2021-12-28 NOTE — Assessment & Plan Note (Signed)
Patient is doing well he has good strength in the left arm and forearm he can walk without any problem heart is regular chest is clear.  Abdomen is soft nontender without any hepatosplenomegaly. ?There is no pedal edema no calf tenderness. ?

## 2021-12-28 NOTE — Assessment & Plan Note (Signed)

## 2021-12-28 NOTE — Assessment & Plan Note (Signed)
Hypercholesterolemia  I advised the patient to follow Mediterranean diet This diet is rich in fruits vegetables and whole grain, and This diet is also rich in fish and lean meat Patient should also eat a handful of almonds or walnuts daily Recent heart study indicated that average follow-up on this kind of diet reduces the cardiovascular mortality by 50 to 70%== 

## 2021-12-28 NOTE — Assessment & Plan Note (Signed)
Stable at the present time. 

## 2021-12-28 NOTE — Progress Notes (Signed)
? ?Established Patient Office Visit ? ?Subjective:  ?Patient ID: Scott Doyle, male    DOB: 09/23/1960  Age: 61 y.o. MRN: [id number] ? ?CC:  ?Chief Complaint  ?Patient presents with  ? Hospitalization Follow-up  ?  Patient was admitted on 12/25/21 and discharge on 12/26/21. Patient was admitted for minor stroke.   ? ? ?HPI ? ?Scott Doyle presents for  ?Past Medical History:  ?Diagnosis Date  ? Anxiety   ? Coronary artery disease   ? a. s/p prior CABG x 1 (LIMA->LAD) and PCI to the D2; b. 01/2012 PCI: patent prox Diag stent w/ sev distal dzs->DES x 1; c. 08/2014 Cath: LM nl, LAD 16m D2 patent stents w/ 367mLCX nl, RCA 40p, LIMA->LAD nl, EF 60%.  ? Diabetes mellitus   ? Hyperlipidemia   ? Hypertension   ? [place]opathia ([place]  ? ? ?Past Surgical History:  ?Procedure Laterality Date  ? CARDIAC CATHETERIZATION    ? CARDIAC CATHETERIZATION  08/2014  ? Patent LIMA to LAD and patent diagonal stents.  ? CORONARY ARTERY BYPASS GRAFT  2001  ? [place]? ? ?Family History  ?Problem Relation Age of Onset  ? Hypertension Mother   ? Heart disease Mother   ? Diabetes Other   ?     fx  ? Cancer Other   ?     mat. family  ? ? ?Social History  ? ?Socioeconomic History  ? Marital status: Married  ?  Spouse name: Scott Doyle? Number of children: Not on file  ? Years of education: Not on file  ? Highest education level: GED or equivalent  ?Occupational History  ? Not on file  ?Tobacco Use  ? Smoking status: Former  ?  Types: Cigarettes  ?  Quit date: 2001  ?  Years since quitting: 22.3  ? Smokeless tobacco: Never  ?Vaping Use  ? Vaping Use: Never used  ?Substance and Sexual Activity  ? Alcohol use: Yes  ?  Comment: 1-2 glasses of wine occassionally  ? Drug use: No  ? Sexual activity: Not Currently  ?Other Topics Concern  ? Not on file  ?Social History Narrative  ? Not on file  ? ?Social Determinants of Health  ? ?Financial Resource Strain: Low Risk   ? Difficulty of Paying Living Expenses: Not very hard  ?Food Insecurity: No Food  Insecurity  ? Worried About RuCharity fundraisern the Last Year: Never true  ? Ran Out of Food in the Last Year: Never true  ?Transportation Needs: No Transportation Needs  ? Lack of Transportation (Medical): No  ? Lack of Transportation (Non-Medical): No  ?Physical Activity: Sufficiently Active  ? Days of Exercise per Week: 5 days  ? Minutes of Exercise per Session: 30 min  ?Stress: No Stress Concern Present  ? Feeling of Stress : Not at all  ?Social Connections: Socially Integrated  ? Frequency of Communication with Friends and Family: More than three times a week  ? Frequency of Social Gatherings with Friends and Family: More than three times a week  ? Attends Religious Services: More than 4 times per year  ? Active Member of Clubs or Organizations: Yes  ? Attends ClArchivisteetings: More than 4 times per year  ? Marital Status: Married  ?Intimate Partner Violence: Not At Risk  ? Fear of Current or Ex-Partner: No  ? Emotionally Abused: No  ? Physically Abused: No  ? Sexually Abused: No  ? ? ? ?  Current Outpatient Medications:  ?  ACCU-CHEK AVIVA PLUS test strip, USE ONE STRIP DAILY AS DIRECTED, Disp: 100 strip, Rfl: 2 ?  aspirin 81 MG tablet, Take 81 mg by mouth daily., Disp: , Rfl:  ?  atenolol (TENORMIN) 25 MG tablet, Take 1 tablet (25 mg total) by mouth daily., Disp: 90 tablet, Rfl: 3 ?  atorvastatin (LIPITOR) 40 MG tablet, Take 1 tablet (40 mg total) by mouth daily., Disp: 90 tablet, Rfl: 3 ?  clopidogrel (PLAVIX) 75 MG tablet, Take 1 tablet (75 mg total) by mouth daily., Disp: 30 tablet, Rfl: 3 ?  empagliflozin (JARDIANCE) 10 MG TABS tablet, Take 1 tablet (10 mg total) by mouth daily before breakfast., Disp: 30 tablet, Rfl: 6 ?  ezetimibe (ZETIA) 10 MG tablet, Take 1 tablet (10 mg total) by mouth daily., Disp: 90 tablet, Rfl: 3 ?  glipiZIDE (GLUCOTROL XL) 2.5 MG 24 hr tablet, Take 1 tablet (2.5 mg total) by mouth daily., Disp: 90 tablet, Rfl: 3 ?  Insulin Pen Needle (PEN NEEDLES) 33G X 4 MM MISC,  Use to administer insulin daily, Disp: 30 each, Rfl: 6 ?  isosorbide mononitrate (IMDUR) 120 MG 24 hr tablet, Take 1 tablet (120 mg total) by mouth daily., Disp: 90 tablet, Rfl: 3 ?  LANTUS SOLOSTAR 100 UNIT/ML Solostar Pen, Inject 50 Units into the skin daily as needed (blood glucose >150)., Disp: 15 mL, Rfl: 1 ?  metFORMIN (GLUCOPHAGE) 1000 MG tablet, TAKE 1 TABLET BY MOUTH TWICE A DAY, Disp: 180 tablet, Rfl: 4 ?  nitroGLYCERIN (NITROSTAT) 0.4 MG SL tablet, Place 1 tablet (0.4 mg total) under the tongue every 5 (five) minutes as needed. Maximum of 3 doses., Disp: 25 tablet, Rfl: 3 ?  omeprazole (PRILOSEC) 20 MG capsule, Take 1 capsule (20 mg total) by mouth daily., Disp: 90 capsule, Rfl: 3 ?  quinapril (ACCUPRIL) 40 MG tablet, Take 1 tablet (40 mg total) by mouth daily., Disp: 90 tablet, Rfl: 5 ?  torsemide (DEMADEX) 10 MG tablet, Take 1 tablet (10 mg total) by mouth daily as needed (As needed for swelling, shortness of breath, or weight gain.)., Disp: 90 tablet, Rfl: 0  ? ?Allergies  ?Allergen Reactions  ? Ampicillin Hives  ? Penicillins Hives and Swelling  ? ? ?ROS ?Review of Systems  ?Constitutional: Negative.   ?HENT: Negative.    ?Eyes: Negative.   ?Respiratory: Negative.    ?Cardiovascular: Negative.   ?Gastrointestinal: Negative.   ?Endocrine: Negative.   ?Genitourinary: Negative.   ?Musculoskeletal: Negative.   ?Skin: Negative.   ?Allergic/Immunologic: Negative.   ?Neurological: Negative.  Negative for seizures, syncope and facial asymmetry.  ?Hematological: Negative.   ?Psychiatric/Behavioral:  Positive for sleep disturbance. Negative for confusion, dysphoric mood and hallucinations. The patient is not hyperactive.   ?All other systems reviewed and are negative. ? ?  ?Objective:  ?  ?Physical Exam ?Vitals reviewed.  ?Constitutional:   ?   Appearance: Normal appearance.  ?HENT:  ?   Mouth/Throat:  ?   Mouth: Mucous membranes are moist.  ?Eyes:  ?   Pupils: Pupils are equal, round, and reactive to light.   ?Neck:  ?   Vascular: No carotid bruit.  ?Cardiovascular:  ?   Rate and Rhythm: Normal rate and regular rhythm.  ?   Pulses: Normal pulses.  ?   Heart sounds: Normal heart sounds.  ?Pulmonary:  ?   Effort: Pulmonary effort is normal.  ?   Breath sounds: Normal breath sounds.  ?Abdominal:  ?  General: Bowel sounds are normal.  ?   Palpations: Abdomen is soft. There is no hepatomegaly, splenomegaly or mass.  ?   Tenderness: There is no abdominal tenderness.  ?   Hernia: No hernia is present.  ?Musculoskeletal:  ?   Cervical back: Neck supple.  ?   Right lower leg: No edema.  ?   Left lower leg: No edema.  ?Skin: ?   Findings: No rash.  ?Neurological:  ?   Mental Status: He is alert and oriented to person, place, and time.  ?   Motor: No weakness.  ?Psychiatric:     ?   Mood and Affect: Mood normal.     ?   Behavior: Behavior normal.  ? ? ?BP 139/71   Pulse 68   Ht '5\' 6"'  (1.676 m)   Wt 166 lb 1.6 oz (75.3 kg)   BMI 26.81 kg/m?  ?Wt Readings from Last 3 Encounters:  ?12/28/21 166 lb 1.6 oz (75.3 kg)  ?12/25/21 165 lb 3.2 oz (74.9 kg)  ?10/21/21 160 lb 2 oz (72.6 kg)  ? ? ? ?Health Maintenance Due  ?Topic Date Due  ? FOOT EXAM  Never done  ? Hepatitis C Screening  Never done  ? Zoster Vaccines- Shingrix (1 of 2) Never done  ? COLONOSCOPY (Pts 45-58yr Insurance coverage will need to be confirmed)  Never done  ? TETANUS/TDAP  06/05/2019  ? COVID-19 Vaccine (4 - Booster for Moderna series) 09/10/2020  ? ? ?There are no preventive care reminders to display for this patient. ? ?Lab Results  ?Component Value Date  ? TSH 0.958 09/14/2016  ? ?Lab Results  ?Component Value Date  ? WBC 7.9 12/25/2021  ? HGB 16.2 12/25/2021  ? HCT 48.5 12/25/2021  ? MCV 88.2 12/25/2021  ? PLT 183 12/25/2021  ? ?Lab Results  ?Component Value Date  ? NA 138 12/25/2021  ? K 4.6 12/25/2021  ? CO2 21 (L) 12/25/2021  ? GLUCOSE 234 (H) 12/25/2021  ? BUN 15 12/25/2021  ? CREATININE 0.88 12/25/2021  ? BILITOT 0.8 12/25/2021  ? ALKPHOS 60 12/25/2021   ? AST 32 12/25/2021  ? ALT 27 12/25/2021  ? PROT 7.7 12/25/2021  ? ALBUMIN 4.1 12/25/2021  ? CALCIUM 9.2 12/25/2021  ? ANIONGAP 13 12/25/2021  ? EGFR 73 08/03/2021  ? ?Lab Results  ?Component Value Date  ? C

## 2021-12-28 NOTE — Assessment & Plan Note (Signed)

## 2022-01-04 ENCOUNTER — Ambulatory Visit: Payer: Medicare HMO | Admitting: Cardiovascular Disease

## 2022-01-11 ENCOUNTER — Ambulatory Visit (INDEPENDENT_AMBULATORY_CARE_PROVIDER_SITE_OTHER): Payer: Medicare HMO | Admitting: Internal Medicine

## 2022-01-11 ENCOUNTER — Encounter: Payer: Self-pay | Admitting: Internal Medicine

## 2022-01-11 VITALS — BP 144/74 | HR 62 | Ht 66.0 in | Wt 167.1 lb

## 2022-01-11 DIAGNOSIS — E139 Other specified diabetes mellitus without complications: Secondary | ICD-10-CM | POA: Diagnosis not present

## 2022-01-11 DIAGNOSIS — E119 Type 2 diabetes mellitus without complications: Secondary | ICD-10-CM

## 2022-01-11 DIAGNOSIS — F419 Anxiety disorder, unspecified: Secondary | ICD-10-CM

## 2022-01-11 DIAGNOSIS — I10 Essential (primary) hypertension: Secondary | ICD-10-CM | POA: Diagnosis not present

## 2022-01-11 DIAGNOSIS — Z8673 Personal history of transient ischemic attack (TIA), and cerebral infarction without residual deficits: Secondary | ICD-10-CM

## 2022-01-11 DIAGNOSIS — I25118 Atherosclerotic heart disease of native coronary artery with other forms of angina pectoris: Secondary | ICD-10-CM

## 2022-01-11 DIAGNOSIS — I5032 Chronic diastolic (congestive) heart failure: Secondary | ICD-10-CM | POA: Diagnosis not present

## 2022-01-11 DIAGNOSIS — I6381 Other cerebral infarction due to occlusion or stenosis of small artery: Secondary | ICD-10-CM

## 2022-01-11 LAB — GLUCOSE, POCT (MANUAL RESULT ENTRY): POC Glucose: 140 mg/dl — AB (ref 70–99)

## 2022-01-11 NOTE — Assessment & Plan Note (Signed)
Chest is clear heart is regular there is no pedal edema.  Last ejection fraction is 65%. ?

## 2022-01-11 NOTE — Assessment & Plan Note (Signed)
Hypercholesterolemia  I advised the patient to follow Mediterranean diet This diet is rich in fruits vegetables and whole grain, and This diet is also rich in fish and lean meat Patient should also eat a handful of almonds or walnuts daily Recent heart study indicated that average follow-up on this kind of diet reduces the cardiovascular mortality by 50 to 70%== 

## 2022-01-11 NOTE — Progress Notes (Signed)
? ?Established Patient Office Visit ? ?Subjective:  ?Patient ID: Scott Doyle, male    DOB: 14-Jan-1961  Age: 61 y.o. MRN: [id number] ? ?CC:  ?Chief Complaint  ?Patient presents with  ? Follow-up  ?  1 month follow up  ? ? ?HPI ? ?[name] presents for general checkup.  Past medical history surgical history medication list and labs have been reviewed with the patient.  he does not smoke does not drink.Patient has a mini stroke about 3 weeks ago for which she was admitted into the hospital, RT THALAMIC CVA ? ?Past Medical History:  ?Diagnosis Date  ? Anxiety   ? Coronary artery disease   ? a. s/p prior CABG x 1 (LIMA->LAD) and PCI to the D2; b. 01/2012 PCI: patent prox Diag stent w/ sev distal dzs->DES x 1; c. 08/2014 Cath: LM nl, LAD 33m D2 patent stents w/ 337mLCX nl, RCA 40p, LIMA->LAD nl, EF 60%.  ? Diabetes mellitus   ? Hyperlipidemia   ? Hypertension   ? [place]ocytopathia ([place]  ? ? ?Past Surgical History:  ?Procedure Laterality Date  ? CARDIAC CATHETERIZATION    ? CARDIAC CATHETERIZATION  08/2014  ? Patent LIMA to LAD and patent diagonal stents.  ? CORONARY ARTERY BYPASS GRAFT  2001  ? [place]? ? ?Family History  ?Problem Relation Age of Onset  ? Hypertension Mother   ? Heart disease Mother   ? Diabetes Other   ?     fx  ? Cancer Other   ?     mat. family  ? ? ?Social History  ? ?Socioeconomic History  ? Marital status: Married  ?  Spouse name: JoAZARI HASLER? Number of children: Not on file  ? Years of education: Not on file  ? Highest education level: GED or equivalent  ?Occupational History  ? Not on file  ?Tobacco Use  ? Smoking status: Former  ?  Types: Cigarettes  ?  Quit date: 2001  ?  Years since quitting: 22.3  ? Smokeless tobacco: Never  ?Vaping Use  ? Vaping Use: Never used  ?Substance and Sexual Activity  ? Alcohol use: Yes  ?  Comment: 1-2 glasses of wine occassionally  ? Drug use: No  ? Sexual activity: Not Currently  ?Other Topics Concern  ? Not on file  ?Social History Narrative  ? Not on  file  ? ?Social Determinants of Health  ? ?Financial Resource Strain: Low Risk   ? Difficulty of Paying Living Expenses: Not very hard  ?Food Insecurity: No Food Insecurity  ? Worried About RuCharity fundraisern the Last Year: Never true  ? Ran Out of Food in the Last Year: Never true  ?Transportation Needs: No Transportation Needs  ? Lack of Transportation (Medical): No  ? Lack of Transportation (Non-Medical): No  ?Physical Activity: Sufficiently Active  ? Days of Exercise per Week: 5 days  ? Minutes of Exercise per Session: 30 min  ?Stress: No Stress Concern Present  ? Feeling of Stress : Not at all  ?Social Connections: Socially Integrated  ? Frequency of Communication with Friends and Family: More than three times a week  ? Frequency of Social Gatherings with Friends and Family: More than three times a week  ? Attends Religious Services: More than 4 times per year  ? Active Member of Clubs or Organizations: Yes  ? Attends ClArchivisteetings: More than 4 times per year  ? Marital  Status: Married  ?Intimate Partner Violence: Not At Risk  ? Fear of Current or Ex-Partner: No  ? Emotionally Abused: No  ? Physically Abused: No  ? Sexually Abused: No  ? ? ? ?Current Outpatient Medications:  ?  ACCU-CHEK AVIVA PLUS test strip, USE ONE STRIP DAILY AS DIRECTED, Disp: 100 strip, Rfl: 2 ?  aspirin 81 MG tablet, Take 81 mg by mouth daily., Disp: , Rfl:  ?  atenolol (TENORMIN) 25 MG tablet, Take 1 tablet (25 mg total) by mouth daily., Disp: 90 tablet, Rfl: 3 ?  atorvastatin (LIPITOR) 40 MG tablet, Take 1 tablet (40 mg total) by mouth daily., Disp: 90 tablet, Rfl: 3 ?  clopidogrel (PLAVIX) 75 MG tablet, Take 1 tablet (75 mg total) by mouth daily., Disp: 30 tablet, Rfl: 3 ?  empagliflozin (JARDIANCE) 10 MG TABS tablet, Take 1 tablet (10 mg total) by mouth daily before breakfast., Disp: 30 tablet, Rfl: 6 ?  ezetimibe (ZETIA) 10 MG tablet, Take 1 tablet (10 mg total) by mouth daily., Disp: 90 tablet, Rfl: 3 ?   glipiZIDE (GLUCOTROL XL) 2.5 MG 24 hr tablet, Take 1 tablet (2.5 mg total) by mouth daily., Disp: 90 tablet, Rfl: 3 ?  Insulin Pen Needle (PEN NEEDLES) 33G X 4 MM MISC, Use to administer insulin daily, Disp: 30 each, Rfl: 6 ?  isosorbide mononitrate (IMDUR) 120 MG 24 hr tablet, Take 1 tablet (120 mg total) by mouth daily., Disp: 90 tablet, Rfl: 3 ?  LANTUS SOLOSTAR 100 UNIT/ML Solostar Pen, Inject 50 Units into the skin daily as needed (blood glucose >150)., Disp: 15 mL, Rfl: 1 ?  metFORMIN (GLUCOPHAGE) 1000 MG tablet, TAKE 1 TABLET BY MOUTH TWICE A DAY, Disp: 180 tablet, Rfl: 4 ?  nitroGLYCERIN (NITROSTAT) 0.4 MG SL tablet, Place 1 tablet (0.4 mg total) under the tongue every 5 (five) minutes as needed. Maximum of 3 doses., Disp: 25 tablet, Rfl: 3 ?  omeprazole (PRILOSEC) 20 MG capsule, Take 1 capsule (20 mg total) by mouth daily., Disp: 90 capsule, Rfl: 3 ?  quinapril (ACCUPRIL) 40 MG tablet, Take 1 tablet (40 mg total) by mouth daily., Disp: 90 tablet, Rfl: 5 ?  torsemide (DEMADEX) 10 MG tablet, Take 1 tablet (10 mg total) by mouth daily as needed (As needed for swelling, shortness of breath, or weight gain.)., Disp: 90 tablet, Rfl: 0  ? ?Allergies  ?Allergen Reactions  ? Ampicillin Hives  ? Penicillins Hives and Swelling  ? ? ?ROS ?Review of Systems  ?Constitutional: Negative.   ?HENT: Negative.    ?Eyes: Negative.   ?Respiratory: Negative.    ?Cardiovascular: Negative.   ?Gastrointestinal: Negative.   ?Endocrine: Negative.   ?Genitourinary: Negative.   ?Musculoskeletal: Negative.   ?Skin: Negative.   ?Allergic/Immunologic: Negative.   ?Neurological: Negative.   ?Hematological: Negative.   ?Psychiatric/Behavioral: Negative.    ?All other systems reviewed and are negative. ? ?  ?Objective:  ?  ?Physical Exam ?Vitals reviewed.  ?Constitutional:   ?   Appearance: Normal appearance.  ?HENT:  ?   Mouth/Throat:  ?   Mouth: Mucous membranes are moist.  ?Eyes:  ?   Pupils: Pupils are equal, round, and reactive to light.   ?Neck:  ?   Vascular: No carotid bruit.  ?Cardiovascular:  ?   Rate and Rhythm: Normal rate and regular rhythm.  ?   Pulses: Normal pulses.  ?   Heart sounds: Normal heart sounds.  ?Pulmonary:  ?   Effort: Pulmonary effort is normal.  ?  Breath sounds: Normal breath sounds.  ?Abdominal:  ?   General: Bowel sounds are normal.  ?   Palpations: Abdomen is soft. There is no hepatomegaly, splenomegaly or mass.  ?   Tenderness: There is no abdominal tenderness.  ?   Hernia: No hernia is present.  ?Musculoskeletal:  ?   Cervical back: Neck supple.  ?   Right lower leg: No edema.  ?   Left lower leg: No edema.  ?Skin: ?   Findings: No rash.  ?Neurological:  ?   Mental Status: He is alert and oriented to person, place, and time.  ?   Motor: No weakness.  ?Psychiatric:     ?   Mood and Affect: Mood normal.     ?   Behavior: Behavior normal.  ? ? ?BP (!) 144/74   Pulse 62   Ht 5' 6" (1.676 m)   Wt 167 lb 1.6 oz (75.8 kg)   BMI 26.97 kg/m?  ?Wt Readings from Last 3 Encounters:  ?01/11/22 167 lb 1.6 oz (75.8 kg)  ?12/28/21 166 lb 1.6 oz (75.3 kg)  ?12/25/21 165 lb 3.2 oz (74.9 kg)  ? ? ? ?Health Maintenance Due  ?Topic Date Due  ? FOOT EXAM  Never done  ? Hepatitis C Screening  Never done  ? Zoster Vaccines- Shingrix (1 of 2) Never done  ? COLONOSCOPY (Pts 45-26yr Insurance coverage will need to be confirmed)  Never done  ? TETANUS/TDAP  06/05/2019  ? COVID-19 Vaccine (4 - Booster for Moderna series) 09/10/2020  ? OPHTHALMOLOGY EXAM  01/07/2022  ? ? ?There are no preventive care reminders to display for this patient. ? ?Lab Results  ?Component Value Date  ? TSH 0.958 09/14/2016  ? ?Lab Results  ?Component Value Date  ? WBC 7.9 12/25/2021  ? HGB 16.2 12/25/2021  ? HCT 48.5 12/25/2021  ? MCV 88.2 12/25/2021  ? PLT 183 12/25/2021  ? ?Lab Results  ?Component Value Date  ? NA 138 12/25/2021  ? K 4.6 12/25/2021  ? CO2 21 (L) 12/25/2021  ? GLUCOSE 234 (H) 12/25/2021  ? BUN 15 12/25/2021  ? CREATININE 0.88 12/25/2021  ? BILITOT  0.8 12/25/2021  ? ALKPHOS 60 12/25/2021  ? AST 32 12/25/2021  ? ALT 27 12/25/2021  ? PROT 7.7 12/25/2021  ? ALBUMIN 4.1 12/25/2021  ? CALCIUM 9.2 12/25/2021  ? ANIONGAP 13 12/25/2021  ? EGFR 73 08/03/2021  ? ?[place]

## 2022-01-11 NOTE — Assessment & Plan Note (Signed)

## 2022-01-11 NOTE — Assessment & Plan Note (Signed)

## 2022-01-11 NOTE — Assessment & Plan Note (Signed)
-   Patient experiencing high levels of anxiety.  - Encouraged patient to engage in relaxing activities like yoga, meditation, journaling, going for a walk, or participating in a hobby.  - Encouraged patient to reach out to trusted friends or family members about recent struggles, Patient was advised to read A book, how to stop worrying and start living, it is good book to read to control  the stress  

## 2022-01-11 NOTE — Assessment & Plan Note (Signed)
Patient is improving.  He can walk strength is good in the both hands, he was advised to continue taking Plavix 75 mg p.o. daily.  And keep blood pressure and blood sugar under control ?

## 2022-01-30 ENCOUNTER — Other Ambulatory Visit: Payer: Self-pay | Admitting: Physician Assistant

## 2022-01-30 ENCOUNTER — Other Ambulatory Visit: Payer: Self-pay | Admitting: Internal Medicine

## 2022-01-30 DIAGNOSIS — Z1331 Encounter for screening for depression: Secondary | ICD-10-CM

## 2022-02-02 ENCOUNTER — Ambulatory Visit: Payer: Medicare HMO | Admitting: Cardiovascular Disease

## 2022-02-02 ENCOUNTER — Encounter: Payer: Self-pay | Admitting: Cardiovascular Disease

## 2022-02-02 VITALS — BP 140/70 | HR 59 | Ht 67.0 in | Wt 165.6 lb

## 2022-02-02 DIAGNOSIS — E785 Hyperlipidemia, unspecified: Secondary | ICD-10-CM

## 2022-02-02 DIAGNOSIS — I5032 Chronic diastolic (congestive) heart failure: Secondary | ICD-10-CM | POA: Diagnosis not present

## 2022-02-02 DIAGNOSIS — I25118 Atherosclerotic heart disease of native coronary artery with other forms of angina pectoris: Secondary | ICD-10-CM

## 2022-02-02 DIAGNOSIS — I10 Essential (primary) hypertension: Secondary | ICD-10-CM | POA: Diagnosis not present

## 2022-02-02 NOTE — Progress Notes (Signed)
Cardiology Office Note   Date:  02/02/2022   ID:  Scott Doyle, DOB 1961/08/03, MRN [id number]  PCP:  Cletis Athens, MD  Cardiologist:   Kathlyn Sacramento, MD   Chief Complaint  Patient presents with   [name] follow up for CVA. Patient states that he feels sleepy a lot.       History of Present Illness: Scott Doyle is a 61 y.o. male who presents for  followup visit regarding coronary artery disease. He is status post CABG and PCI of diagonal. He has other medical problems that include hypertension, hyperlipidemia and type 2 diabetes. Most recent cardiac catheterization in December 2015 showed patent LIMA to LAD and patent stents in the diagonal.  There was mild proximal RCA stenosis and overall no evidence of obstructive coronary artery disease. Most recent [place] in April 2022 showed no evidence of ischemia with normal ejection fraction. The patient was hospitalized in April with left-sided weakness and numbness and was diagnosed with thalamic infarct.  Clopidogrel was added by neurology.  Echocardiogram showed normal LV systolic function with no significant valvular abnormalities.  The patient has been doing well from a cardiac standpoint with no chest pain, shortness of breath or palpitations.    Past Medical History:  Diagnosis Date   Anxiety    Coronary artery disease    a. s/p prior CABG x 1 (LIMA->LAD) and PCI to the D2; b. 01/2012 PCI: patent prox Diag stent w/ sev distal dzs->DES x 1; c. 08/2014 Cath: LM nl, LAD 2m D2 patent stents w/ 346mLCX nl, RCA 40p, LIMA->LAD nl, EF 60%.   [place] ([place]re[place] vascular [place]) [place]   Diabetes mellitus    Hyperlipidemia    Hypertension    [facility]    Past Surgical History:  Procedure Laterality Date   CARDIAC CATHETERIZATION     CARDIAC CATHETERIZATION  08/2014   Patent LIMA to LAD and patent diagonal stents.   CORONARY ARTERY BYPASS GRAFT  2001   [facility]     Current Outpatient  Medications  Medication Sig Dispense Refill   ACCU-CHEK AVIVA PLUS test strip USE ONE STRIP DAILY AS DIRECTED 100 strip 2   aspirin 81 MG tablet Take 81 mg by mouth daily.     atenolol (TENORMIN) 25 MG tablet Take 1 tablet (25 mg total) by mouth daily. 90 tablet 3   atorvastatin (LIPITOR) 40 MG tablet Take 1 tablet (40 mg total) by mouth daily. 90 tablet 3   clopidogrel (PLAVIX) 75 MG tablet Take 1 tablet (75 mg total) by mouth daily. 30 tablet 3   empagliflozin (JARDIANCE) 10 MG TABS tablet Take 1 tablet (10 mg total) by mouth daily before breakfast. 30 tablet 6   ezetimibe (ZETIA) 10 MG tablet Take 1 tablet (10 mg total) by mouth daily. 90 tablet 3   glipiZIDE (GLUCOTROL XL) 2.5 MG 24 hr tablet Take 1 tablet (2.5 mg total) by mouth daily. 90 tablet 3   Insulin Pen Needle (PEN NEEDLES) 33G X 4 MM MISC Use to administer insulin daily 30 each 6   isosorbide mononitrate (IMDUR) 120 MG 24 hr tablet Take 1 tablet (120 mg total) by mouth daily. 90 tablet 3   LANTUS SOLOSTAR 100 UNIT/ML Solostar Pen Inject 50 Units into the skin daily as needed (blood glucose >150). 15 mL 1   metFORMIN (GLUCOPHAGE) 1000 MG tablet TAKE 1 TABLET BY MOUTH TWICE A DAY 180 tablet 1   nitroGLYCERIN (  NITROSTAT) 0.4 MG SL tablet Place 1 tablet (0.4 mg total) under the tongue every 5 (five) minutes as needed. Maximum of 3 doses. 25 tablet 3   quinapril (ACCUPRIL) 40 MG tablet Take 1 tablet (40 mg total) by mouth daily. 90 tablet 5   torsemide (DEMADEX) 10 MG tablet TAKE 1 TABLET BY MOUTH EVERY DAY 90 tablet 0   omeprazole (PRILOSEC) 20 MG capsule Take 1 capsule (20 mg total) by mouth daily. (Patient not taking: Reported on 02/02/2022) 90 capsule 3   No current facility-administered medications for this visit.    Allergies:   Ampicillin and Penicillins    Social History:  The patient  reports that he quit smoking about 22 years ago. His smoking use included cigarettes. He has never used smokeless tobacco. He reports current  alcohol use. He reports that he does not use drugs.   Family History:  The patient's family history includes Cancer in an other family member; Diabetes in an other family member; Heart disease in his mother; Hypertension in his mother.    ROS:  Please see the history of present illness.   Otherwise, review of systems are positive for none.   All other systems are reviewed and negative.    PHYSICAL EXAM: VS:  BP 140/70 (BP Location: Left Arm, Patient Position: Sitting, Cuff Size: Normal)   Pulse (!) 59   Ht '5\' 7"'$  (1.702 m)   Wt 165 lb 9.6 oz (75.1 kg)   SpO2 97%   BMI 25.94 kg/m  , BMI Body mass index is 25.94 kg/m. GEN: Well nourished, well developed, in no acute distress  HEENT: normal  Neck: no JVD, carotid bruits, or masses Cardiac: RRR; no rubs, or gallops,no edema . There is 1/ 6 systolic ejection murmur in the aortic area Respiratory:  clear to auscultation bilaterally, normal work of breathing GI: soft, nontender, nondistended, + BS MS: no deformity or atrophy  Skin: warm and dry, no rash Neuro:  Strength and sensation are intact Psych: euthymic mood, full affect   EKG:  EKG is ordered today. The ekg ordered today demonstrates sinus bradycardia with no significant ST or T wave changes.  Recent Labs: 12/25/2021: ALT 27; B Natriuretic Peptide 76.2; BUN 15; Creatinine, Ser 0.88; Hemoglobin 16.2; Platelets 183; Potassium 4.6; Sodium 138    Lipid Panel    Component Value Date/Time   CHOL 124 12/26/2021 0538   CHOL 95 11/25/2011 1201   TRIG 197 (H) 12/26/2021 0538   TRIG 76 11/25/2011 1201   HDL 26 (L) 12/26/2021 0538   HDL 29 (L) 11/25/2011 1201   CHOLHDL 4.8 12/26/2021 0538   VLDL 39 12/26/2021 0538   VLDL 15 11/25/2011 1201   LDLCALC 59 12/26/2021 0538   LDLCALC 51 11/25/2011 1201   LDLDIRECT 60.1 02/15/2021 1717      Wt Readings from Last 3 Encounters:  02/02/22 165 lb 9.6 oz (75.1 kg)  01/11/22 167 lb 1.6 oz (75.8 kg)  12/28/21 166 lb 1.6 oz (75.3 kg)        ASSESSMENT AND PLAN:  1.  Coronary artery disease involving native coronary arteries with other forms of angina: The patient has prolonged history of atypical angina.  He seems to be stable on beta-blocker and long-acting nitroglycerin.  Most recent [place] in 2022 showed no evidence of ischemia.    2.  Chronic diastolic heart failure: He appears to be euvolemic on small dose torsemide 10 mg daily.  He is also on Jardiance 10 mg  daily.    3.  Essential hypertension: Blood pressures well controlled on current medications.  4. Hyperlipidemia: I reviewed most recent lipid profile done in April which showed an LDL of 59 which is at target.  Continue treatment with atorvastatin and ezetimibe.  5. Type 2 diabetes: Still not optimally controlled.   I discussed healthy diet with him.  He tends to do a lot of cooking and likes making desserts.  5.  Mild bilateral carotid disease: Most recent carotid Doppler showed mild nonobstructive disease less than 40%.  No need to repeat unless there is a clinical indication.   Disposition:   FU with me in 6 months.  Signed,  Kathlyn Sacramento, MD  02/02/2022 5:42 PM    [facility]

## 2022-02-02 NOTE — Patient Instructions (Signed)
Medication Instructions:  Your physician recommends that you continue on your current medications as directed. Please refer to the Current Medication list given to you today.  *If you need a refill on your cardiac medications before your next appointment, please call your pharmacy*   Lab Work: None ordered If you have labs (blood work) drawn today and your tests are completely normal, you will receive your results only by: [place] (if you have MyChart) OR A paper copy in the mail If you have any lab test that is abnormal or we need to change your treatment, we will call you to review the results.   Testing/Procedures: None ordered   Follow-Up: At [facility], you and your health needs are our priority.  As part of our continuing mission to provide you with exceptional heart care, we have created designated Provider Care Teams.  These Care Teams include your primary Cardiologist (physician) and Advanced Practice Providers (APPs -  Physician Assistants and Nurse Practitioners) who all work together to provide you with the care you need, when you need it.  We recommend signing up for the patient portal called "MyChart".  Sign up information is provided on this After Visit Summary.  MyChart is used to connect with patients for Virtual Visits (Telemedicine).  Patients are able to view lab/test results, encounter notes, upcoming appointments, etc.  Non-urgent messages can be sent to your provider as well.   To learn more about what you can do with MyChart, go to [email or web address].    Your next appointment:   6 month(s)  The format for your next appointment:   In Person  Provider:   You may see Kathlyn Sacramento, MD or one of the following Advanced Practice Providers on your designated Care Team:   Murray Hodgkins, NP Christell Faith, PA-C Cadence Kathlen Mody, [place]    Other Instructions   Important Information About Sugar

## 2022-02-08 ENCOUNTER — Encounter: Payer: Self-pay | Admitting: Internal Medicine

## 2022-02-08 ENCOUNTER — Ambulatory Visit (INDEPENDENT_AMBULATORY_CARE_PROVIDER_SITE_OTHER): Payer: Medicare HMO | Admitting: Internal Medicine

## 2022-02-08 VITALS — BP 129/65 | HR 55 | Ht 67.0 in | Wt 164.5 lb

## 2022-02-08 DIAGNOSIS — E119 Type 2 diabetes mellitus without complications: Secondary | ICD-10-CM

## 2022-02-08 DIAGNOSIS — F250 Schizoaffective disorder, bipolar type: Secondary | ICD-10-CM

## 2022-02-08 DIAGNOSIS — I5032 Chronic diastolic (congestive) heart failure: Secondary | ICD-10-CM

## 2022-02-08 DIAGNOSIS — E785 Hyperlipidemia, unspecified: Secondary | ICD-10-CM

## 2022-02-08 DIAGNOSIS — E139 Other specified diabetes mellitus without complications: Secondary | ICD-10-CM | POA: Diagnosis not present

## 2022-02-08 DIAGNOSIS — I6381 Other cerebral infarction due to occlusion or stenosis of small artery: Secondary | ICD-10-CM | POA: Diagnosis not present

## 2022-02-08 DIAGNOSIS — I10 Essential (primary) hypertension: Secondary | ICD-10-CM | POA: Diagnosis not present

## 2022-02-08 DIAGNOSIS — I25118 Atherosclerotic heart disease of native coronary artery with other forms of angina pectoris: Secondary | ICD-10-CM

## 2022-02-08 LAB — GLUCOSE, POCT (MANUAL RESULT ENTRY): POC Glucose: 192 mg/dl — AB (ref 70–99)

## 2022-02-08 NOTE — Assessment & Plan Note (Signed)

## 2022-02-08 NOTE — Assessment & Plan Note (Signed)
Heart is regular chest no rales.  Congestive heart failure is under control.

## 2022-02-08 NOTE — Assessment & Plan Note (Signed)
Stable at the present time. 

## 2022-02-08 NOTE — Assessment & Plan Note (Signed)
Hypercholesterolemia  I advised the patient to follow Mediterranean diet This diet is rich in fruits vegetables and whole grain, and This diet is also rich in fish and lean meat Patient should also eat a handful of almonds or walnuts daily Recent heart study indicated that average follow-up on this kind of diet reduces the cardiovascular mortality by 50 to 70%== 

## 2022-02-08 NOTE — Progress Notes (Signed)
Established Patient Office Visit  Subjective:  Patient ID: Scott Doyle, male    DOB: [birth year]  Age: 61 y.o. MRN: [id number]  CC:  Chief Complaint  Patient presents with   Diabetes    1 month follow up     Diabetes   Scott Doyle presents for general checkup.lacune infarct of rt thalmus, patient is known to have hyperlipidemia and diabetes hypertension with hypertensive cardiovascular disease has a history of bypass surgery also has a stent placement.  He has a history of bipolar disorder and congestive heart failure.  He is doing well he can walk without any assistance.  He complains of some pain and tingling of the left hand.  Coordination is okay.  Past Medical History:  Diagnosis Date   Anxiety    Coronary artery disease    a. s/p prior CABG x 1 (LIMA->LAD) and PCI to the D2; b. 01/2012 PCI: patent prox Diag stent w/ sev distal dzs->DES x 1; c. 08/2014 Cath: LM nl, LAD 20m D2 patent stents w/ 373mLCX nl, RCA 40p, LIMA->LAD nl, EF 60%.   [place]   Diabetes mellitus    Hyperlipidemia    Hypertension    [facility]    Past Surgical History:  Procedure Laterality Date   CARDIAC CATHETERIZATION     CARDIAC CATHETERIZATION  08/2014   Patent LIMA to LAD and patent diagonal stents.   CORONARY ARTERY BYPASS GRAFT  2001   [facility]    Family History  Problem Relation Age of Onset   Hypertension Mother    Heart disease Mother    Diabetes Other        fx   Cancer Other        mat. family    Social History   Socioeconomic History   Marital status: Married    Spouse name: Scott Doyle Number of children: Not on file   Years of education: Not on file   Highest education level: GED or equivalent  Occupational History   Not on file  Tobacco Use   Smoking status: Former    Types: Cigarettes    Quit date: 2001    Years since quitting: 22.4   Smokeless tobacco: Never  Vaping Use   Vaping Use: Never used  Substance and  Sexual Activity   Alcohol use: Yes    Comment: 1-2 glasses of wine occassionally   Drug use: No   Sexual activity: Not Currently  Other Topics Concern   Not on file  Social History Narrative   Not on file   Social Determinants of Health   Financial Resource Strain: Low Risk    Difficulty of Paying Living Expenses: Not very hard  Food Insecurity: No Food Insecurity   Worried About RuCharity fundraisern the Last Year: Never true   RaPrairievillen the Last Year: Never true  Transportation Needs: No Transportation Needs   Lack of Transportation (Medical): No   Lack of Transportation (Non-Medical): No  Physical Activity: Sufficiently Active   Days of Exercise per Week: 5 days   Minutes of Exercise per Session: 30 min  Stress: No Stress Concern Present   Feeling of Stress : Not at all  Social Connections: Socially Integrated   Frequency of Communication with Friends and Family: More than three times a week   Frequency of Social Gatherings with Friends and Family: More than three times a week  Attends Religious Services: More than 4 times per year   Active Member of Clubs or Organizations: Yes   Attends Archivist Meetings: More than 4 times per year   Marital Status: Married  Human resources officer Violence: Not At Risk   Fear of Current or Ex-Partner: No   Emotionally Abused: No   Physically Abused: No   Sexually Abused: No     Current Outpatient Medications:    ACCU-CHEK AVIVA PLUS test strip, USE ONE STRIP DAILY AS DIRECTED, Disp: 100 strip, Rfl: 2   aspirin 81 MG tablet, Take 81 mg by mouth daily., Disp: , Rfl:    atenolol (TENORMIN) 25 MG tablet, Take 1 tablet (25 mg total) by mouth daily., Disp: 90 tablet, Rfl: 3   atorvastatin (LIPITOR) 40 MG tablet, Take 1 tablet (40 mg total) by mouth daily., Disp: 90 tablet, Rfl: 3   clopidogrel (PLAVIX) 75 MG tablet, Take 1 tablet (75 mg total) by mouth daily., Disp: 30 tablet, Rfl: 3   empagliflozin (JARDIANCE) 10 MG TABS  tablet, Take 1 tablet (10 mg total) by mouth daily before breakfast., Disp: 30 tablet, Rfl: 6   ezetimibe (ZETIA) 10 MG tablet, Take 1 tablet (10 mg total) by mouth daily., Disp: 90 tablet, Rfl: 3   glipiZIDE (GLUCOTROL XL) 2.5 MG 24 hr tablet, Take 1 tablet (2.5 mg total) by mouth daily., Disp: 90 tablet, Rfl: 3   Insulin Pen Needle (PEN NEEDLES) 33G X 4 MM MISC, Use to administer insulin daily, Disp: 30 each, Rfl: 6   isosorbide mononitrate (IMDUR) 120 MG 24 hr tablet, Take 1 tablet (120 mg total) by mouth daily., Disp: 90 tablet, Rfl: 3   LANTUS SOLOSTAR 100 UNIT/ML Solostar Pen, Inject 50 Units into the skin daily as needed (blood glucose >150)., Disp: 15 mL, Rfl: 1   metFORMIN (GLUCOPHAGE) 1000 MG tablet, TAKE 1 TABLET BY MOUTH TWICE A DAY, Disp: 180 tablet, Rfl: 1   nitroGLYCERIN (NITROSTAT) 0.4 MG SL tablet, Place 1 tablet (0.4 mg total) under the tongue every 5 (five) minutes as needed. Maximum of 3 doses., Disp: 25 tablet, Rfl: 3   omeprazole (PRILOSEC) 20 MG capsule, Take 1 capsule (20 mg total) by mouth daily. (Patient not taking: Reported on 02/02/2022), Disp: 90 capsule, Rfl: 3   quinapril (ACCUPRIL) 40 MG tablet, Take 1 tablet (40 mg total) by mouth daily., Disp: 90 tablet, Rfl: 5   torsemide (DEMADEX) 10 MG tablet, TAKE 1 TABLET BY MOUTH EVERY DAY, Disp: 90 tablet, Rfl: 0   Allergies  Allergen Reactions   Ampicillin Hives   Penicillins Hives and Swelling    ROS Review of Systems  Constitutional: Negative.   HENT: Negative.    Eyes: Negative.   Respiratory: Negative.    Cardiovascular: Negative.   Gastrointestinal: Negative.   Endocrine: Negative.   Genitourinary: Negative.   Musculoskeletal: Negative.   Skin: Negative.   Allergic/Immunologic: Negative.   Neurological: Negative.   Hematological: Negative.   Psychiatric/Behavioral: Negative.    All other systems reviewed and are negative.    Objective:    Physical Exam Vitals reviewed.  Constitutional:       Appearance: Normal appearance.  HENT:     Mouth/Throat:     Mouth: Mucous membranes are moist.  Eyes:     Pupils: Pupils are equal, round, and reactive to light.  Neck:     Vascular: No carotid bruit.  Cardiovascular:     Rate and Rhythm: Normal rate and regular rhythm.  Pulses: Normal pulses.     Heart sounds: Normal heart sounds.  Pulmonary:     Effort: Pulmonary effort is normal.     Breath sounds: Normal breath sounds.  Abdominal:     General: Bowel sounds are normal.     Palpations: Abdomen is soft. There is no hepatomegaly, splenomegaly or mass.     Tenderness: There is no abdominal tenderness.     Hernia: No hernia is present.  Musculoskeletal:     Cervical back: Neck supple.     Right lower leg: No edema.     Left lower leg: No edema.  Skin:    Findings: No rash.  Neurological:     Mental Status: He is alert and oriented to person, place, and time.     Motor: No weakness.  Psychiatric:        Mood and Affect: Mood normal.        Behavior: Behavior normal.    BP 129/65   Pulse (!) 55   Ht '5\' 7"'  (1.702 m)   Wt 164 lb 8 oz (74.6 kg)   BMI 25.76 kg/m  Wt Readings from Last 3 Encounters:  02/08/22 164 lb 8 oz (74.6 kg)  02/02/22 165 lb 9.6 oz (75.1 kg)  01/11/22 167 lb 1.6 oz (75.8 kg)     Health Maintenance Due  Topic Date Due   FOOT EXAM  Never done   Hepatitis C Screening  Never done   Zoster Vaccines- Shingrix (1 of 2) Never done   COLONOSCOPY (Pts 45-13yr Insurance coverage will need to be confirmed)  Never done   TETANUS/TDAP  06/05/2019   COVID-19 Vaccine (4 - Booster for Moderna series) 09/10/2020   OPHTHALMOLOGY EXAM  01/07/2022    There are no preventive care reminders to display for this patient.  Lab Results  Component Value Date   TSH 0.958 09/14/2016   Lab Results  Component Value Date   WBC 7.9 12/25/2021   HGB 16.2 12/25/2021   HCT 48.5 12/25/2021   MCV 88.2 12/25/2021   PLT 183 12/25/2021   Lab Results  Component Value  Date   NA 138 12/25/2021   K 4.6 12/25/2021   CO2 21 (L) 12/25/2021   GLUCOSE 234 (H) 12/25/2021   BUN 15 12/25/2021   CREATININE 0.88 12/25/2021   BILITOT 0.8 12/25/2021   ALKPHOS 60 12/25/2021   AST 32 12/25/2021   ALT 27 12/25/2021   PROT 7.7 12/25/2021   ALBUMIN 4.1 12/25/2021   CALCIUM 9.2 12/25/2021   ANIONGAP 13 12/25/2021   EGFR 73 08/03/2021   Lab Results  Component Value Date   CHOL 124 12/26/2021   Lab Results  Component Value Date   HDL 26 (L) 12/26/2021   Lab Results  Component Value Date   LDLCALC 59 12/26/2021   Lab Results  Component Value Date   TRIG 197 (H) 12/26/2021   Lab Results  Component Value Date   CHOLHDL 4.8 12/26/2021   Lab Results  Component Value Date   HGBA1C 8.7 (H) 12/25/2021      Assessment & Plan:   Problem List Items Addressed This Visit       Cardiovascular and Mediastinum   Coronary artery disease    Patient denies any chest pain chest is clear heart is regular       Essential hypertension    The following hypertensive lifestyle modification were recommended and discussed:  1. Limiting alcohol intake to less than 1 oz/day of ethanol:(24 oz  of beer or 8 oz of wine or 2 oz of 100-proof whiskey). 2. Take baby ASA 81 mg daily. 3. Importance of regular aerobic exercise and losing weight. 4. Reduce dietary saturated fat and cholesterol intake for overall cardiovascular health. 5. Maintaining adequate dietary potassium, calcium, and magnesium intake. 6. Regular monitoring of the blood pressure. 7. Reduce sodium intake to less than 100 mmol/day (less than 2.3 gm of sodium or less than 6 gm of sodium choride)        Right thalamic stroke [facility])    Patient is improving gradually and progressively       Chronic diastolic CHF (congestive heart failure) ([place])    Heart is regular chest no rales.  Congestive heart failure is under control.         Endocrine   Diabetes 1.5, managed as type 2 ([place])    - The patient's  blood sugar is labile on med. - The patient will continue the current treatment regimen.  - I encouraged the patient to regularly check blood sugar.  - I encouraged the patient to monitor diet. I encouraged the patient to eat low-carb and low-sugar to help prevent blood sugar spikes.  - I encouraged the patient to continue following their prescribed treatment plan for diabetes - I informed the patient to get help if blood sugar drops below 43m/dL, or if suddenly have trouble thinking clearly or breathing.  Patient was advised to buy a book on diabetes from a local bookstore or from [place]  Patient should read 2 chapters every day to keep the motivation going, this is in addition to some of the materials we provided them from the office.  There are other resources on the Internet like YouTube and wilkipedia to get an education on the diabetes         Other   Hyperlipidemia    Hypercholesterolemia  I advised the patient to follow Mediterranean diet This diet is rich in fruits vegetables and whole grain, and This diet is also rich in fish and lean meat Patient should also eat a handful of almonds or walnuts daily Recent heart study indicated that average follow-up on this kind of diet reduces the cardiovascular mortality by 50 to 70%==       Schizoaffective disorder ([place]    Stable at the present time       Other Visit Diagnoses     Type 2 diabetes mellitus without complication, without long-term current use of insulin ([place]    -  Primary   Relevant Orders   POCT glucose (manual entry) (Completed)       No orders of the defined types were placed in this encounter.   Follow-up: No follow-ups on file.    JCletis Athens MD

## 2022-02-08 NOTE — Assessment & Plan Note (Signed)
Patient is improving gradually and progressively

## 2022-02-08 NOTE — Assessment & Plan Note (Signed)

## 2022-02-08 NOTE — Assessment & Plan Note (Signed)
Patient denies any chest pain chest is clear heart is regular

## 2022-03-06 ENCOUNTER — Other Ambulatory Visit: Payer: Self-pay | Admitting: Physician Assistant

## 2022-04-05 ENCOUNTER — Ambulatory Visit: Payer: Medicare HMO | Admitting: Cardiovascular Disease

## 2022-04-24 ENCOUNTER — Other Ambulatory Visit: Payer: Self-pay | Admitting: Physician Assistant

## 2022-04-28 ENCOUNTER — Other Ambulatory Visit: Payer: Self-pay | Admitting: *Deleted

## 2022-04-28 MED ORDER — CLOPIDOGREL BISULFATE 75 MG PO TABS: 75.0000 mg | ORAL_TABLET | Freq: Every day | ORAL | 3 refills | Status: DC

## 2022-05-08 ENCOUNTER — Other Ambulatory Visit: Payer: Self-pay | Admitting: Internal Medicine

## 2022-05-09 ENCOUNTER — Emergency Department: Payer: Medicare HMO

## 2022-05-09 ENCOUNTER — Emergency Department
Admission: EM | Admit: 2022-05-09 | Discharge: 2022-05-09 | Disposition: A | Discharge status: Home / Self Care | Payer: Medicare HMO | Attending: Emergency Medicine | Admitting: Emergency Medicine

## 2022-05-09 ENCOUNTER — Other Ambulatory Visit: Payer: Self-pay

## 2022-05-09 ENCOUNTER — Encounter: Payer: Self-pay | Admitting: Emergency Medicine

## 2022-05-09 DIAGNOSIS — S32020A Wedge compression fracture of second lumbar vertebra, initial encounter for closed fracture: Secondary | ICD-10-CM | POA: Diagnosis not present

## 2022-05-09 DIAGNOSIS — E119 Type 2 diabetes mellitus without complications: Secondary | ICD-10-CM | POA: Insufficient documentation

## 2022-05-09 DIAGNOSIS — S32000A Wedge compression fracture of unspecified lumbar vertebra, initial encounter for closed fracture: Secondary | ICD-10-CM | POA: Diagnosis not present

## 2022-05-09 DIAGNOSIS — S3992XA Unspecified injury of lower back, initial encounter: Secondary | ICD-10-CM | POA: Diagnosis present

## 2022-05-09 DIAGNOSIS — X500XXA Overexertion from strenuous movement or load, initial encounter: Secondary | ICD-10-CM | POA: Insufficient documentation

## 2022-05-09 DIAGNOSIS — M545 Low back pain, unspecified: Secondary | ICD-10-CM | POA: Diagnosis not present

## 2022-05-09 DIAGNOSIS — S32010A Wedge compression fracture of first lumbar vertebra, initial encounter for closed fracture: Secondary | ICD-10-CM | POA: Diagnosis not present

## 2022-05-09 DIAGNOSIS — I110 Hypertensive heart disease with heart failure: Secondary | ICD-10-CM | POA: Insufficient documentation

## 2022-05-09 DIAGNOSIS — I2510 Atherosclerotic heart disease of native coronary artery without angina pectoris: Secondary | ICD-10-CM | POA: Insufficient documentation

## 2022-05-09 DIAGNOSIS — M4316 Spondylolisthesis, lumbar region: Secondary | ICD-10-CM | POA: Diagnosis not present

## 2022-05-09 DIAGNOSIS — I509 Heart failure, unspecified: Secondary | ICD-10-CM | POA: Insufficient documentation

## 2022-05-09 DIAGNOSIS — M47816 Spondylosis without myelopathy or radiculopathy, lumbar region: Secondary | ICD-10-CM | POA: Diagnosis not present

## 2022-05-09 DIAGNOSIS — M4126 Other idiopathic scoliosis, lumbar region: Secondary | ICD-10-CM | POA: Diagnosis not present

## 2022-05-09 HISTORY — DX: Heart failure, unspecified: I50.9

## 2022-05-09 MED ORDER — IBUPROFEN 400 MG PO TABS
400.0000 mg | ORAL_TABLET | Freq: Once | ORAL | Status: AC
Start: 2022-05-09 — End: 2022-05-09
  Administered 2022-05-09: 400 mg via ORAL
  Filled 2022-05-09: qty 1

## 2022-05-09 NOTE — Discharge Instructions (Addendum)
Wear the TLSO brace when out of bed.  He may remove it to sleep.  May remove it to shower.  Take Tylenol and ibuprofen for pain as needed.  Return emergency department worsening.

## 2022-05-09 NOTE — ED Notes (Signed)
See triage note  Presents with lower back pain  States he was trying to help move a piano  And states he dropped it slightly States he became stuck in a bending position   Ambulates to treatment room

## 2022-05-09 NOTE — ED Notes (Signed)
Sitting on bench in room  Awaiting for brace   Explained the wait

## 2022-05-09 NOTE — ED Notes (Signed)
TLSO  BRACE  CALLED  FOR

## 2022-05-09 NOTE — ED Provider Notes (Signed)
[facility] Provider Note    Event Date/Time   First MD Initiated Contact with Patient 05/09/22 1127     (approximate)   History   Back Pain   HPI  Scott Doyle is a 61 y.o. male with history of diabetes, CAD, hyperlipidemia, hypertension, psychiatric history, and CHF presents emergency department complaining of low back pain.  Patient was trying to lift help lift a PLO yesterday.  Felt a pop and pain through the left lower back.  States pain is worse with movement.  No numbness or tingling.  States that his continued.  Took ibuprofen 400 mg last night which did help but pain returned this morning.  He denies any abdominal pain.  No chest pain or shortness of breath.      Physical Exam   Triage Vital Signs: ED Triage Vitals  Enc Vitals Group     BP 05/09/22 1039 (!) 160/84     Pulse Rate 05/09/22 1039 60     Resp 05/09/22 1039 16     Temp 05/09/22 1039 98.4 F (36.9 C)     Temp Source 05/09/22 1039 Oral     SpO2 05/09/22 1039 95 %     Weight 05/09/22 1032 165 lb (74.8 kg)     Height 05/09/22 1032 '5\' 6"'$  (1.676 m)     Head Circumference --      Peak Flow --      Pain Score 05/09/22 1032 4     Pain Loc --      Pain Edu? --      Excl. in [place]? --     Most recent vital signs: Vitals:   05/09/22 1039  BP: (!) 160/84  Pulse: 60  Resp: 16  Temp: 98.4 F (36.9 C)  SpO2: 95%     General: Awake, no distress.   CV:  Good peripheral perfusion. regular rate and  rhythm Resp:  Normal effort.  Abd:  No distention.   Other:  Lumbar spine is tender to palpation along the left aspect, paravertebral muscles tender and spasm.  Patient is able to stand on his toes and heels without difficulty, 5/5 strength lower extremities   ED Results / Procedures / Treatments   Labs (all labs ordered are listed, but only abnormal results are displayed) Labs Reviewed - No data to display   EKG     RADIOLOGY Right lumbar  spine    PROCEDURES:   Procedures   MEDICATIONS ORDERED IN ED: Medications  ibuprofen (ADVIL) tablet 400 mg (400 mg Oral Given 05/09/22 1144)     IMPRESSION / MDM / [place] / ED COURSE  I reviewed the triage vital signs and the nursing notes.                              Differential diagnosis includes, but is not limited to, lumbar strain, fracture, herniated disc  Patient's presentation is most consistent with acute complicated illness / injury requiring diagnostic workup.   Ibuprofen 400 mg given for pain.  Patient also get a x-ray of his lumbar spine   X-ray was independently reviewed and interpreted by me as possible compression fracture.  We will do CT to evaluate lumbar spine.  CT of the lumbar spine individually reviewed and interpreted by me as acute compression fracture.  Radiologist states 20% height loss  Consult to neurosurgery, Dr. Tamala Julian states to place in a TLSO when  out of bed.  Follow-up in the office.  Patient was given discharge instructions to follow-up with Dr. Nelly Laurence office.  Take ibuprofen for pain as needed.  Wear the TLSO brace while out of bed.  You may take it off to shower.  Discharged stable condition.   FINAL CLINICAL IMPRESSION(S) / ED DIAGNOSES   Final diagnoses:  Lumbar compression fracture, closed, initial encounter ([place])     Rx / DC Orders   ED Discharge Orders     None        Note:  This document was prepared using Dragon voice recognition software and may include unintentional dictation errors.    Versie Starks, PA-C 05/09/22 1547    Delman Kitten, MD 05/09/22 [phone]

## 2022-05-09 NOTE — Progress Notes (Signed)
Orthopedic Tech Progress Note Patient Details:  Scott Doyle [birth year] [id number]  Called in order to [facility] for TLSo brace Patient ID: Scott Doyle, male   DOB: 01/25/1961, 61 y.o.   MRN: [id number]  Scott Doyle 05/09/2022, 2:35 PM

## 2022-05-09 NOTE — ED Triage Notes (Addendum)
Pt was helping lift a piano yesterday and now c/o lower back pain. Ambulatory without difficulty.  Hx of sciatic nerve problems.  C/o severe pain in back.  Pain worse with movement.  Denies lower extremity weakness. Some tingling but present since had his stroke.

## 2022-05-10 ENCOUNTER — Encounter: Payer: Self-pay | Admitting: Internal Medicine

## 2022-05-10 ENCOUNTER — Ambulatory Visit (INDEPENDENT_AMBULATORY_CARE_PROVIDER_SITE_OTHER): Payer: Medicare HMO | Admitting: Internal Medicine

## 2022-05-10 VITALS — BP 168/84 | HR 89 | Ht 66.0 in | Wt 169.7 lb

## 2022-05-10 DIAGNOSIS — E139 Other specified diabetes mellitus without complications: Secondary | ICD-10-CM | POA: Diagnosis not present

## 2022-05-10 DIAGNOSIS — I10 Essential (primary) hypertension: Secondary | ICD-10-CM | POA: Diagnosis not present

## 2022-05-10 DIAGNOSIS — I6381 Other cerebral infarction due to occlusion or stenosis of small artery: Secondary | ICD-10-CM

## 2022-05-10 DIAGNOSIS — Z1211 Encounter for screening for malignant neoplasm of colon: Secondary | ICD-10-CM

## 2022-05-10 LAB — GLUCOSE, POCT (MANUAL RESULT ENTRY): POC Glucose: 173 mg/dl — AB (ref 70–99)

## 2022-05-10 LAB — POCT GLYCOSYLATED HEMOGLOBIN (HGB A1C): HbA1c POC (<> result, manual entry): 7.5 % (ref 4.0–5.6)

## 2022-05-10 MED ORDER — LANTUS SOLOSTAR 100 UNIT/ML ~~LOC~~ SOPN: 50.0000 [IU] | PEN_INJECTOR | Freq: Every day | SUBCUTANEOUS | 6 refills | Status: DC | PRN

## 2022-05-10 NOTE — Progress Notes (Signed)
Established Patient Office Visit  Subjective:  Patient ID: Scott Doyle, male    DOB: 1961-08-23  Age: 61 y.o. MRN: [id number]  CC:  Chief Complaint  Patient presents with   Diabetes    Diabetes    Scott Doyle presents for back pain  Past Medical History:  Diagnosis Date   Anxiety    CHF (congestive heart failure) ([place])    Coronary artery disease    a. s/p prior CABG x 1 (LIMA->LAD) and PCI to the D2; b. 01/2012 PCI: patent prox Diag stent w/ sev distal dzs->DES x 1; c. 08/2014 Cath: LM nl, LAD 68m D2 patent stents w/ 365mLCX nl, RCA 40p, LIMA->LAD nl, EF 60%.   CVA (cerebral vascular accident) ([facility]   Diabetes mellitus    Hyperlipidemia    Hypertension    [facility]    Past Surgical History:  Procedure Laterality Date   CARDIAC CATHETERIZATION     CARDIAC CATHETERIZATION  08/2014   Patent LIMA to LAD and patent diagonal stents.   CORONARY ARTERY BYPASS GRAFT  2001   [facility]    Family History  Problem Relation Age of Onset   Hypertension Mother    Heart disease Mother    Diabetes Other        fx   Cancer Other        mat. family    Social History   Socioeconomic History   Marital status: Married    Spouse name: JoJAVEION CANNEDY Number of children: Not on file   Years of education: Not on file   Highest education level: GED or equivalent  Occupational History   Not on file  Tobacco Use   Smoking status: Former    Types: Cigarettes    Quit date: 2001    Years since quitting: 22.6   Smokeless tobacco: Never  Vaping Use   Vaping Use: Never used  Substance and Sexual Activity   Alcohol use: Yes    Comment: 1-2 glasses of wine occassionally   Drug use: No   Sexual activity: Not Currently  Other Topics Concern   Not on file  Social History Narrative   Not on file   Social Determinants of Health   Financial Resource Strain: Low Risk  (09/01/2021)   Overall Financial Resource Strain (CARDIA)    Difficulty of Paying Living  Expenses: Not very hard  Food Insecurity: No Food Insecurity (09/01/2021)   Hunger Vital Sign    Worried About Running Out of Food in the Last Year: Never true    Ra[place]everlyn the Last Year: Never true  Transportation Needs: No Transportation Needs (09/01/2021)   PRAPARE - TrHydrologistMedical): No    Lack of Transportation (Non-Medical): No  Physical Activity: Sufficiently Active (09/01/2021)   Exercise Vital Sign    Days of Exercise per Week: 5 days    Minutes of Exercise per Session: 30 min  Stress: No Stress Concern Present (09/01/2021)   [place]  Feeling of Stress : Not at all  Social Connections: So[place]12/28/2022)   Social Connection and Isolation Panel [NHANES]    Frequency of Communication with Friends and Family: More than three times a week    Frequency of Social Gatherings with Friends and Family: More than three times a week    Attends Religious Services: More than 4 times per  year    Active Member of Clubs or Organizations: Yes    Attends Archivist Meetings: More than 4 times per year    Marital Status: Married  Human resources officer Violence: Not At Risk (09/01/2021)   Humiliation, Afraid, Rape, and Kick questionnaire    Fear of Current or Ex-Partner: No    Emotionally Abused: No    Physically Abused: No    Sexually Abused: No     Current Outpatient Medications:    ACCU-CHEK AVIVA PLUS test strip, USE ONE STRIP DAILY AS DIRECTED, Disp: 100 strip, Rfl: 2   aspirin 81 MG tablet, Take 81 mg by mouth daily., Disp: , Rfl:    atenolol (TENORMIN) 25 MG tablet, Take 1 tablet (25 mg total) by mouth daily., Disp: 90 tablet, Rfl: 3   atorvastatin (LIPITOR) 40 MG tablet, Take 1 tablet (40 mg total) by mouth daily., Disp: 90 tablet, Rfl: 3   clopidogrel (PLAVIX) 75 MG tablet, Take 1 tablet (75 mg total) by mouth daily., Disp: 90 tablet, Rfl: 3    ezetimibe (ZETIA) 10 MG tablet, Take 1 tablet (10 mg total) by mouth daily., Disp: 90 tablet, Rfl: 3   glipiZIDE (GLUCOTROL XL) 2.5 MG 24 hr tablet, TAKE 1 TABLET BY MOUTH EVERY DAY, Disp: 90 tablet, Rfl: 3   Insulin Pen Needle (PEN NEEDLES) 33G X 4 MM MISC, Use to administer insulin daily, Disp: 30 each, Rfl: 6   isosorbide mononitrate (IMDUR) 120 MG 24 hr tablet, Take 1 tablet (120 mg total) by mouth daily., Disp: 90 tablet, Rfl: 3   JARDIANCE 10 MG TABS tablet, TAKE 1 TABLET BY MOUTH DAILY BEFORE BREAKFAST., Disp: 30 tablet, Rfl: 4   LANTUS SOLOSTAR 100 UNIT/ML Solostar Pen, Inject 50 Units into the skin daily as needed (blood glucose >150)., Disp: 15 mL, Rfl: 6   metFORMIN (GLUCOPHAGE) 1000 MG tablet, TAKE 1 TABLET BY MOUTH TWICE A DAY, Disp: 180 tablet, Rfl: 1   nitroGLYCERIN (NITROSTAT) 0.4 MG SL tablet, Place 1 tablet (0.4 mg total) under the tongue every 5 (five) minutes as needed. Maximum of 3 doses., Disp: 25 tablet, Rfl: 3   omeprazole (PRILOSEC) 20 MG capsule, Take 1 capsule (20 mg total) by mouth daily. (Patient not taking: Reported on 02/02/2022), Disp: 90 capsule, Rfl: 3   quinapril (ACCUPRIL) 40 MG tablet, Take 1 tablet (40 mg total) by mouth daily., Disp: 90 tablet, Rfl: 5   torsemide (DEMADEX) 10 MG tablet, TAKE 1 TABLET BY MOUTH EVERY DAY, Disp: 90 tablet, Rfl: 0   Allergies  Allergen Reactions   Ampicillin Hives   Penicillins Hives and Swelling    ROS Review of Systems  Constitutional: Negative.   HENT: Negative.    Eyes: Negative.   Respiratory: Negative.    Cardiovascular: Negative.   Gastrointestinal: Negative.   Endocrine: Negative.   Genitourinary: Negative.   Musculoskeletal:  Positive for back pain.       Fracture of lumber 2while lifting piano  Skin: Negative.   Allergic/Immunologic: Negative.   Neurological: Negative.   Hematological: Negative.   Psychiatric/Behavioral: Negative.    All other systems reviewed and are negative.     Objective:     Physical Exam Vitals reviewed.  Constitutional:      Appearance: Normal appearance.  HENT:     Mouth/Throat:     Mouth: Mucous membranes are moist.  Eyes:     Pupils: Pupils are equal, round, and reactive to light.  Neck:     Vascular: No  carotid bruit.  Cardiovascular:     Rate and Rhythm: Normal rate and regular rhythm.     Pulses: Normal pulses.     Heart sounds: Normal heart sounds.  Pulmonary:     Effort: Pulmonary effort is normal.     Breath sounds: Normal breath sounds.  Abdominal:     General: Bowel sounds are normal.     Palpations: Abdomen is soft. There is no hepatomegaly, splenomegaly or mass.     Tenderness: There is no abdominal tenderness.     Hernia: No hernia is present.  Musculoskeletal:     Cervical back: Neck supple.     Right lower leg: No edema.     Left lower leg: No edema.     Comments: Fracture of lumber 2 while lifting piano  Skin:    Findings: No rash.  Neurological:     Mental Status: He is alert and oriented to person, place, and time.     Motor: No weakness.  Psychiatric:        Mood and Affect: Mood normal.        Behavior: Behavior normal.     BP (!) 168/84   Pulse 89   Ht '5\' 6"'  (1.676 m)   Wt 169 lb 11.2 oz (77 kg)   BMI 27.39 kg/m  Wt Readings from Last 3 Encounters:  05/10/22 169 lb 11.2 oz (77 kg)  05/09/22 165 lb (74.8 kg)  02/08/22 164 lb 8 oz (74.6 kg)     Health Maintenance Due  Topic Date Due   Hepatitis C Screening  Never done   Zoster Vaccines- Shingrix (1 of 2) Never done   COLONOSCOPY (Pts 45-35yr Insurance coverage will need to be confirmed)  Never done   TETANUS/TDAP  06/05/2019   COVID-19 Vaccine (4 - Moderna risk series) 09/10/2020   INFLUENZA VACCINE  04/05/2022    There are no preventive care reminders to display for this patient.  Lab Results  Component Value Date   TSH 0.958 09/14/2016   Lab Results  Component Value Date   WBC 7.9 12/25/2021   HGB 16.2 12/25/2021   HCT 48.5 12/25/2021    MCV 88.2 12/25/2021   PLT 183 12/25/2021   Lab Results  Component Value Date   NA 138 12/25/2021   K 4.6 12/25/2021   CO2 21 (L) 12/25/2021   GLUCOSE 234 (H) 12/25/2021   BUN 15 12/25/2021   CREATININE 0.88 12/25/2021   BILITOT 0.8 12/25/2021   ALKPHOS 60 12/25/2021   AST 32 12/25/2021   ALT 27 12/25/2021   PROT 7.7 12/25/2021   ALBUMIN 4.1 12/25/2021   CALCIUM 9.2 12/25/2021   ANIONGAP 13 12/25/2021   EGFR 73 08/03/2021   Lab Results  Component Value Date   CHOL 124 12/26/2021   Lab Results  Component Value Date   HDL 26 (L) 12/26/2021   Lab Results  Component Value Date   LDLCALC 59 12/26/2021   Lab Results  Component Value Date   TRIG 197 (H) 12/26/2021   Lab Results  Component Value Date   CHOLHDL 4.8 12/26/2021   Lab Results  Component Value Date   HGBA1C 7.5 05/10/2022      Assessment & Plan:   Problem List Items Addressed This Visit       Cardiovascular and Mediastinum   Hypertension     Patient denies any chest pain or shortness of breath there is no history of palpitation or paroxysmal nocturnal dyspnea   patient was  advised to follow low-salt low-cholesterol diet    ideally I want to keep systolic blood pressure below 130 mmHg, patient was asked to check blood pressure one times a week and give me a report on that.  Patient will be follow-up in 3 months  or earlier as needed, patient will call me back for any change in the cardiovascular symptoms Patient was advised to buy a book from local bookstore concerning blood pressure and read several chapters  every day.  This will be supplemented by some of the material we will give him from the office.  Patient should also utilize other resources like YouTube and Internet to learn more about the blood pressure and the diet.      Essential hypertension    Blood pressure is labile, I asked the patient to stop taking Motrin and ibuprofen      Right thalamic stroke [facility])    Patient was advised not to  take any ibuprofen        Endocrine   Diabetes 1.5, managed as type 2 ([place]) - Primary    - The patient's blood sugar is labile on med. - The patient will continue the current treatment regimen.  - I encouraged the patient to regularly check blood sugar.  - I encouraged the patient to monitor diet. I encouraged the patient to eat low-carb and low-sugar to help prevent blood sugar spikes.  - I encouraged the patient to continue following their prescribed treatment plan for diabetes - I informed the patient to get help if blood sugar drops below 49m/dL, or if suddenly have trouble thinking clearly or breathing.  Patient was advised to buy a book on diabetes from a local bookstore or [place]  Patient should read 2 chapters every day to keep the motivation going, this is in addition to some of the materials we provided them from the office.  There are other resources on the Internet like YouTube and wilkipedia to get an education on the diabetes      Relevant Medications   LANTUS SOLOSTAR 100 UNIT/ML Solostar Pen   Other Relevant Orders   POCT glucose (manual entry) (Completed)   POCT HgB A1C (Completed)   Other Visit Diagnoses     Screening for colon cancer       Relevant Orders   Ambulatory referral to Gastroenterology       Meds ordered this encounter  Medications   LANTUS SOLOSTAR 100 UNIT/ML Solostar Pen    Sig: Inject 50 Units into the skin daily as needed (blood glucose >150).    Dispense:  15 mL    Refill:  6    Follow-up: No follow-ups on file.    JCletis Athens MD

## 2022-05-10 NOTE — Assessment & Plan Note (Signed)

## 2022-05-10 NOTE — Assessment & Plan Note (Signed)
Patient was advised not to take any ibuprofen

## 2022-05-10 NOTE — Assessment & Plan Note (Signed)
Blood pressure is labile, I asked the patient to stop taking Motrin and ibuprofen

## 2022-05-10 NOTE — Assessment & Plan Note (Signed)

## 2022-05-11 ENCOUNTER — Telehealth: Payer: Self-pay

## 2022-05-11 ENCOUNTER — Other Ambulatory Visit: Payer: Self-pay

## 2022-05-11 ENCOUNTER — Telehealth: Payer: Self-pay | Admitting: Cardiovascular Disease

## 2022-05-11 DIAGNOSIS — Z1211 Encounter for screening for malignant neoplasm of colon: Secondary | ICD-10-CM

## 2022-05-11 MED ORDER — CLENPIQ 10-3.5-12 MG-GM -GM/160ML PO SOLN: 1.0000 | Freq: Once | ORAL | 0 refills | Status: AC

## 2022-05-11 NOTE — Telephone Encounter (Signed)
Gastroenterology Pre-Procedure Review  Request Date: 06/06/22 Requesting Physician: Dr. Vicente Males  PATIENT REVIEW QUESTIONS: The patient responded to the following health history questions as indicated:    1. Are you having any GI issues? no 2. Do you have a personal history of Polyps? Unsure  3. Do you have a family history of Colon Cancer or Polyps? no 4. Diabetes Mellitus? yes (takes metformin advised to hold per guidelines 2 days before, and take half the usual dose of insulin the day before) 5. Joint replacements in the past 12 months?no 6. Major health problems in the past 3 months?no 7. Any artificial heart valves, MVP, or defibrillator?no    MEDICATIONS & ALLERGIES:    Patient reports the following regarding taking any anticoagulation/antiplatelet therapy:   Plavix, Coumadin, Eliquis, Xarelto, Lovenox, Pradaxa, Brilinta, or Effient? yes (plavix requests sent to dr Nathaniel Man) Aspirin? yes (81 mg daily)  Patient confirms/reports the following medications:  Current Outpatient Medications  Medication Sig Dispense Refill   ACCU-CHEK AVIVA PLUS test strip USE ONE STRIP DAILY AS DIRECTED 100 strip 2   aspirin 81 MG tablet Take 81 mg by mouth daily.     atenolol (TENORMIN) 25 MG tablet Take 1 tablet (25 mg total) by mouth daily. 90 tablet 3   atorvastatin (LIPITOR) 40 MG tablet Take 1 tablet (40 mg total) by mouth daily. 90 tablet 3   clopidogrel (PLAVIX) 75 MG tablet Take 1 tablet (75 mg total) by mouth daily. 90 tablet 3   ezetimibe (ZETIA) 10 MG tablet Take 1 tablet (10 mg total) by mouth daily. 90 tablet 3   glipiZIDE (GLUCOTROL XL) 2.5 MG 24 hr tablet TAKE 1 TABLET BY MOUTH EVERY DAY 90 tablet 3   Insulin Pen Needle (PEN NEEDLES) 33G X 4 MM MISC Use to administer insulin daily 30 each 6   isosorbide mononitrate (IMDUR) 120 MG 24 hr tablet Take 1 tablet (120 mg total) by mouth daily. 90 tablet 3   JARDIANCE 10 MG TABS tablet TAKE 1 TABLET BY MOUTH DAILY BEFORE BREAKFAST. 30 tablet 4    LANTUS SOLOSTAR 100 UNIT/ML Solostar Pen Inject 50 Units into the skin daily as needed (blood glucose >150). 15 mL 6   metFORMIN (GLUCOPHAGE) 1000 MG tablet TAKE 1 TABLET BY MOUTH TWICE A DAY 180 tablet 1   nitroGLYCERIN (NITROSTAT) 0.4 MG SL tablet Place 1 tablet (0.4 mg total) under the tongue every 5 (five) minutes as needed. Maximum of 3 doses. 25 tablet 3   omeprazole (PRILOSEC) 20 MG capsule Take 1 capsule (20 mg total) by mouth daily. (Patient not taking: Reported on 02/02/2022) 90 capsule 3   quinapril (ACCUPRIL) 40 MG tablet Take 1 tablet (40 mg total) by mouth daily. 90 tablet 5   torsemide (DEMADEX) 10 MG tablet TAKE 1 TABLET BY MOUTH EVERY DAY 90 tablet 0   No current facility-administered medications for this visit.    Patient confirms/reports the following allergies:  Allergies  Allergen Reactions   Ampicillin Hives   Penicillins Hives and Swelling    No orders of the defined types were placed in this encounter.   AUTHORIZATION INFORMATION Primary Insurance: 1D#: Group #:  Secondary Insurance: 1D#: Group #:  SCHEDULE INFORMATION: Date:  Time: Location:

## 2022-05-11 NOTE — Telephone Encounter (Signed)
   Pre-operative Risk Assessment    Patient Name: Scott Doyle  DOB: 1960/10/18 MRN: [id number]{      Request for Surgical Clearance    Procedure:   COLONOSCOPY  Date of Surgery:  Clearance 06/06/22                               {  Surgeon:  NOT LISTED Surgeon's Group or Practice Name:  [facility] Phone number:  [phone] Fax number:  [phone]  Type of Clearance Requested:   - Medical    Type of Anesthesia:  General    Additional requests/questions:    SignedEli Phillips   05/11/2022, 12:44 PM

## 2022-05-11 NOTE — Telephone Encounter (Signed)
Left message to call back for tele pre op appt 

## 2022-05-11 NOTE — Telephone Encounter (Signed)
Primary Cardiologist:Muhammad Fletcher Anon, MD   Preoperative team, please contact this patient and set up a phone call appointment for further preoperative risk assessment. Please obtain consent and complete medication review. Thank you for your help.   I confirm that guidance regarding antiplatelet and oral anticoagulation therapy has been completed and, if necessary, noted below (none requested).   Emmaline Life, NP-C     05/11/2022, 1:22 PM [address], [address] Office [phone] Fax [phone]

## 2022-05-12 NOTE — Telephone Encounter (Signed)
Left message x 2 to call for tele pre op appt. I will send update to the requesting office that pt will need tele visit for pre op. We have left message x 2 for pt to call back.

## 2022-05-13 ENCOUNTER — Other Ambulatory Visit: Payer: Self-pay

## 2022-05-13 ENCOUNTER — Telehealth: Payer: Self-pay | Admitting: *Deleted

## 2022-05-13 MED ORDER — EMPAGLIFLOZIN 10 MG PO TABS: 10.0000 mg | ORAL_TABLET | Freq: Every day | ORAL | 1 refills | Status: DC

## 2022-05-13 NOTE — Telephone Encounter (Signed)
Pt is agreeable to plan of care for tele pre op appt 05/27/22 @ 9:20. Pt will need to hold Plavix. Med rec and consent are done.   Pt also asked for 90 dys on Plavix and Jardiance, he said Dr. Fletcher Anon fills these for him. I assured the pt that I will send a message to Dr. Tyrell Antonio nurse about medications mentioned above.      Patient Consent for Virtual Visit        Scott Doyle has provided verbal consent on 05/13/2022 for a virtual visit (video or telephone).   CONSENT FOR VIRTUAL VISIT FOR:  Scott Doyle  By participating in this virtual visit I agree to the following:  I hereby voluntarily request, consent and authorize [facility] and its employed or contracted physicians, physician assistants, nurse practitioners or other licensed health care professionals (the Practitioner), to provide me with telemedicine health care services (the "Services") as deemed necessary by the treating Practitioner. I acknowledge and consent to receive the Services by the Practitioner via telemedicine. I understand that the telemedicine visit will involve communicating with the Practitioner through live audiovisual communication technology and the disclosure of certain medical information by electronic transmission. I acknowledge that I have been given the opportunity to request an in-person assessment or other available alternative prior to the telemedicine visit and am voluntarily participating in the telemedicine visit.  I understand that I have the right to withhold or withdraw my consent to the use of telemedicine in the course of my care at any time, without affecting my right to future care or treatment, and that the Practitioner or I may terminate the telemedicine visit at any time. I understand that I have the right to inspect all information obtained and/or recorded in the course of the telemedicine visit and may receive copies of available information for a reasonable fee.  I understand that  some of the potential risks of receiving the Services via telemedicine include:  Delay or interruption in medical evaluation due to technological equipment failure or disruption; Information transmitted may not be sufficient (e.g. poor resolution of images) to allow for appropriate medical decision making by the Practitioner; and/or  In rare instances, security protocols could fail, causing a breach of personal health information.  Furthermore, I acknowledge that it is my responsibility to provide information about my medical history, conditions and care that is complete and accurate to the best of my ability. I acknowledge that Practitioner's advice, recommendations, and/or decision may be based on factors not within their control, such as incomplete or inaccurate data provided by me or distortions of diagnostic images or specimens that may result from electronic transmissions. I understand that the practice of medicine is not an exact science and that Practitioner makes no warranties or guarantees regarding treatment outcomes. I acknowledge that a copy of this consent can be made available to me via my patient portal ([facility]), or I can request a printed copy by calling the office of [place].    I understand that my insurance will be billed for this visit.   I have read or had this consent read to me. I understand the contents of this consent, which adequately explains the benefits and risks of the Services being provided via telemedicine.  I have been provided ample opportunity to ask questions regarding this consent and the Services and have had my questions answered to my satisfaction. I give my informed consent for the services to be provided  through the use of telemedicine in my medical care

## 2022-05-13 NOTE — Telephone Encounter (Signed)
Pt is agreeable to plan of care for tele pre op appt 05/27/22 @ 9:20. Pt will need to hold Plavix. Med rec and consent are done.   Pt also asked for 90 dys on Plavix and Jardiance, he said Dr. Fletcher Anon fills these for him. I assured the pt that I will send a message to Dr. Tyrell Antonio nurse about medications mentioned above.

## 2022-05-16 ENCOUNTER — Telehealth: Payer: Self-pay | Admitting: Neurosurgery

## 2022-05-16 NOTE — Telephone Encounter (Signed)
Patient was seen at [facility] on 05/09/2022 for low back pain and compression fracture. Patient has been contacted 3 times and left 3 messages to call the office for an appointment. If he calls back he needs to follow-up in 2-3 weeks with xrays and will see Geronimo Boot PA.

## 2022-05-17 ENCOUNTER — Ambulatory Visit: Payer: Medicare HMO | Admitting: Internal Medicine

## 2022-05-17 ENCOUNTER — Encounter: Payer: Self-pay | Admitting: Internal Medicine

## 2022-05-17 VITALS — BP 132/82 | HR 66 | Ht 66.0 in | Wt 169.7 lb

## 2022-05-17 DIAGNOSIS — E785 Hyperlipidemia, unspecified: Secondary | ICD-10-CM | POA: Diagnosis not present

## 2022-05-17 DIAGNOSIS — I10 Essential (primary) hypertension: Secondary | ICD-10-CM | POA: Diagnosis not present

## 2022-05-17 DIAGNOSIS — E139 Other specified diabetes mellitus without complications: Secondary | ICD-10-CM | POA: Diagnosis not present

## 2022-05-17 DIAGNOSIS — Z8673 Personal history of transient ischemic attack (TIA), and cerebral infarction without residual deficits: Secondary | ICD-10-CM | POA: Diagnosis not present

## 2022-05-17 DIAGNOSIS — S32010G Wedge compression fracture of first lumbar vertebra, subsequent encounter for fracture with delayed healing: Secondary | ICD-10-CM

## 2022-05-17 DIAGNOSIS — Z8781 Personal history of (healed) traumatic fracture: Secondary | ICD-10-CM | POA: Insufficient documentation

## 2022-05-17 DIAGNOSIS — F250 Schizoaffective disorder, bipolar type: Secondary | ICD-10-CM | POA: Diagnosis not present

## 2022-05-17 DIAGNOSIS — I6381 Other cerebral infarction due to occlusion or stenosis of small artery: Secondary | ICD-10-CM

## 2022-05-17 LAB — GLUCOSE, POCT (MANUAL RESULT ENTRY): POC Glucose: 171 mg/dl — AB (ref 70–99)

## 2022-05-17 MED ORDER — PEN NEEDLES 33G X 4 MM MISC: 6 refills | Status: AC

## 2022-05-17 NOTE — Assessment & Plan Note (Signed)

## 2022-05-17 NOTE — Assessment & Plan Note (Signed)
Refer to neurology for further work-up and treatment plan

## 2022-05-17 NOTE — Assessment & Plan Note (Signed)
Hypercholesterolemia  I advised the patient to follow Mediterranean diet This diet is rich in fruits vegetables and whole grain, and This diet is also rich in fish and lean meat Patient should also eat a handful of almonds or walnuts daily Recent heart study indicated that average follow-up on this kind of diet reduces the cardiovascular mortality by 50 to 70%== 

## 2022-05-17 NOTE — Assessment & Plan Note (Signed)
Patient is doing well blood pressure is under control

## 2022-05-17 NOTE — Assessment & Plan Note (Signed)

## 2022-05-17 NOTE — Assessment & Plan Note (Signed)
Stable at the present time. 

## 2022-05-17 NOTE — Progress Notes (Signed)
Established Patient Office Visit  Subjective:  Patient ID: Scott Doyle, male    DOB: 1961-07-16  Age: 61 y.o. MRN: [id number]  CC:  Chief Complaint  Patient presents with   Hypertension   Diabetes    Hypertension  Diabetes    [name] presents for check up/ past h/o  L2 compression fracture  Past Medical History:  Diagnosis Date   Anxiety    CHF (congestive heart failure) ([place])    Coronary artery disease    a. s/p prior CABG x 1 (LIMA->LAD) and PCI to the D2; b. 01/2012 PCI: patent prox Diag stent w/ sev distal dzs->DES x 1; c. 08/2014 Cath: LM nl, LAD 42m D2 patent stents w/ 312mLCX nl, RCA 40p, LIMA->LAD nl, EF 60%.   [place]   Diabetes mellitus    Hyperlipidemia    Hypertension    [facility]    Past Surgical History:  Procedure Laterality Date   CARDIAC CATHETERIZATION     CARDIAC CATHETERIZATION  08/2014   Patent LIMA to LAD and patent diagonal stents.   CORONARY ARTERY BYPASS GRAFT  2001   [facility]    Family History  Problem Relation Age of Onset   Hypertension Mother    Heart disease Mother    Diabetes Other        fx   Cancer Other        mat. family    Social History   Socioeconomic History   Marital status: Married    Spouse name: JoSHAHMEER BUNN Number of children: Not on file   Years of education: Not on file   Highest education level: GED or equivalent  Occupational History   Not on file  Tobacco Use   Smoking status: Former    Types: Cigarettes    Quit date: 2001    Years since quitting: 22.7   Smokeless tobacco: Never  Vaping Use   Vaping Use: Never used  Substance and Sexual Activity   Alcohol use: Yes    Comment: 1-2 glasses of wine occassionally   Drug use: No   Sexual activity: Not Currently  Other Topics Concern   Not on file  Social History Narrative   Not on file   Social Determinants of Health   Financial Resource Strain: Low Risk  (09/01/2021)   Overall  Financial Resource Strain (CARDIA)    Difficulty of Paying Living Expenses: Not very hard  Food Insecurity: No Food Insecurity (09/01/2021)   Hunger Vital Sign    Worried About Running Out of Food in the Last Year: Never true    RaMerigoldn the Last Year: Never true  Transportation Needs: No Transportation Needs (09/01/2021)   PRAPARE - TrHydrologistMedical): No    Lack of Transportation (Non-Medical): No  Physical Activity: Sufficiently Active (09/01/2021)   Exercise Vital Sign    Days of Exercise per Week: 5 days    Minutes of Exercise per Session: 30 min  Stress: No Stress Concern Present (09/01/2021)   [place]  Feeling of Stress : Not at all  Social Connections: SoAccord12/28/2022)   Social Connection and Isolation Panel [NHANES]    Frequency of Communication with Friends and Family: More than three times a week    Frequency of Social Gatherings with Friends and Family: More than three times a week  Attends Religious Services: More than 4 times per year    Active Member of Clubs or Organizations: Yes    Attends Archivist Meetings: More than 4 times per year    Marital Status: Married  Human resources officer Violence: Not At Risk (09/01/2021)   Humiliation, Afraid, Rape, and Kick questionnaire    Fear of Current or Ex-Partner: No    Emotionally Abused: No    Physically Abused: No    Sexually Abused: No     Current Outpatient Medications:    ACCU-CHEK AVIVA PLUS test strip, USE ONE STRIP DAILY AS DIRECTED, Disp: 100 strip, Rfl: 2   aspirin 81 MG tablet, Take 81 mg by mouth daily., Disp: , Rfl:    atenolol (TENORMIN) 25 MG tablet, Take 1 tablet (25 mg total) by mouth daily., Disp: 90 tablet, Rfl: 3   atorvastatin (LIPITOR) 40 MG tablet, Take 1 tablet (40 mg total) by mouth daily., Disp: 90 tablet, Rfl: 3   clopidogrel (PLAVIX) 75 MG tablet, Take 1  tablet (75 mg total) by mouth daily., Disp: 90 tablet, Rfl: 3   empagliflozin (JARDIANCE) 10 MG TABS tablet, Take 1 tablet (10 mg total) by mouth daily before breakfast., Disp: 90 tablet, Rfl: 1   ezetimibe (ZETIA) 10 MG tablet, Take 1 tablet (10 mg total) by mouth daily., Disp: 90 tablet, Rfl: 3   glipiZIDE (GLUCOTROL XL) 2.5 MG 24 hr tablet, TAKE 1 TABLET BY MOUTH EVERY DAY, Disp: 90 tablet, Rfl: 3   Insulin Pen Needle (PEN NEEDLES) 33G X 4 MM MISC, Use to administer insulin daily, Disp: 30 each, Rfl: 6   isosorbide mononitrate (IMDUR) 120 MG 24 hr tablet, Take 1 tablet (120 mg total) by mouth daily., Disp: 90 tablet, Rfl: 3   LANTUS SOLOSTAR 100 UNIT/ML Solostar Pen, Inject 50 Units into the skin daily as needed (blood glucose >150)., Disp: 15 mL, Rfl: 6   metFORMIN (GLUCOPHAGE) 1000 MG tablet, TAKE 1 TABLET BY MOUTH TWICE A DAY, Disp: 180 tablet, Rfl: 1   nitroGLYCERIN (NITROSTAT) 0.4 MG SL tablet, Place 1 tablet (0.4 mg total) under the tongue every 5 (five) minutes as needed. Maximum of 3 doses., Disp: 25 tablet, Rfl: 3   omeprazole (PRILOSEC) 20 MG capsule, Take 1 capsule (20 mg total) by mouth daily. (Patient not taking: Reported on 02/02/2022), Disp: 90 capsule, Rfl: 3   quinapril (ACCUPRIL) 40 MG tablet, Take 1 tablet (40 mg total) by mouth daily., Disp: 90 tablet, Rfl: 5   torsemide (DEMADEX) 10 MG tablet, TAKE 1 TABLET BY MOUTH EVERY DAY, Disp: 90 tablet, Rfl: 0   Allergies  Allergen Reactions   Ampicillin Hives   Penicillins Hives and Swelling    ROS Review of Systems  Constitutional: Negative.   HENT: Negative.    Eyes: Negative.   Respiratory: Negative.    Cardiovascular: Negative.   Gastrointestinal: Negative.   Endocrine: Negative.   Genitourinary: Negative.   Musculoskeletal: Negative.   Skin: Negative.   Allergic/Immunologic: Negative.   Neurological: Negative.   Hematological: Negative.   Psychiatric/Behavioral: Negative.    All other systems reviewed and are  negative.     Objective:    Physical Exam Vitals reviewed.  Constitutional:      Appearance: Normal appearance.  HENT:     Mouth/Throat:     Mouth: Mucous membranes are moist.  Eyes:     Pupils: Pupils are equal, round, and reactive to light.  Neck:     Vascular: No carotid bruit.  Cardiovascular:     Rate and Rhythm: Normal rate and regular rhythm.     Pulses: Normal pulses.     Heart sounds: Normal heart sounds.  Pulmonary:     Effort: Pulmonary effort is normal.     Breath sounds: Normal breath sounds.  Abdominal:     General: Bowel sounds are normal.     Palpations: Abdomen is soft. There is no hepatomegaly, splenomegaly or mass.     Tenderness: There is no abdominal tenderness.     Hernia: No hernia is present.  Musculoskeletal:     Cervical back: Neck supple.     Right lower leg: No edema.     Left lower leg: No edema.  Skin:    Findings: No rash.  Neurological:     Mental Status: He is alert and oriented to person, place, and time.     Motor: No weakness.  Psychiatric:        Mood and Affect: Mood normal.        Behavior: Behavior normal.     BP 132/82   Pulse 66   Ht '5\' 6"'  (1.676 m)   Wt 169 lb 11.2 oz (77 kg)   BMI 27.39 kg/m  Wt Readings from Last 3 Encounters:  05/17/22 169 lb 11.2 oz (77 kg)  05/10/22 169 lb 11.2 oz (77 kg)  05/09/22 165 lb (74.8 kg)     Health Maintenance Due  Topic Date Due   FOOT EXAM  Never done   Hepatitis C Screening  Never done   Zoster Vaccines- Shingrix (1 of 2) Never done   COLONOSCOPY (Pts 45-71yr Insurance coverage will need to be confirmed)  Never done   TETANUS/TDAP  06/05/2019   COVID-19 Vaccine (4 - Moderna risk series) 09/10/2020   OPHTHALMOLOGY EXAM  01/07/2022   INFLUENZA VACCINE  04/05/2022    There are no preventive care reminders to display for this patient.  Lab Results  Component Value Date   TSH 0.958 09/14/2016   Lab Results  Component Value Date   WBC 7.9 12/25/2021   HGB 16.2  12/25/2021   HCT 48.5 12/25/2021   MCV 88.2 12/25/2021   PLT 183 12/25/2021   Lab Results  Component Value Date   NA 138 12/25/2021   K 4.6 12/25/2021   CO2 21 (L) 12/25/2021   GLUCOSE 234 (H) 12/25/2021   BUN 15 12/25/2021   CREATININE 0.88 12/25/2021   BILITOT 0.8 12/25/2021   ALKPHOS 60 12/25/2021   AST 32 12/25/2021   ALT 27 12/25/2021   PROT 7.7 12/25/2021   ALBUMIN 4.1 12/25/2021   CALCIUM 9.2 12/25/2021   ANIONGAP 13 12/25/2021   EGFR 73 08/03/2021   Lab Results  Component Value Date   CHOL 124 12/26/2021   Lab Results  Component Value Date   HDL 26 (L) 12/26/2021   Lab Results  Component Value Date   LDLCALC 59 12/26/2021   Lab Results  Component Value Date   TRIG 197 (H) 12/26/2021   Lab Results  Component Value Date   CHOLHDL 4.8 12/26/2021   Lab Results  Component Value Date   HGBA1C 7.5 05/10/2022      Assessment & Plan:   Problem List Items Addressed This Visit       Cardiovascular and Mediastinum   Right thalamic stroke ([facility]    Patient is doing well blood pressure is under control      Primary hypertension     Patient denies any chest  pain or shortness of breath there is no history of palpitation or paroxysmal nocturnal dyspnea   patient was advised to follow low-salt low-cholesterol diet    ideally I want to keep systolic blood pressure below 130 mmHg, patient was asked to check blood pressure one times a week and give me a report on that.  Patient will be follow-up in 3 months  or earlier as needed, patient will call me back for any change in the cardiovascular symptoms Patient was advised to buy a book from local bookstore concerning blood pressure and read several chapters  every day.  This will be supplemented by some of the material we will give him from the office.  Patient should also utilize other resources like YouTube and Internet to learn more about the blood pressure and the diet.        Endocrine   Diabetes 1.5, managed  as type 2 ([place]) - Primary    - The patient's blood sugar is labile on med. - The patient will continue the current treatment regimen.  - I encouraged the patient to regularly check blood sugar.  - I encouraged the patient to monitor diet. I encouraged the patient to eat low-carb and low-sugar to help prevent blood sugar spikes.  - I encouraged the patient to continue following their prescribed treatment plan for diabetes - I informed the patient to get help if blood sugar drops below 38m/dL, or if suddenly have trouble thinking clearly or breathing.  Patient was advised to buy a book on diabetes from a local bookstore or from [place]  Patient should read 2 chapters every day to keep the motivation going, this is in addition to some of the materials we provided them from the office.  There are other resources on the Internet like YouTube and wilkipedia to get an education on the diabetes      Relevant Orders   POCT glucose (manual entry) (Completed)     Musculoskeletal and Integument   Compression fracture of L1 lumbar vertebra, with delayed healing, subsequent encounter    Refer to neurology for further work-up and treatment plan        Other   Hyperlipidemia    Hypercholesterolemia  I advised the patient to follow Mediterranean diet This diet is rich in fruits vegetables and whole grain, and This diet is also rich in fish and lean meat Patient should also eat a handful of almonds or walnuts daily Recent heart study indicated that average follow-up on this kind of diet reduces the cardiovascular mortality by 50 to 70%==      Schizoaffective disorder, bipolar type ([place]    Stable at the present time       Meds ordered this encounter  Medications   Insulin Pen Needle (PEN NEEDLES) 33G X 4 MM MISC    Sig: Use to administer insulin daily    Dispense:  30 each    Refill:  6    Follow-up: No follow-ups on file.    JCletis Athens MD

## 2022-05-21 ENCOUNTER — Other Ambulatory Visit: Payer: Self-pay | Admitting: Physician Assistant

## 2022-05-23 ENCOUNTER — Other Ambulatory Visit: Payer: Self-pay

## 2022-05-23 DIAGNOSIS — S32020A Wedge compression fracture of second lumbar vertebra, initial encounter for closed fracture: Secondary | ICD-10-CM

## 2022-05-23 NOTE — Progress Notes (Unsigned)
Referring Physician:  Delman Kitten, [address] [place],  [place] [address]  Primary Physician:  Cletis Athens, MD  History of Present Illness: 05/24/2022 [name] is here today with a chief complaint of low back pain that started after he was lifting a piano when he felt a "pop" in his back.  He has been wearing the brace.  His pain has substantially improved since it initially occurred.  Past Surgery: denies  Scott Doyle has no symptoms of cervical myelopathy.  The symptoms are causing a significant impact on the patient's life.   Review of Systems:  A 10 point review of systems is negative, except for the pertinent positives and negatives detailed in the HPI.  Past Medical History: Past Medical History:  Diagnosis Date   Anxiety    CHF (congestive heart failure) ([place])    Coronary artery disease    a. s/p prior CABG x 1 (LIMA->LAD) and PCI to the D2; b. 01/2012 PCI: patent prox Diag stent w/ sev distal dzs->DES x 1; c. 08/2014 Cath: LM nl, LAD 74m D2 patent stents w/ 343mLCX nl, RCA 40p, LIMA->LAD nl, EF 60%.   [place]) ([place]   Diabetes mellitus    Hyperlipidemia    Hypertension    [place] ([place]    Past Surgical History: Past Surgical History:  Procedure Laterality Date   CARDIAC CATHETERIZATION     CARDIAC CATHETERIZATION  08/2014   Patent LIMA to LAD and patent diagonal stents.   CORONARY ARTERY BYPASS GRAFT  2001   [facility]    Allergies: Allergies as of 05/24/2022 - Review Complete 05/17/2022  Allergen Reaction Noted   Ampicillin Hives 08/11/2014   Penicillins Hives and Swelling 01/02/2012    Medications: Current Meds  Medication Sig   ACCU-CHEK AVIVA PLUS test strip USE ONE STRIP DAILY AS DIRECTED   aspirin 81 MG tablet Take 81 mg by mouth daily.   atenolol (TENORMIN) 25 MG tablet Take 1 tablet (25 mg total) by mouth daily.   atorvastatin (LIPITOR) 40 MG tablet Take 1 tablet (40 mg total) by mouth  daily.   clopidogrel (PLAVIX) 75 MG tablet Take 1 tablet (75 mg total) by mouth daily.   empagliflozin (JARDIANCE) 10 MG TABS tablet Take 1 tablet (10 mg total) by mouth daily before breakfast.   ezetimibe (ZETIA) 10 MG tablet TAKE 1 TABLET BY MOUTH EVERY DAY   glipiZIDE (GLUCOTROL XL) 2.5 MG 24 hr tablet TAKE 1 TABLET BY MOUTH EVERY DAY   Insulin Pen Needle (PEN NEEDLES) 33G X 4 MM MISC Use to administer insulin daily   isosorbide mononitrate (IMDUR) 120 MG 24 hr tablet Take 1 tablet (120 mg total) by mouth daily.   LANTUS SOLOSTAR 100 UNIT/ML Solostar Pen Inject 50 Units into the skin daily as needed (blood glucose >150).   metFORMIN (GLUCOPHAGE) 1000 MG tablet TAKE 1 TABLET BY MOUTH TWICE A DAY   nitroGLYCERIN (NITROSTAT) 0.4 MG SL tablet Place 1 tablet (0.4 mg total) under the tongue every 5 (five) minutes as needed. Maximum of 3 doses.   omeprazole (PRILOSEC) 20 MG capsule Take 1 capsule (20 mg total) by mouth daily.   quinapril (ACCUPRIL) 40 MG tablet Take 1 tablet (40 mg total) by mouth daily.   torsemide (DEMADEX) 10 MG tablet TAKE 1 TABLET BY MOUTH EVERY DAY    Social History: Social History   Tobacco Use   Smoking status: Former    Types: Cigarettes  Quit date: 2001    Years since quitting: 22.7   Smokeless tobacco: Never  Vaping Use   Vaping Use: Never used  Substance Use Topics   Alcohol use: Yes    Comment: 1-2 glasses of wine occassionally   Drug use: No    Family Medical History: Family History  Problem Relation Age of Onset   Hypertension Mother    Heart disease Mother    Diabetes Other        fx   Cancer Other        mat. family    Physical Examination: There were no vitals filed for this visit.  General: Patient is well developed, well nourished, calm, collected, and in no apparent distress. Attention to examination is appropriate.  Neck:   Supple.  Full range of motion.  Respiratory: Patient is breathing without any difficulty.   NEUROLOGICAL:      Awake, alert, oriented to person, place, and time.  Speech is clear and fluent. Fund of knowledge is appropriate.   Cranial Nerves: Pupils equal round and reactive to light.  Facial tone is symmetric.  Facial sensation is symmetric. Shoulder shrug is symmetric. Tongue protrusion is midline.  There is no pronator drift.  ROM of spine: Brace.    Strength: Side Biceps Triceps Deltoid Interossei Grip Wrist Ext. Wrist Flex.  R '5 5 5 5 5 5 5  '$ L '5 5 5 5 5 5 5   '$ Side Iliopsoas Quads Hamstring PF DF EHL  R '5 5 5 5 5 5  '$ L '5 5 5 5 5 5   '$ Reflexes are 1+ and symmetric at the biceps, triceps, brachioradialis, patella and achilles.   Hoffman's is absent.  Clonus is not present.  Toes are down-going.  Bilateral upper and lower extremity sensation is intact to light touch.    No evidence of dysmetria noted.  Gait is normal.    Medical Decision Making  Imaging: CT L spine 05/09/22 IMPRESSION: Acute compression fracture of L2 with 20% height loss. No bony retropulsion.   Multilevel degenerative disc disease and facet arthropathy, worst at L4-L5 where there is severe left and moderate-severe right neural foraminal stenosis.     Electronically Signed   By: Maurine Simmering M.D.   On: 05/09/2022 13:59  Lumbar spine x-rays from May 24, 2022 show no change in alignment    I have personally reviewed the images and agree with the above interpretation.  Assessment and Plan: [name] is a pleasant 62 y.o. male with L2 compression fracture.  He has been improving.  He has been compliant with his brace.  He will continue to wear his brace.  We will see him back in 4 to 6 weeks with x-rays.  If those x-rays are stable, he will then have clearing x-rays approximately 3 months out from his injury.   I spent a total of 30 minutes in face-to-face and non-face-to-face activities related to this patient's care today.  Thank you for involving me in the care of this patient.      Alyssabeth Bruster K.  Izora Ribas MD, [facility]

## 2022-05-24 ENCOUNTER — Ambulatory Visit: Payer: Medicare HMO | Admitting: Neurosurgery

## 2022-05-24 ENCOUNTER — Ambulatory Visit
Admission: RE | Admit: 2022-05-24 | Discharge: 2022-05-24 | Disposition: A | Discharge status: Home / Self Care | Payer: Medicare HMO | Attending: Neurosurgery | Admitting: Neurosurgery

## 2022-05-24 ENCOUNTER — Ambulatory Visit
Admission: RE | Admit: 2022-05-24 | Discharge: 2022-05-24 | Disposition: A | Discharge status: Home / Self Care | Payer: Medicare HMO | Source: Ambulatory Visit | Attending: Neurosurgery | Admitting: Neurosurgery

## 2022-05-24 DIAGNOSIS — S32020D Wedge compression fracture of second lumbar vertebra, subsequent encounter for fracture with routine healing: Secondary | ICD-10-CM

## 2022-05-24 DIAGNOSIS — S32020A Wedge compression fracture of second lumbar vertebra, initial encounter for closed fracture: Secondary | ICD-10-CM | POA: Diagnosis not present

## 2022-05-24 DIAGNOSIS — M4126 Other idiopathic scoliosis, lumbar region: Secondary | ICD-10-CM | POA: Diagnosis not present

## 2022-05-27 ENCOUNTER — Ambulatory Visit: Payer: Medicare HMO | Attending: Cardiology | Admitting: Physician Assistant

## 2022-05-27 DIAGNOSIS — Z01810 Encounter for preprocedural cardiovascular examination: Secondary | ICD-10-CM | POA: Diagnosis not present

## 2022-05-27 NOTE — Telephone Encounter (Signed)
I s/w the pt and he apologized for missing his tele appt this morning. Said he had his ringer off and has been playing with his grandsons'.   Pt agreeable per Nicholes Rough, [facility] B for tele today at 2:40.

## 2022-05-27 NOTE — Progress Notes (Signed)
Virtual Visit via Telephone Note   Because of Scott Doyle co-morbid illnesses, he is at least at moderate risk for complications without adequate follow up.  This format is felt to be most appropriate for this patient at this time.  The patient did not have access to video technology/had technical difficulties with video requiring transitioning to audio format only (telephone).  All issues noted in this document were discussed and addressed.  No physical exam could be performed with this format.  Please refer to the patient's chart for his consent to telehealth for [facility].  Evaluation Performed:  Preoperative cardiovascular risk assessment _____________   Date:  05/27/2022   Patient ID:  Scott Doyle, DOB 1961-01-27, MRN [id number] Patient Location:  Home Provider location:   Office  Primary Care Provider:  Cletis Athens, MD Primary Cardiologist:  Kathlyn Sacramento, MD  Chief Complaint / Patient Profile   61 y.o. y/o male with a h/o CHF, CAD status post PCI of diagonal, hypertension, hyperlipidemia, and type 2 diabetes melitis who is pending colonoscopy and presents today for telephonic preoperative cardiovascular risk assessment.  Past Medical History    Past Medical History:  Diagnosis Date   Anxiety    CHF (congestive heart failure) ([place])    Coronary artery disease    a. s/p prior CABG x 1 (LIMA->LAD) and PCI to the D2; b. 01/2012 PCI: patent prox Diag stent w/ sev distal dzs->DES x 1; c. 08/2014 Cath: LM nl, LAD 27m D2 patent stents w/ 332mLCX nl, RCA 40p, LIMA->LAD nl, EF 60%.   [facility] (cere[facility] vascular accident) ([facility]   Diabetes mellitus    Hyperlipidemia    Hypertension    [facility]   Past Surgical History:  Procedure Laterality Date   CARDIAC CATHETERIZATION     CARDIAC CATHETERIZATION  08/2014   Patent LIMA to LAD and patent diagonal stents.   CORONARY ARTERY BYPASS GRAFT  2001   [facility]    Allergies  Allergies  Allergen  Reactions   Ampicillin Hives   Penicillins Hives and Swelling    History of Present Illness    Scott Doyle a [age].o. male who presents via audio/video conferencing for a telehealth visit today.  Pt was last seen in cardiology clinic on 02/02/2022 by Dr. RiVelva Harman At that time Scott Doyle doing well .  The patient is now pending procedure as outlined above. Since his last visit, he states that he is doing okay and has had no negative changes.  He denies chest pain, shortness of breath, and palpitations/racing heart rate.  He tells me he tried to lift a PNO and now has a compression fracture with a back brace.  However, he does not need surgery thankfully.  This clearance is specifically for his colonoscopy.  He tells me it is difficult standing for prolonged periods of time and bending down to pick up things off of the floor so he has a pickup stick to help.  He recently relocated and no longer has to do any yard work.  He enjoys playing the organ at church and is able to do all household tasks.  For this reason he is scored a 5.07 on the DASI.  This exceeds the minimum 4 METS requirement.  No medications indicated as needing help.  Of note, patient is on Plavix and aspirin.  Home Medications    Prior to Admission medications   Medication Sig Start Date End Date  Taking? Authorizing Provider  ACCU-CHEK AVIVA PLUS test strip USE ONE STRIP DAILY AS DIRECTED 11/26/21   Cletis Athens, MD  aspirin 81 MG tablet Take 81 mg by mouth daily.    [provider]  atenolol (TENORMIN) 25 MG tablet Take 1 tablet (25 mg total) by mouth daily. 10/21/21   Dunn, Areta Haber, PA-C  atorvastatin (LIPITOR) 40 MG tablet Take 1 tablet (40 mg total) by mouth daily. 10/21/21   Rise Mu, PA-C  clopidogrel (PLAVIX) 75 MG tablet Take 1 tablet (75 mg total) by mouth daily. 04/28/22   Cletis Athens, MD  empagliflozin (JARDIANCE) 10 MG TABS tablet Take 1 tablet (10 mg total) by mouth daily before breakfast. 05/13/22    Wellington Hampshire, MD  ezetimibe (ZETIA) 10 MG tablet TAKE 1 TABLET BY MOUTH EVERY DAY 05/23/22   Dunn, Areta Haber, PA-C  glipiZIDE (GLUCOTROL XL) 2.5 MG 24 hr tablet TAKE 1 TABLET BY MOUTH EVERY DAY 05/10/22   Cletis Athens, MD  Insulin Pen Needle (PEN NEEDLES) 33G X 4 MM MISC Use to administer insulin daily 05/17/22   Cletis Athens, MD  isosorbide mononitrate (IMDUR) 120 MG 24 hr tablet Take 1 tablet (120 mg total) by mouth daily. 10/21/21 10/16/22  Rise Mu, PA-C  LANTUS SOLOSTAR 100 UNIT/ML Solostar Pen Inject 50 Units into the skin daily as needed (blood glucose >150). 05/10/22   Cletis Athens, MD  metFORMIN (GLUCOPHAGE) 1000 MG tablet TAKE 1 TABLET BY MOUTH TWICE A DAY 01/31/22   Cletis Athens, MD  nitroGLYCERIN (NITROSTAT) 0.4 MG SL tablet Place 1 tablet (0.4 mg total) under the tongue every 5 (five) minutes as needed. Maximum of 3 doses. 12/11/20   Marrianne Mood D, PA-C  omeprazole (PRILOSEC) 20 MG capsule Take 1 capsule (20 mg total) by mouth daily. 04/16/20   Cletis Athens, MD  quinapril (ACCUPRIL) 40 MG tablet Take 1 tablet (40 mg total) by mouth daily. 10/21/21   Rise Mu, PA-C  torsemide (DEMADEX) 10 MG tablet TAKE 1 TABLET BY MOUTH EVERY DAY 04/25/22   Wellington Hampshire, MD    Physical Exam    Vital Signs:  [name] does not have vital signs available for review today.  Given telephonic nature of communication, physical exam is limited. AAOx3. NAD. Normal affect.  Speech and respirations are unlabored.  Accessory Clinical Findings    None  Assessment & Plan    1.  Preoperative Cardiovascular Risk Assessment:  Scott Doyle perioperative risk of a major cardiac event is 6.6% according to the Revised Cardiac Risk Index (RCRI).  Therefore, he is at high risk for perioperative complications.   His functional capacity is good at 5.07 METs according to the [place] Activity Status Index (DASI). Recommendations: According to ACC/AHA guidelines, no further cardiovascular testing needed.   The patient may proceed to surgery at acceptable risk.    A copy of this note will be routed to requesting surgeon.  Time:   Today, I have spent 8 minutes with the patient with telehealth technology discussing medical history, symptoms, and management plan.     Elgie Collard, PA-C  05/27/2022, 2:38 PM

## 2022-05-30 ENCOUNTER — Other Ambulatory Visit: Payer: Self-pay

## 2022-05-30 NOTE — Progress Notes (Signed)
Cardiac Clearance received from [facility] cardiology.  Plavix request sent to Dr. Lavera Guise to advise.  Thanks, [place], [place]

## 2022-06-04 ENCOUNTER — Other Ambulatory Visit: Payer: Self-pay | Admitting: Internal Medicine

## 2022-06-04 DIAGNOSIS — Z1331 Encounter for screening for depression: Secondary | ICD-10-CM

## 2022-06-06 ENCOUNTER — Encounter
Admission: RE | Disposition: A | Discharge status: Home / Self Care | Payer: Self-pay | Source: Home / Self Care | Attending: Gastroenterology

## 2022-06-06 ENCOUNTER — Ambulatory Visit: Payer: No Typology Code available for payment source | Admitting: Certified Registered"

## 2022-06-06 ENCOUNTER — Ambulatory Visit
Admission: RE | Admit: 2022-06-06 | Discharge: 2022-06-06 | Disposition: A | Discharge status: Home / Self Care | Payer: No Typology Code available for payment source | Attending: Gastroenterology | Admitting: Gastroenterology

## 2022-06-06 ENCOUNTER — Encounter: Payer: Self-pay | Admitting: Gastroenterology

## 2022-06-06 DIAGNOSIS — E119 Type 2 diabetes mellitus without complications: Secondary | ICD-10-CM | POA: Diagnosis not present

## 2022-06-06 DIAGNOSIS — Z951 Presence of aortocoronary bypass graft: Secondary | ICD-10-CM | POA: Diagnosis not present

## 2022-06-06 DIAGNOSIS — Z8673 Personal history of transient ischemic attack (TIA), and cerebral infarction without residual deficits: Secondary | ICD-10-CM | POA: Insufficient documentation

## 2022-06-06 DIAGNOSIS — Z955 Presence of coronary angioplasty implant and graft: Secondary | ICD-10-CM | POA: Insufficient documentation

## 2022-06-06 DIAGNOSIS — I110 Hypertensive heart disease with heart failure: Secondary | ICD-10-CM | POA: Diagnosis not present

## 2022-06-06 DIAGNOSIS — D122 Benign neoplasm of ascending colon: Secondary | ICD-10-CM | POA: Insufficient documentation

## 2022-06-06 DIAGNOSIS — Z7985 Long-term (current) use of injectable non-insulin antidiabetic drugs: Secondary | ICD-10-CM | POA: Insufficient documentation

## 2022-06-06 DIAGNOSIS — Z1211 Encounter for screening for malignant neoplasm of colon: Secondary | ICD-10-CM | POA: Diagnosis not present

## 2022-06-06 DIAGNOSIS — Z7984 Long term (current) use of oral hypoglycemic drugs: Secondary | ICD-10-CM | POA: Diagnosis not present

## 2022-06-06 DIAGNOSIS — E785 Hyperlipidemia, unspecified: Secondary | ICD-10-CM | POA: Insufficient documentation

## 2022-06-06 DIAGNOSIS — I2510 Atherosclerotic heart disease of native coronary artery without angina pectoris: Secondary | ICD-10-CM | POA: Insufficient documentation

## 2022-06-06 DIAGNOSIS — I509 Heart failure, unspecified: Secondary | ICD-10-CM | POA: Insufficient documentation

## 2022-06-06 DIAGNOSIS — D126 Benign neoplasm of colon, unspecified: Secondary | ICD-10-CM | POA: Diagnosis not present

## 2022-06-06 DIAGNOSIS — I252 Old myocardial infarction: Secondary | ICD-10-CM | POA: Diagnosis not present

## 2022-06-06 DIAGNOSIS — D128 Benign neoplasm of rectum: Secondary | ICD-10-CM | POA: Insufficient documentation

## 2022-06-06 DIAGNOSIS — Z87891 Personal history of nicotine dependence: Secondary | ICD-10-CM | POA: Insufficient documentation

## 2022-06-06 DIAGNOSIS — Z794 Long term (current) use of insulin: Secondary | ICD-10-CM | POA: Insufficient documentation

## 2022-06-06 HISTORY — PX: COLONOSCOPY WITH PROPOFOL: SHX5780

## 2022-06-06 LAB — GLUCOSE, CAPILLARY: Glucose-Capillary: 174 mg/dL — ABNORMAL HIGH (ref 70–99)

## 2022-06-06 SURGERY — COLONOSCOPY WITH PROPOFOL
Anesthesia: General

## 2022-06-06 MED ORDER — PROPOFOL 500 MG/50ML IV EMUL
INTRAVENOUS | Status: DC | PRN
  Administered 2022-06-06: 150 ug/kg/min via INTRAVENOUS

## 2022-06-06 MED ORDER — LIDOCAINE HCL (CARDIAC) PF 100 MG/5ML IV SOSY
PREFILLED_SYRINGE | INTRAVENOUS | Status: DC | PRN
  Administered 2022-06-06: 50 mg via INTRAVENOUS

## 2022-06-06 MED ORDER — SODIUM CHLORIDE 0.9 % IV SOLN: INTRAVENOUS | Status: DC | PRN

## 2022-06-06 MED ORDER — EPHEDRINE SULFATE (PRESSORS) 50 MG/ML IJ SOLN: INTRAMUSCULAR | Status: DC | PRN

## 2022-06-06 MED ORDER — PROPOFOL 10 MG/ML IV BOLUS
INTRAVENOUS | Status: DC | PRN
  Administered 2022-06-06: 80 mg via INTRAVENOUS

## 2022-06-06 NOTE — Anesthesia Preprocedure Evaluation (Signed)
Anesthesia Evaluation  Patient identified by MRN, date of birth, ID band Patient awake    Reviewed: Allergy & Precautions, NPO status , Patient's Chart, lab work & pertinent test results  Airway Mallampati: III  TM Distance: >3 FB Neck ROM: full    Dental  (+) Upper Dentures, Partial Lower   Pulmonary neg pulmonary ROS, former smoker,    Pulmonary exam normal breath sounds clear to auscultation       Cardiovascular Exercise Tolerance: Good hypertension, Pt. on medications + CAD, + Past MI, + Cardiac Stents, +CHF and + DOE  negative cardio ROS Normal cardiovascular exam Rhythm:Regular     Neuro/Psych Anxiety CVA, No Residual Symptoms negative neurological ROS  negative psych ROS   GI/Hepatic negative GI ROS, Neg liver ROS,   Endo/Other  negative endocrine ROSdiabetes, Well Controlled, Type 1  Renal/GU negative Renal ROS  negative genitourinary   Musculoskeletal   Abdominal (+) + obese,   Peds negative pediatric ROS (+)  Hematology negative hematology ROS (+)   Anesthesia Other Findings Past Medical History: No date: Anxiety No date: CHF (congestive heart failure) ([facility]) No date: Coronary artery disease     Comment:  a. s/p prior CABG x 1 (LIMA->LAD) and PCI to the D2; b.               01/2012 PCI: patent prox Diag stent w/ sev distal dzs->DES              x 1; c. 08/2014 Cath: LM nl, LAD 63m D2 patent stents w/              336mLCX nl, RCA 40p, LIMA->LAD nl, EF 60%. No date: CV[place] (cerebral vascular accident) ([facility] date: Diabetes mellitus No date: Hyperlipidemia No date: Hypertension No [facility] Past Surgical History: No date: CARDIAC CATHETERIZATION 08/2014: CARDIAC CATHETERIZATION     Comment:  Patent LIMA to LAD and patent diagonal stents. 2001: CORONARY ARTERY BYPASS GRAFT     Comment:  [facility]     Reproductive/Obstetrics negative OB ROS                              Anesthesia Physical Anesthesia Plan  ASA: 3  Anesthesia Plan: General   Post-op Pain Management:    Induction: Intravenous  PONV Risk Score and Plan: Propofol infusion and TIVA  Airway Management Planned: Natural Airway  Additional Equipment:   Intra-op Plan:   Post-operative Plan:   Informed Consent: I have reviewed the patients History and Physical, chart, labs and discussed the procedure including the risks, benefits and alternatives for the proposed anesthesia with the patient or authorized representative who has indicated his/her understanding and acceptance.     Dental Advisory Given  Plan Discussed with: CRNA and Surgeon  Anesthesia Plan Comments:         Anesthesia Quick Evaluation

## 2022-06-06 NOTE — Anesthesia Procedure Notes (Signed)
Procedure Name: MAC Date/Time: 06/06/2022 8:27 AM  Performed by: Biagio Borg, CRNAPre-anesthesia Checklist: Patient identified, Emergency Drugs available, Suction available, Patient being monitored and Timeout performed Patient Re-evaluated:Patient Re-evaluated prior to induction Oxygen Delivery Method: Nasal cannula Induction Type: IV induction Placement Confirmation: positive ETCO2 and CO2 detector

## 2022-06-06 NOTE — Transfer of Care (Signed)
Immediate Anesthesia Transfer of Care Note  Patient: Scott Doyle  Procedure(s) Performed: COLONOSCOPY WITH PROPOFOL  Patient Location: PACU and Endoscopy Unit  Anesthesia Type:General  Level of Consciousness: drowsy  Airway & Oxygen Therapy: Patient Spontanous Breathing  Post-op Assessment: Report given to RN and Post -op Vital signs reviewed and stable  Post vital signs: Reviewed and stable  Last Vitals:  Vitals Value Taken Time  BP 94/47 06/06/22 0844  Temp 35.7 C 06/06/22 0844  Pulse 50 06/06/22 0844  Resp 18 06/06/22 0844  SpO2 97 % 06/06/22 0844    Last Pain:  Vitals:   06/06/22 0844  TempSrc: Temporal  PainSc: Asleep         Complications: No notable events documented.

## 2022-06-06 NOTE — Anesthesia Postprocedure Evaluation (Signed)
Anesthesia Post Note  Patient: Scott Doyle  Procedure(s) Performed: COLONOSCOPY WITH PROPOFOL  Patient location during evaluation: PACU Anesthesia Type: General Level of consciousness: awake and oriented Pain management: pain level controlled Vital Signs Assessment: post-procedure vital signs reviewed and stable Respiratory status: spontaneous breathing and respiratory function stable Cardiovascular status: stable Anesthetic complications: no   No notable events documented.   Last Vitals:  Vitals:   06/06/22 0904 06/06/22 0911  BP: 95/64 110/63  Pulse:    Resp: 16 20  Temp:    SpO2: 98%     Last Pain:  Vitals:   06/06/22 0904  TempSrc:   PainSc: 0-No pain                 VAN STAVEREN,Jaylenne Hamelin

## 2022-06-06 NOTE — Op Note (Signed)
[facility] Gastroenterology Patient Name: Scott Doyle Procedure Date: 06/06/2022 7:57 AM MRN: [id number] Account #: [id number] Date of Birth: Apr 26, 1961 Admit Type: Outpatient Age: 61 [facility] 1 Gender: Male Note Status: Finalized Instrument Name: Jasper Riling [id number] Procedure:             Colonoscopy Indications:           Screening for colorectal malignant neoplasm Providers:             Jonathon Bellows MD, MD Referring MD:          Cletis Athens, MD (Referring MD) Medicines:             Monitored Anesthesia Care Complications:         No immediate complications. Procedure:             Pre-Anesthesia Assessment:                        - Prior to the procedure, a History and Physical was                         performed, and patient medications, allergies and                         sensitivities were reviewed. The patient's tolerance                         of previous anesthesia was reviewed.                        - The risks and benefits of the procedure and the                         sedation options and risks were discussed with the                         patient. All questions were answered and informed                         consent was obtained.                        - ASA Grade Assessment: II - A patient with mild                         systemic disease.                        After obtaining informed consent, the colonoscope was                         passed under direct vision. Throughout the procedure,                         the patient's blood pressure, pulse, and oxygen                         saturations were monitored continuously. The                         Colonoscope was introduced  through the anus and                         advanced to the the cecum, identified by the                         appendiceal orifice. The colonoscopy was performed                         with ease. The patient tolerated the procedure well.                          The quality of the bowel preparation was excellent. Findings:      A 6 mm polyp was found in the rectum. The polyp was sessile. The polyp       was removed with a cold snare. Resection and retrieval were complete.      Three sessile polyps were found in the ascending colon. The polyps were       6 to 8 mm in size. These polyps were removed with a cold snare.       Resection and retrieval were complete.      The exam was otherwise without abnormality on direct and retroflexion       views. Impression:            - One 6 mm polyp in the rectum, removed with a cold                         snare. Resected and retrieved.                        - Three 6 to 8 mm polyps in the ascending colon,                         removed with a cold snare. Resected and retrieved.                        - The examination was otherwise normal on direct and                         retroflexion views. Recommendation:        - Discharge patient to home (with escort).                        - Resume previous diet.                        - Continue present medications.                        - Await pathology results.                        - Repeat colonoscopy in 3 years for surveillance. Procedure Code(s):     --- Professional ---                        [phone], Colonoscopy, flexible; with removal of  tumor(s), polyp(s), or other lesion(s) by snare                         technique Diagnosis Code(s):     --- Professional ---                        Z12.11, Encounter for screening for malignant neoplasm                         of colon                        K62.1, Rectal polyp                        K63.5, Polyp of colon CPT copyright 2019 American Medical Association. All rights reserved. The codes documented in this report are preliminary and upon coder review may  be revised to meet current compliance requirements. Jonathon Bellows, MD Jonathon Bellows MD, MD 06/06/2022 8:43:13  AM This report has been signed electronically. Number of Addenda: 0 Note Initiated On: 06/06/2022 7:57 AM Scope Withdrawal Time: 0 hours 10 minutes 0 seconds  Total Procedure Duration: 0 hours 12 minutes 46 seconds  Estimated Blood Loss:  Estimated blood loss: none.      [facility]

## 2022-06-06 NOTE — H&P (Signed)
Scott Bellows, MD [address], [place], [place], [place], [address] [address], [place], [place], [place], [address] Phone: [phone]  Fax: [phone]  Primary Care Physician:  Scott Athens, MD   Pre-Procedure History & Physical: HPI:  Scott Doyle is a 61 y.o. male is here for an colonoscopy.   Past Medical History:  Diagnosis Date   Anxiety    CHF (congestive heart failure) ([place])    Coronary artery disease    a. s/p prior CABG x 1 (LIMA->LAD) and PCI to the D2; b. 01/2012 PCI: patent prox Diag stent w/ sev distal dzs->DES x 1; c. 08/2014 Cath: LM nl, LAD 101m D2 patent stents w/ 382mLCX nl, RCA 40p, LIMA->LAD nl, EF 60%.   [facility] ([facility] vascular accident) ([facility]   Diabetes mellitus    Hyperlipidemia    Hypertension    [facility]    Past Surgical History:  Procedure Laterality Date   CARDIAC CATHETERIZATION     CARDIAC CATHETERIZATION  08/2014   Patent LIMA to LAD and patent diagonal stents.   [place] [place]  Prior to Admission medications   Medication Sig Start Date End Date Taking? Authorizing Provider  ACCU-CHEK AVIVA PLUS test strip USE ONE STRIP DAILY AS DIRECTED 11/26/21   MaCletis AthensMD  aspirin 81 MG tablet Take 81 mg by mouth daily.    [provider]  atenolol (TENORMIN) 25 MG tablet Take 1 tablet (25 mg total) by mouth daily. 10/21/21   Dunn, RyAreta HaberPA-C  atorvastatin (LIPITOR) 40 MG tablet Take 1 tablet (40 mg total) by mouth daily. 10/21/21   DuRise MuPA-C  clopidogrel (PLAVIX) 75 MG tablet Take 1 tablet (75 mg total) by mouth daily. 04/28/22   MaCletis AthensMD  empagliflozin (JARDIANCE) 10 MG TABS tablet Take 1 tablet (10 mg total) by mouth daily before breakfast. 05/13/22   ArWellington HampshireMD  ezetimibe (ZETIA) 10 MG tablet TAKE 1 TABLET BY MOUTH EVERY DAY 05/23/22   Dunn, RyAreta HaberPA-C  glipiZIDE (GLUCOTROL XL) 2.5 MG 24 hr tablet TAKE 1 TABLET BY MOUTH EVERY DAY 05/10/22   MaCletis AthensMD  Insulin Pen Needle (PEN NEEDLES) 33G X 4 MM MISC Use to administer insulin daily 05/17/22   MaCletis AthensMD  isosorbide mononitrate (IMDUR) 120 MG 24 hr tablet Take 1 tablet (120 mg total) by mouth daily. 10/21/21 10/16/22  DuRise MuPA-C  LANTUS SOLOSTAR 100 UNIT/ML Solostar Pen Inject 50 Units into the skin daily as needed (blood glucose >150). 05/10/22   MaCletis AthensMD  metFORMIN (GLUCOPHAGE) 1000 MG tablet TAKE 1 TABLET BY MOUTH TWICE A DAY 01/31/22   MaCletis AthensMD  nitroGLYCERIN (NITROSTAT) 0.4 MG SL tablet Place 1 tablet (0.4 mg total) under the tongue every 5 (five) minutes as needed. Maximum of 3 doses. 12/11/20   ViMarrianne Mood, PA-C  omeprazole (PRILOSEC) 20 MG capsule Take 1 capsule (20 mg total) by mouth daily. 04/16/20   MaCletis AthensMD  quinapril (ACCUPRIL) 40 MG tablet Take 1 tablet (40 mg total) by mouth daily. 10/21/21   DuRise MuPA-C  torsemide (DEMADEX) 10 MG tablet TAKE 1 TABLET BY MOUTH EVERY DAY 04/25/22   ArWellington HampshireMD    Allergies as of 05/11/2022 - Review Complete 05/11/2022  Allergen Reaction Noted   Ampicillin Hives 08/11/2014   Penicillins Hives and Swelling 01/02/2012  Family History  Problem Relation Age of Onset   Hypertension Mother    Heart disease Mother    Diabetes Other        fx   Cancer Other        mat. family    Social History   Socioeconomic History   Marital status: Married    Spouse name: Scott Doyle   Number of children: Not on file   Years of education: Not on file   Highest education level: GED or equivalent  Occupational History   Not on file  Tobacco Use   Smoking status: Former    Types: Cigarettes    Quit date: 2001    Years since quitting: 22.7   Smokeless tobacco: Never  Vaping Use   Vaping Use: Never used  Substance and Sexual Activity   Alcohol use: Yes    Comment: 1-2 glasses of wine occassionally   Drug use: No   Sexual activity: Not Currently  Other Topics Concern   Not on  file  Social History Narrative   Not on file   Social Determinants of Health   Financial Resource Strain: Low Risk  (09/01/2021)   Overall Financial Resource Strain (CARDIA)    Difficulty of Paying Living Expenses: Not very hard  Food Insecurity: No Food Insecurity (09/01/2021)   Hunger Vital Sign    Worried About Running Out of Food in the Last Year: Never true    Ran Out of Food in the Last Year: Never true  Transportation Needs: No Transportation Needs (09/01/2021)   PRAPARE - Hydrologist (Medical): No    Lack of Transportation (Non-Medical): No  Physical Activity: Sufficiently Active (09/01/2021)   Exercise Vital Sign    Days of Exercise per Week: 5 days    Minutes of Exercise per Session: 30 min  Stress: No Stress Concern Present (09/01/2021)   [place]    Feeling of Stress : Not at all  Social Connections: [place] (09/01/2021)   Social Connection and Isolation Panel [NHANES]    Frequency of Communication with Friends and Family: More than three times a week    Frequency of Social Gatherings with Friends and Family: More than three times a week    Attends Religious Services: More than 4 times per year    Active Member of [employer] or Organizations: Yes    Attends Music therapist: More than 4 times per year    Marital Status: Married  Human resources officer Violence: Not At Risk (09/01/2021)   Humiliation, Afraid, Rape, and Kick questionnaire    Fear of Current or Ex-Partner: No    Emotionally Abused: No    Physically Abused: No    Sexually Abused: No    Review of Systems: See HPI, otherwise negative ROS  Physical Exam: There were no vitals taken for this visit. General:   Alert,  pleasant and cooperative in NAD Head:  Normocephalic and atraumatic. Neck:  Supple; no masses or thyromegaly. Lungs:  Clear throughout to auscultation, normal respiratory  effort.    Heart:  +S1, +S2, Regular rate and rhythm, No edema. Abdomen:  Soft, nontender and nondistended. Normal bowel sounds, without guarding, and without rebound.   Neurologic:  Alert and  oriented x4;  grossly normal neurologically.  Impression/Plan: Scott Doyle is here for an colonoscopy to be performed for Screening colonoscopy average risk   Risks, benefits, limitations, and alternatives regarding  colonoscopy have been reviewed with the patient.  Questions have been answered.  All parties agreeable.   Scott Bellows, MD  06/06/2022, 8:06 AM

## 2022-06-07 ENCOUNTER — Encounter: Payer: Self-pay | Admitting: Gastroenterology

## 2022-06-07 LAB — SURGICAL PATHOLOGY

## 2022-06-09 ENCOUNTER — Other Ambulatory Visit: Payer: Self-pay | Admitting: *Deleted

## 2022-06-09 MED ORDER — EZETIMIBE 10 MG PO TABS: 10.0000 mg | ORAL_TABLET | Freq: Every day | ORAL | 3 refills | Status: DC

## 2022-06-16 ENCOUNTER — Encounter: Payer: Self-pay | Admitting: Gastroenterology

## 2022-07-04 ENCOUNTER — Other Ambulatory Visit: Payer: Self-pay

## 2022-07-04 DIAGNOSIS — S32020D Wedge compression fracture of second lumbar vertebra, subsequent encounter for fracture with routine healing: Secondary | ICD-10-CM

## 2022-07-04 NOTE — Progress Notes (Signed)
Follow-up note: Referring Physician:  Corky Downs, MD [address] [place],  [place] [address]  Primary Physician:  Corky Downs, MD  Chief Complaint:  recheck L2 compression fracture  History of Present Illness: Scott Doyle is a 61 y.o. male who presents for follow up of L2 compression fracture s/p injury on 05/08/22 (lifting a piano).   Last seen by Dr. Myer Haff on 05/24/22. He was doing better and wearing his TLSO brace.   He is doing well with no back pain. No leg pain. No numbness, tingling, or weakness.   He is on PLAVIX.   Review of Systems:  A 10 point review of systems is negative and the pertinent positives and negatives detailed in the HPI.  Past Medical History: Past Medical History:  Diagnosis Date   Anxiety    CHF (congestive heart failure) (HCC)    Coronary artery disease    a. s/p prior CABG x 1 (LIMA->LAD) and PCI to the D2; b. 01/2012 PCI: patent prox Diag stent w/ sev distal dzs->DES x 1; c. 08/2014 Cath: LM nl, LAD 21m, D2 patent stents w/ 5m, LCX nl, RCA 40p, LIMA->LAD nl, EF 60%.   CVA (cerebral vascular accident) (HCC)    Diabetes mellitus    Hyperlipidemia    Hypertension    Thrombocytopathia (HCC)     Past Surgical History: Past Surgical History:  Procedure Laterality Date   CARDIAC CATHETERIZATION     CARDIAC CATHETERIZATION  08/2014   Patent LIMA to LAD and patent diagonal stents.   COLONOSCOPY WITH PROPOFOL N/A 06/06/2022   Procedure: COLONOSCOPY WITH PROPOFOL;  Surgeon: Wyline Mood, MD;  Location: [facility] ENDOSCOPY;  Service: Gastroenterology;  Laterality: N/A;   CORONARY ARTERY BYPASS GRAFT  2001   [facility]    Allergies: Allergies as of 07/05/2022 - Review Complete 07/05/2022  Allergen Reaction Noted   Ampicillin Hives 08/11/2014   Penicillins Hives and Swelling 01/02/2012    Medications: Outpatient Encounter Medications as of 07/05/2022  Medication Sig   ACCU-CHEK AVIVA PLUS test strip USE ONE STRIP DAILY AS DIRECTED   aspirin 81  MG tablet Take 81 mg by mouth daily.   atenolol (TENORMIN) 25 MG tablet Take 1 tablet (25 mg total) by mouth daily.   atorvastatin (LIPITOR) 40 MG tablet Take 1 tablet (40 mg total) by mouth daily.   clopidogrel (PLAVIX) 75 MG tablet Take 1 tablet (75 mg total) by mouth daily.   empagliflozin (JARDIANCE) 10 MG TABS tablet Take 1 tablet (10 mg total) by mouth daily before breakfast.   ezetimibe (ZETIA) 10 MG tablet Take 1 tablet (10 mg total) by mouth daily.   glipiZIDE (GLUCOTROL XL) 2.5 MG 24 hr tablet TAKE 1 TABLET BY MOUTH EVERY DAY   Insulin Pen Needle (PEN NEEDLES) 33G X 4 MM MISC Use to administer insulin daily   isosorbide mononitrate (IMDUR) 120 MG 24 hr tablet Take 1 tablet (120 mg total) by mouth daily.   LANTUS SOLOSTAR 100 UNIT/ML Solostar Pen Inject 50 Units into the skin daily as needed (blood glucose >150).   metFORMIN (GLUCOPHAGE) 1000 MG tablet TAKE 1 TABLET BY MOUTH TWICE A DAY   nitroGLYCERIN (NITROSTAT) 0.4 MG SL tablet Place 1 tablet (0.4 mg total) under the tongue every 5 (five) minutes as needed. Maximum of 3 doses.   omeprazole (PRILOSEC) 20 MG capsule Take 1 capsule (20 mg total) by mouth daily. (Patient not taking: Reported on 06/06/2022)   quinapril (ACCUPRIL) 40 MG tablet Take 1 tablet (40 mg  total) by mouth daily.   torsemide (DEMADEX) 10 MG tablet TAKE 1 TABLET BY MOUTH EVERY DAY   No facility-administered encounter medications on file as of 07/05/2022.    Social History: Social History   Tobacco Use   Smoking status: Former    Types: Cigarettes    Quit date: 2001    Years since quitting: 22.8   Smokeless tobacco: Never  Vaping Use   Vaping Use: Never used  Substance Use Topics   Alcohol use: Yes    Comment: 1-2 glasses of wine occassionally   Drug use: No    Family Medical History: Family History  Problem Relation Age of Onset   Hypertension Mother    Heart disease Mother    Diabetes Other        fx   Cancer Other        mat. family     Exam: Patient has normal gait.   He is wearing his TLSO brace. ROM not tested.   No tenderness over lumbar region.   Strength in the lower extremities:  Left EHL 5/5   Right EHL 5/5 Left Dorsiflexion 5/5  Right Dorsiflexion 5/5 Left Plantar flexion 5/5 Right Plantar flexion 5/5 Left Hamstring 5/5  Right Hamstring 5/5 Left Quadriceps 5/5  Right Quadriceps 5/5  Bilateral lower extremity sensation is intact to light touch.   Clonus is negative.    Imaging: Lumbar xrays dated 07/05/22:  Stable L2 compression fracture.   Radiology report not available for my review. Xrays reviewed with Dr. Myer Haff.   Assessment and Plan: Scott Doyle is a pleasant 61 y.o. male with  L2 compression fracture s/p injury on 05/08/22 (lifting a piano). He is doing well with no current pain.   Xrays show stable L2 compression fracture.   Treatment options discussed with patient and following plan made:   - Okay to d/c TLSO brace per Dr. Myer Haff. Recommend that he wean out of this- will let pain be his guide.  - No bending, twisting, or lifting. Reminded he cannot lift his grandkids (he watches them during the day).  - Follow up in 4-6 weeks for lumbar xrays with flexion/extension. If these look good, can follow up prn.   I spent a total of 20 minutes in both face-to-face and non-face-to-face activities for this visit on the date of this encounter including chart review, image review and discussing treatment options and plan of care with patient.   Drake Leach PA-C Neurosurgery

## 2022-07-05 ENCOUNTER — Encounter: Payer: Self-pay | Admitting: Orthopedic Surgery

## 2022-07-05 ENCOUNTER — Ambulatory Visit
Admission: RE | Admit: 2022-07-05 | Discharge: 2022-07-05 | Disposition: A | Discharge status: Home / Self Care | Payer: No Typology Code available for payment source | Attending: Orthopedic Surgery | Admitting: Orthopedic Surgery

## 2022-07-05 ENCOUNTER — Ambulatory Visit (INDEPENDENT_AMBULATORY_CARE_PROVIDER_SITE_OTHER): Payer: No Typology Code available for payment source | Admitting: Orthopedic Surgery

## 2022-07-05 ENCOUNTER — Ambulatory Visit
Admission: RE | Admit: 2022-07-05 | Discharge: 2022-07-05 | Disposition: A | Discharge status: Home / Self Care | Payer: No Typology Code available for payment source | Source: Ambulatory Visit | Attending: Orthopedic Surgery | Admitting: Orthopedic Surgery

## 2022-07-05 VITALS — BP 130/68 | Ht 66.0 in | Wt 165.0 lb

## 2022-07-05 DIAGNOSIS — S32020D Wedge compression fracture of second lumbar vertebra, subsequent encounter for fracture with routine healing: Secondary | ICD-10-CM

## 2022-07-05 DIAGNOSIS — M5136 Other intervertebral disc degeneration, lumbar region: Secondary | ICD-10-CM | POA: Diagnosis not present

## 2022-08-06 ENCOUNTER — Other Ambulatory Visit: Payer: Self-pay | Admitting: Cardiovascular Disease

## 2022-08-06 ENCOUNTER — Other Ambulatory Visit: Payer: Self-pay | Admitting: Physician Assistant

## 2022-08-10 NOTE — Progress Notes (Signed)
Follow-up note: Referring Physician:  Cletis Athens, MD [address] [place],  [place] [address]  Primary Physician:  Scott Athens, MD  History of Present Illness: Scott Doyle is a 61 y.o. male who presents for follow up of L2 compression fracture s/p injury on 05/08/22 (lifting a piano).   Last seen by me on 07/05/22. He was doing well with no pain. Per Dr. Izora Doyle, he could wean out of his TLSO brace. He was reminded to avoid bending, twisting, and lifting.   He is here for follow up and repeat xrays.   He has no LBP. No leg pain. No numbness, tingling, or weakness. He has weaned out of his brace as instructed at his last visit.   He is on PLAVIX.   Review of Systems:  A 10 point review of systems is negative and the pertinent positives and negatives detailed in the HPI.  Past Medical History: Past Medical History:  Diagnosis Date   Anxiety    CHF (congestive heart failure) ([place])    Coronary artery disease    a. s/p prior CABG x 1 (LIMA->LAD) and PCI to the D2; b. 01/2012 PCI: patent prox Diag stent w/ sev distal dzs->DES x 1; c. 08/2014 Cath: LM nl, LAD 58m D2 patent stents w/ 3102mLCX nl, RCA 40p, LIMA->LAD nl, EF 60%.   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   COLONOSCOPY WITH PROPOFOL N/A 06/06/2022   Procedure: COLONOSCOPY WITH PROPOFOL;  Surgeon: AnJonathon BellowsMD;  [facility];  Service: Gastroenterology;  Laterality: N/A;   CORONARY ARTERY BYPASS GRAFT  2001   [facility]    Allergies: Allergies as of 08/12/2022 - Review Complete 07/05/2022  Allergen Reaction Noted   Ampicillin Hives 08/11/2014   Penicillins Hives and Swelling 01/02/2012    Medications: Outpatient Encounter  Medications as of 08/12/2022  Medication Sig   ACCU-CHEK AVIVA PLUS test strip USE ONE STRIP DAILY AS DIRECTED   aspirin 81 MG tablet Take 81 mg by mouth daily.   atenolol (TENORMIN) 25 MG tablet Take 1 tablet (25 mg total) by mouth daily.   atorvastatin (LIPITOR) 40 MG tablet Take 1 tablet (40 mg total) by mouth daily.   clopidogrel (PLAVIX) 75 MG tablet Take 1 tablet (75 mg total) by mouth daily.   empagliflozin (JARDIANCE) 10 MG TABS tablet Take 1 tablet (10 mg total) by mouth daily before breakfast.   ezetimibe (ZETIA) 10 MG tablet Take 1 tablet (10 mg total) by mouth daily.   glipiZIDE (GLUCOTROL XL) 2.5 MG 24 hr tablet TAKE 1 TABLET BY MOUTH EVERY DAY   Insulin Pen Needle (PEN NEEDLES) 33G X 4 MM MISC Use to administer insulin daily   isosorbide mononitrate (IMDUR) 120 MG 24 hr tablet TAKE 1 TABLET BY MOUTH EVERY DAY   LANTUS SOLOSTAR 100 UNIT/ML Solostar Pen Inject 50 Units into the skin daily as needed (blood glucose >150).   metFORMIN (GLUCOPHAGE) 1000 MG tablet TAKE 1 TABLET BY MOUTH TWICE A DAY   nitroGLYCERIN (NITROSTAT) 0.4 MG SL tablet Place 1 tablet (0.4 mg total) under the tongue every 5 (five) minutes as needed. Maximum of 3 doses.   omeprazole (PRILOSEC) 20 MG capsule  Take 1 capsule (20 mg total) by mouth daily. (Patient not taking: Reported on 06/06/2022)   quinapril (ACCUPRIL) 40 MG tablet Take 1 tablet (40 mg total) by mouth daily.   torsemide (DEMADEX) 10 MG tablet TAKE 1 TABLET BY MOUTH EVERY DAY   No facility-administered encounter medications on file as of 08/12/2022.    Social History: Social History   Tobacco Use   Smoking status: Former    Types: Cigarettes    Quit date: 2001    Years since quitting: 22.9   Smokeless tobacco: Never  Vaping Use   Vaping Use: Never used  Substance Use Topics   Alcohol use: Yes    Comment: 1-2 glasses of wine occassionally   Drug use: No    Family Medical History: Family History  Problem Relation Age of Onset    Hypertension Mother    Heart disease Mother    Diabetes Other        fx   Cancer Other        mat. family    Exam: Patient has normal gait.   No tenderness over lumbar region.   Strength in the lower extremities:  Left EHL 5/5   Right EHL 5/5 Left Dorsiflexion 5/5  Right Dorsiflexion 5/5 Left Plantar flexion 5/5 Right Plantar flexion 5/5 Left Hamstring 5/5  Right Hamstring 5/5 Left Quadriceps 5/5  Right Quadriceps 5/5  Bilateral lower extremity sensation is intact to light touch.   Clonus is negative.    Imaging: Lumbar xrays dated 08/12/22:  Stable L2 compression fracture. No instability noted.   Radiology report not available for my review.    Assessment and Plan: [name] is a pleasant 61 y.o. male with  L2 compression fracture s/p injury on 05/08/22 (lifting a piano). He is doing well with no current pain.   Xrays show stable L2 compression fracture. No instability noted.   Treatment options discussed with patient and following plan made:   - He can slowly return to regular activities.  - Still will have care with lifting his grandkids.  - He will f/u prn.   I spent a total of 10 minutes in both face-to-face and non-face-to-face activities for this visit on the date of this encounter including chart review, image review and discussing treatment options and plan of care with patient.   Geronimo Boot PA-C Neurosurgery

## 2022-08-12 ENCOUNTER — Ambulatory Visit
Admission: RE | Admit: 2022-08-12 | Discharge: 2022-08-12 | Disposition: A | Discharge status: Home / Self Care | Payer: No Typology Code available for payment source | Attending: Orthopedic Surgery | Admitting: Orthopedic Surgery

## 2022-08-12 ENCOUNTER — Ambulatory Visit (INDEPENDENT_AMBULATORY_CARE_PROVIDER_SITE_OTHER): Payer: No Typology Code available for payment source | Admitting: Orthopedic Surgery

## 2022-08-12 ENCOUNTER — Encounter: Payer: Self-pay | Admitting: Orthopedic Surgery

## 2022-08-12 ENCOUNTER — Ambulatory Visit
Admission: RE | Admit: 2022-08-12 | Discharge: 2022-08-12 | Disposition: A | Discharge status: Home / Self Care | Payer: No Typology Code available for payment source | Source: Ambulatory Visit | Attending: Orthopedic Surgery | Admitting: Orthopedic Surgery

## 2022-08-12 VITALS — BP 128/68 | Ht 66.0 in | Wt 165.0 lb

## 2022-08-12 DIAGNOSIS — S32020D Wedge compression fracture of second lumbar vertebra, subsequent encounter for fracture with routine healing: Secondary | ICD-10-CM

## 2022-08-12 DIAGNOSIS — M4316 Spondylolisthesis, lumbar region: Secondary | ICD-10-CM | POA: Diagnosis not present

## 2022-08-16 ENCOUNTER — Ambulatory Visit
Payer: No Typology Code available for payment source | Attending: Cardiovascular Disease | Admitting: Cardiovascular Disease

## 2022-08-16 ENCOUNTER — Encounter: Payer: Self-pay | Admitting: Cardiovascular Disease

## 2022-08-16 VITALS — BP 160/68 | HR 56 | Ht 67.0 in | Wt 167.2 lb

## 2022-08-16 DIAGNOSIS — I779 Disorder of arteries and arterioles, unspecified: Secondary | ICD-10-CM

## 2022-08-16 DIAGNOSIS — I25118 Atherosclerotic heart disease of native coronary artery with other forms of angina pectoris: Secondary | ICD-10-CM

## 2022-08-16 DIAGNOSIS — I5032 Chronic diastolic (congestive) heart failure: Secondary | ICD-10-CM

## 2022-08-16 DIAGNOSIS — E119 Type 2 diabetes mellitus without complications: Secondary | ICD-10-CM | POA: Diagnosis not present

## 2022-08-16 DIAGNOSIS — E785 Hyperlipidemia, unspecified: Secondary | ICD-10-CM | POA: Diagnosis not present

## 2022-08-16 DIAGNOSIS — I10 Essential (primary) hypertension: Secondary | ICD-10-CM | POA: Diagnosis not present

## 2022-08-16 MED ORDER — CARVEDILOL 6.25 MG PO TABS: 6.2500 mg | ORAL_TABLET | Freq: Two times a day (BID) | ORAL | 3 refills | Status: DC

## 2022-08-16 NOTE — Progress Notes (Unsigned)
Cardiology Office Note   Date:  08/16/2022   ID:  Scott Doyle, DOB May 30, 1961, MRN [id number]  PCP:  Cletis Athens, MD  Cardiologist:   Kathlyn Sacramento, MD   Chief Complaint  Patient presents with   Follow-up    6 month f/u, no new cardiac concerns       History of Present Illness: Scott Doyle is a 61 y.o. male who presents for  followup visit regarding coronary artery disease. He is status post CABG and PCI of diagonal. He has other medical problems that include hypertension, hyperlipidemia and type 2 diabetes. Most recent cardiac catheterization in December 2015 showed patent LIMA to LAD and patent stents in the diagonal.  There was mild proximal RCA stenosis and overall no evidence of obstructive coronary artery disease. Most recent [facility] in April 2022 showed no evidence of ischemia with normal ejection fraction. The patient was hospitalized in April with left-sided weakness and numbness and was diagnosed with thalamic infarct.  Clopidogrel was added by neurology.  Echocardiogram showed normal LV systolic function with no significant valvular abnormalities.  The patient has been doing well from a cardiac standpoint with no chest pain, shortness of breath or palpitations.    Past Medical History:  Diagnosis Date   Anxiety    CHF (congestive heart failure) ([place])    Coronary artery disease    a. s/p prior CABG x 1 (LIMA->LAD) and PCI to the D2; b. 01/2012 PCI: patent prox Diag stent w/ sev distal dzs->DES x 1; c. 08/2014 Cath: LM nl, LAD 40m D2 patent stents w/ 388mLCX nl, RCA 40p, LIMA->LAD nl, EF 60%.   [place] (cere[place] vascular accident) ([facility]   Diabetes mellitus    Hyperlipidemia    Hypertension    [facility]    Past Surgical History:  Procedure Laterality Date   CARDIAC CATHETERIZATION     CARDIAC CATHETERIZATION  08/2014   Patent LIMA to LAD and patent diagonal stents.   COLONOSCOPY WITH PROPOFOL N/A 06/06/2022   Procedure:  COLONOSCOPY WITH PROPOFOL;  Surgeon: AnJonathon BellowsMD;  [facility];  Service: Gastroenterology;  Laterality: N/A;   CORONARY ARTERY BYPASS GRAFT  2001   [facility]     Current Outpatient Medications  Medication Sig Dispense Refill   ACCU-CHEK AVIVA PLUS test strip USE ONE STRIP DAILY AS DIRECTED 100 strip 2   aspirin 81 MG tablet Take 81 mg by mouth daily.     atenolol (TENORMIN) 25 MG tablet Take 1 tablet (25 mg total) by mouth daily. 90 tablet 3   atorvastatin (LIPITOR) 40 MG tablet Take 1 tablet (40 mg total) by mouth daily. 90 tablet 3   clopidogrel (PLAVIX) 75 MG tablet Take 1 tablet (75 mg total) by mouth daily. 90 tablet 3   empagliflozin (JARDIANCE) 10 MG TABS tablet Take 1 tablet (10 mg total) by mouth daily before breakfast. 90 tablet 1   ezetimibe (ZETIA) 10 MG tablet Take 1 tablet (10 mg total) by mouth daily. 90 tablet 3   glipiZIDE (GLUCOTROL XL) 2.5 MG 24 hr tablet TAKE 1 TABLET BY MOUTH EVERY DAY 90 tablet 3   Insulin Pen Needle (PEN NEEDLES) 33G X 4 MM MISC Use to administer insulin daily 30 each 6   isosorbide mononitrate (IMDUR) 120 MG 24 hr tablet TAKE 1 TABLET BY MOUTH EVERY DAY 90 tablet 3   LANTUS SOLOSTAR 100 UNIT/ML Solostar Pen Inject 50 Units into the skin daily as needed (blood  glucose >150). 15 mL 6   metFORMIN (GLUCOPHAGE) 1000 MG tablet TAKE 1 TABLET BY MOUTH TWICE A DAY 180 tablet 1   nitroGLYCERIN (NITROSTAT) 0.4 MG SL tablet Place 1 tablet (0.4 mg total) under the tongue every 5 (five) minutes as needed. Maximum of 3 doses. 25 tablet 3   quinapril (ACCUPRIL) 40 MG tablet Take 1 tablet (40 mg total) by mouth daily. 90 tablet 5   torsemide (DEMADEX) 10 MG tablet TAKE 1 TABLET BY MOUTH EVERY DAY 90 tablet 3   omeprazole (PRILOSEC) 20 MG capsule Take 1 capsule (20 mg total) by mouth daily. (Patient not taking: Reported on 08/16/2022) 90 capsule 3   No current facility-administered medications for this visit.    Allergies:   Ampicillin and Penicillins     Social History:  The patient  reports that he quit smoking about 22 years ago. His smoking use included cigarettes. He has never used smokeless tobacco. He reports current alcohol use. He reports that he does not use drugs.   Family History:  The patient's family history includes Cancer in an other family member; Diabetes in an other family member; Heart disease in his mother; Hypertension in his mother.    ROS:  Please see the history of present illness.   Otherwise, review of systems are positive for none.   All other systems are reviewed and negative.    PHYSICAL EXAM: VS:  BP (!) 160/68 (BP Location: Left Arm, Patient Position: Sitting, Cuff Size: Normal)   Pulse (!) 56   Ht '5\' 7"'$  (1.702 m)   Wt 167 lb 3.2 oz (75.8 kg)   SpO2 98%   BMI 26.19 kg/m  , BMI Body mass index is 26.19 kg/m. GEN: Well nourished, well developed, in no acute distress  HEENT: normal  Neck: no JVD, carotid bruits, or masses Cardiac: RRR; no rubs, or gallops,no edema . There is 2/ 6 systolic ejection murmur in the aortic area Respiratory:  clear to auscultation bilaterally, normal work of breathing GI: soft, nontender, nondistended, + BS MS: no deformity or atrophy  Skin: warm and dry, no rash Neuro:  Strength and sensation are intact Psych: euthymic mood, full affect   EKG:  EKG is ordered today. The ekg ordered today demonstrates sinus bradycardia with no significant ST or T wave changes.  Recent Labs: 12/25/2021: ALT 27; B Natriuretic Peptide 76.2; BUN 15; Creatinine, Ser 0.88; Hemoglobin 16.2; Platelets 183; Potassium 4.6; Sodium 138    Lipid Panel    Component Value Date/Time   CHOL 124 12/26/2021 0538   CHOL 95 11/25/2011 1201   TRIG 197 (H) 12/26/2021 0538   TRIG 76 11/25/2011 1201   HDL 26 (L) 12/26/2021 0538   HDL 29 (L) 11/25/2011 1201   CHOLHDL 4.8 12/26/2021 0538   VLDL 39 12/26/2021 0538   VLDL 15 11/25/2011 1201   LDLCALC 59 12/26/2021 0538   LDLCALC 51 11/25/2011 1201    LDLDIRECT 60.1 02/15/2021 1717      Wt Readings from Last 3 Encounters:  08/16/22 167 lb 3.2 oz (75.8 kg)  08/12/22 165 lb (74.8 kg)  07/05/22 165 lb (74.8 kg)       ASSESSMENT AND PLAN:  1.  Coronary artery disease involving native coronary arteries with other forms of angina: The patient has prolonged history of atypical angina.  He seems to be stable on beta-blocker and long-acting nitroglycerin.  Most recent [place] in 2022 showed no evidence of ischemia.    2.  Chronic  diastolic heart failure: He appears to be euvolemic on small dose torsemide 10 mg daily.  He is also on Jardiance 10 mg daily.    3.  Essential hypertension: Blood pressures well controlled on current medications.  4. Hyperlipidemia: I reviewed most recent lipid profile done in April which showed an LDL of 59 which is at target.  Continue treatment with atorvastatin and ezetimibe.  5. Type 2 diabetes: Still not optimally controlled.   I discussed healthy diet with him.  He tends to do a lot of cooking and likes making desserts.  5.  Mild bilateral carotid disease: Most recent carotid Doppler showed mild nonobstructive disease less than 40%.  No need to repeat unless there is a clinical indication.   Disposition:   FU with me in 6 months.  Signed,  Kathlyn Sacramento, MD  08/16/2022 4:24 PM    [place]

## 2022-08-16 NOTE — Patient Instructions (Signed)
Medication Instructions:  STOP the Atenolol  START Carvedilol 6.25 mg twice daily  *If you need a refill on your cardiac medications before your next appointment, please call your pharmacy*   Lab Work: None ordered If you have labs (blood work) drawn today and your tests are completely normal, you will receive your results only by: [facility] (if you have MyChart) OR A paper copy in the mail If you have any lab test that is abnormal or we need to change your treatment, we will call you to review the results.   Testing/Procedures: None ordered   Follow-Up: At [facility], you and your health needs are our priority.  As part of our continuing mission to provide you with exceptional heart care, we have created designated Provider Care Teams.  These Care Teams include your primary Cardiologist (physician) and Advanced Practice Providers (APPs -  Physician Assistants and Nurse Practitioners) who all work together to provide you with the care you need, when you need it.  We recommend signing up for the patient portal called "MyChart".  Sign up information is provided on this After Visit Summary.  MyChart is used to connect with patients for Virtual Visits (Telemedicine).  Patients are able to view lab/test results, encounter notes, upcoming appointments, etc.  Non-urgent messages can be sent to your provider as well.   To learn more about what you can do with MyChart, go to [email or web address].    Your next appointment:   6 month(s)  The format for your next appointment:   In Person  Provider:   You may see Kathlyn Sacramento, MD or one of the following Advanced Practice Providers on your designated Care Team:   Murray Hodgkins, NP Christell Faith, PA-C Cadence Kathlen Mody, PA-C Gerrie Nordmann, NP     Important Information About Sugar

## 2022-09-12 ENCOUNTER — Telehealth: Payer: Self-pay

## 2022-09-12 NOTE — Telephone Encounter (Signed)
Patient is having sinus drainage, asking for a ZPACK to be called in to [id number] [place] [place]

## 2022-09-12 NOTE — Telephone Encounter (Signed)
Spoke with patient regarding his symptoms.  Offered an appointment but patient declined.  Requested he take a covid test and call back with results.

## 2022-09-13 MED ORDER — AZITHROMYCIN 250 MG PO TABS: ORAL_TABLET | ORAL | 0 refills | Status: AC

## 2022-09-13 NOTE — Telephone Encounter (Signed)
Patient called and covid test was negative. Would still like a ZPACK to be called in.

## 2022-09-13 NOTE — Addendum Note (Signed)
Addended by: Amado Coe on: 09/13/2022 01:57 PM   Modules accepted: Orders

## 2022-09-13 NOTE — Telephone Encounter (Signed)
Zpak sent per Dr Lavera Guise.  Left message informing patient.

## 2022-09-17 IMAGING — MR MR HEAD W/O CM
12 series · 48 of 48 positions shown · non-contrast
Comparison: Head CT from earlier today.

CLINICAL DATA: Neuro deficit with acute stroke suspected

EXAM:
MRI HEAD WITHOUT CONTRAST
MRA HEAD WITHOUT CONTRAST
TECHNIQUE: Multiplanar, multi-echo pulse sequences of the brain and surrounding
structures were acquired without intravenous contrast. Angiographic
images of the Circle of Willis were acquired using MRA technique
without intravenous contrast.

[Series 5: ax dwi_tracew · axial · 3.0mm · 0.65mm/px · z∈[-88,+65]mm · 2 of 48 slices shown]
[im 1/48]
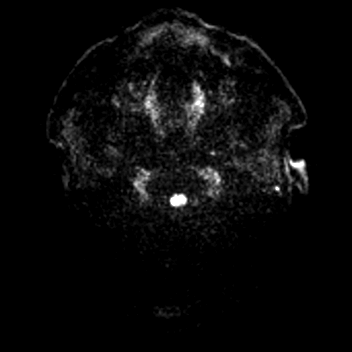
[im 48/48]
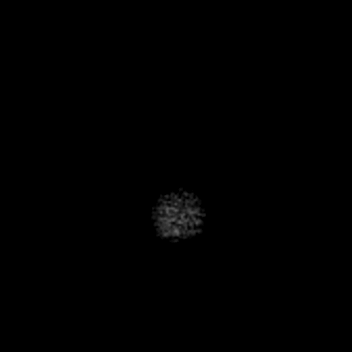

[Series 6: ax dwi_adc · axial · 3.0mm · 0.65mm/px · z∈[-88,+65]mm · 3 of 48 slices shown]
[im 1/48]
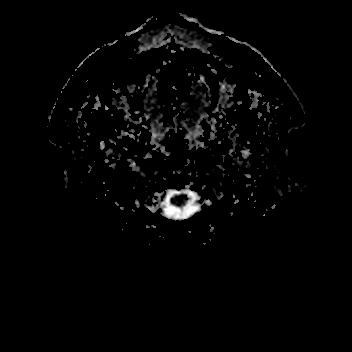
[im 24/48]
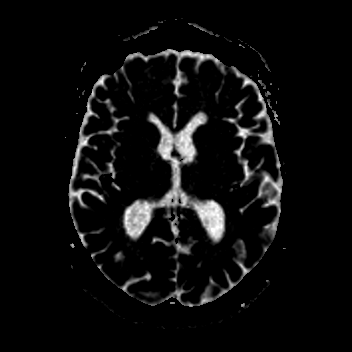
[im 48/48]
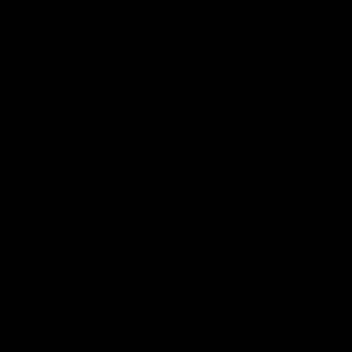

[Series 7: cor dwi_tracew · coronal · 5.0mm · 0.68mm/px · 5 of 80 slices shown]
[im 1/80]
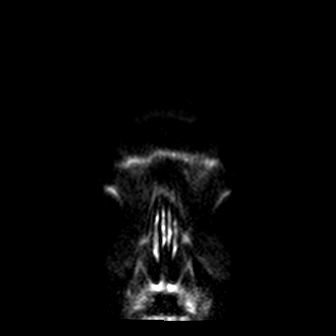
[im 20/80]
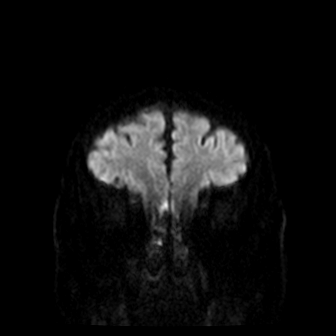
[im 40/80]
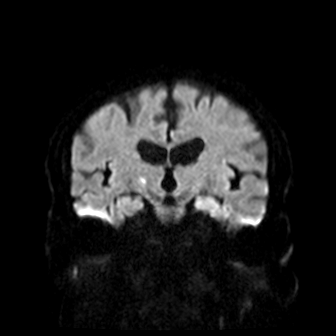
[im 60/80]
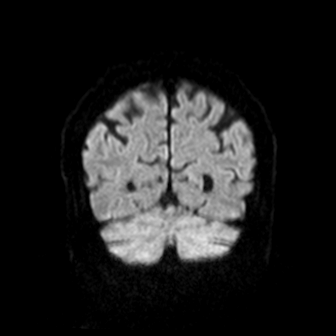
[im 80/80]
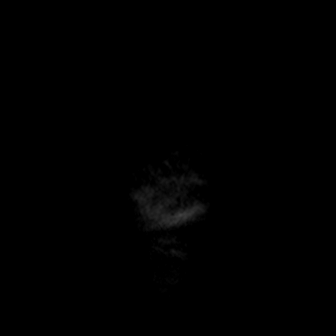

[Series 9: T1 · sagittal · 5.0mm · 0.62mm/px · 2 of 25 slices shown (1 of 2)]
[im 1/25]
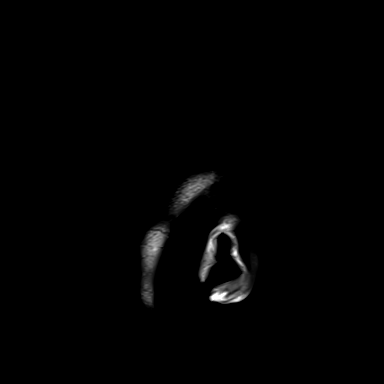
[im 25/25]
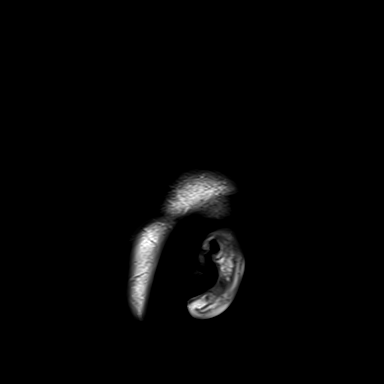

[Series 10: T2 · axial · 5.0mm · 0.53mm/px · z∈[-82,+60]mm · 2 of 25 slices shown (1 of 2)]
[im 1/25]
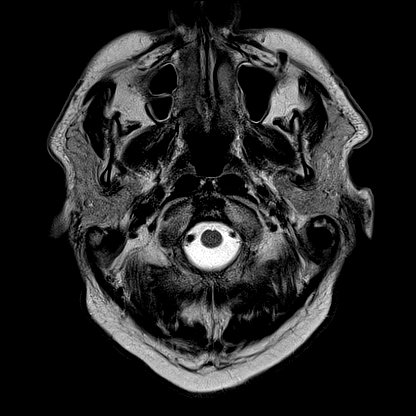
[im 25/25]
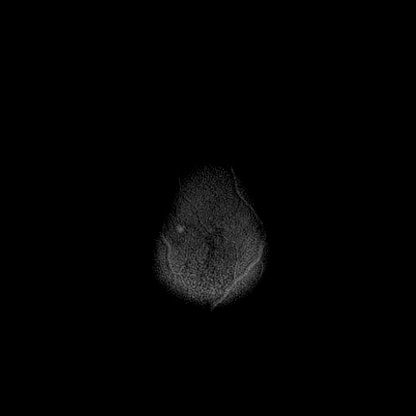

[Series 16: ax swi_mag · axial · 2.0mm · 0.90mm/px · z∈[-90,+67]mm · 5 of 80 slices shown]
[im 1/80]
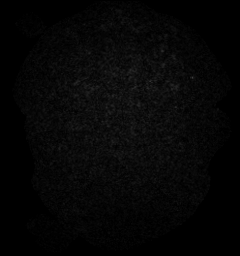
[im 20/80]
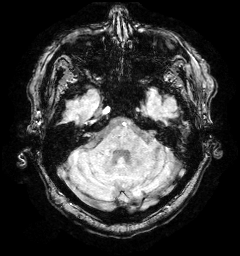
[im 40/80]
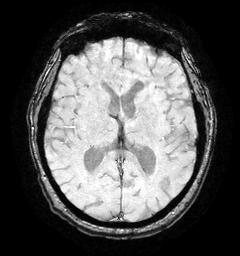
[im 60/80]
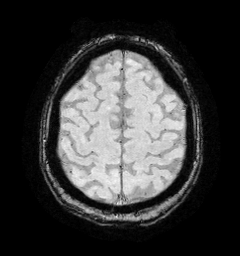
[im 80/80]
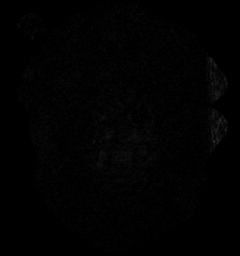

[Series 17: ax swi_pha · axial · 2.0mm · 0.90mm/px · z∈[-90,+67]mm · 5 of 80 slices shown]
[im 1/80]
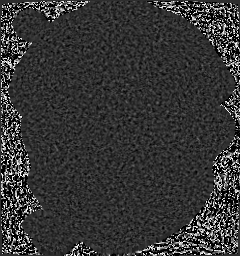
[im 20/80]
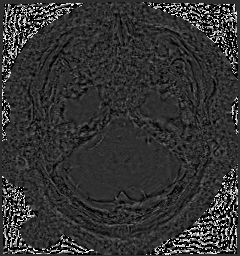
[im 40/80]
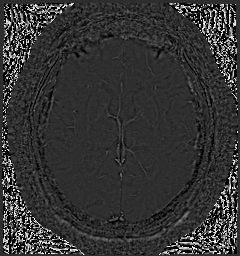
[im 60/80]
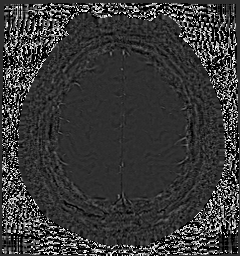
[im 80/80]
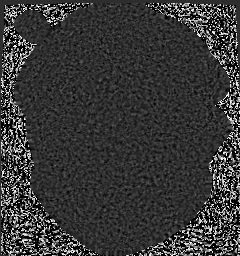

[Series 18: ax swi_swi · axial · 2.0mm · 0.90mm/px · z∈[-90,+67]mm · 5 of 80 slices shown]
[im 1/80]
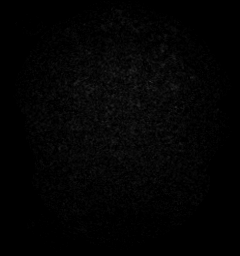
[im 20/80]
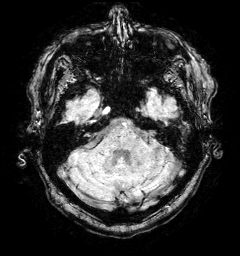
[im 40/80]
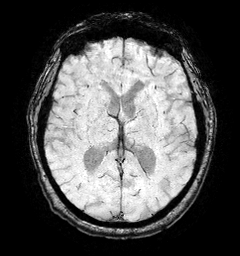
[im 60/80]
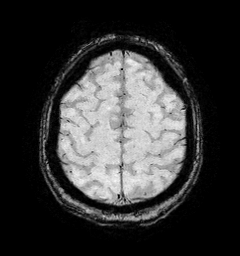
[im 80/80]
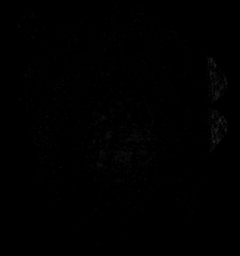

[Series 19: ax swi_swi_mip · axial · 16.0mm · 0.90mm/px · z∈[-83,+60]mm · 4 of 73 slices shown]
[im 1/73]
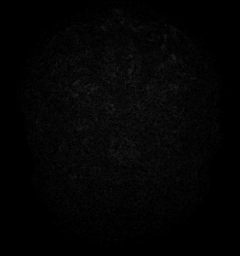
[im 25/73]
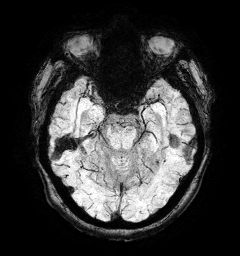
[im 49/73]
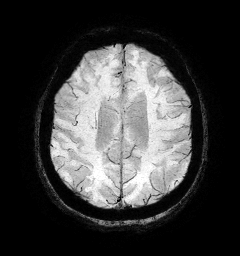
[im 73/73]
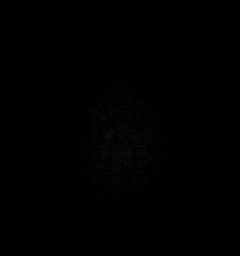

[Series 20: FLAIR · axial · 3.0mm · 0.53mm/px · z∈[-91,+69]mm · 3 of 55 slices shown]
[im 1/55]
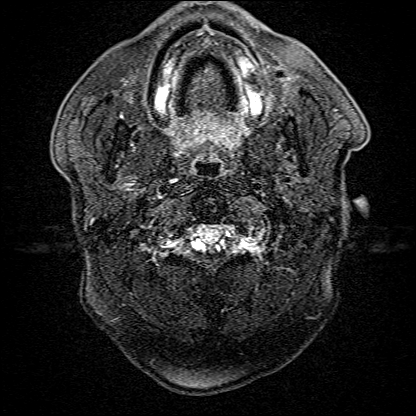
[im 28/55]
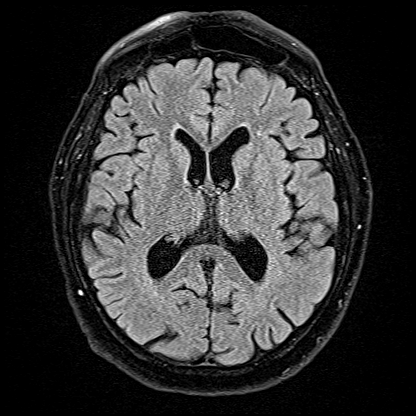
[im 55/55]
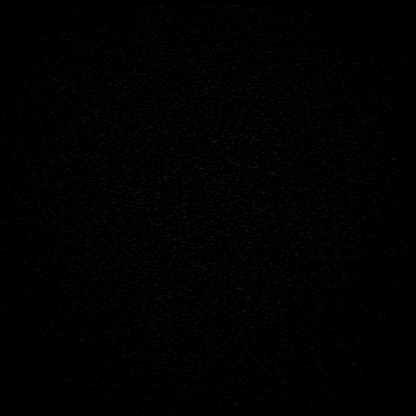

[Series 21: T1 · axial · 1.0mm · 0.98mm/px · z∈[-100,+71]mm · 10 of 172 slices shown (2 of 2)]
[im 1/172]
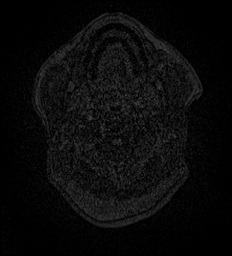
[im 20/172]
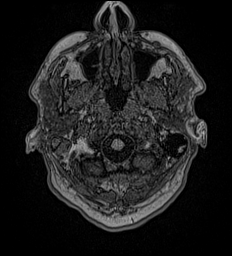
[im 39/172]
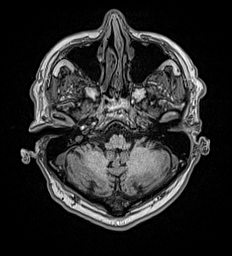
[im 58/172]
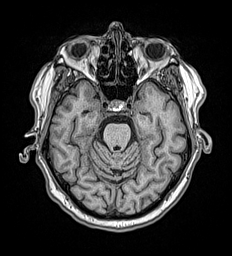
[im 77/172]
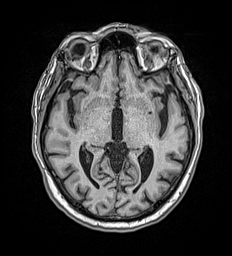
[im 96/172]
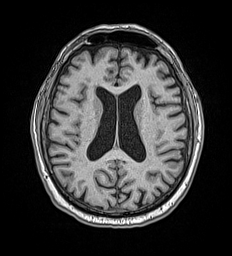
[im 115/172]
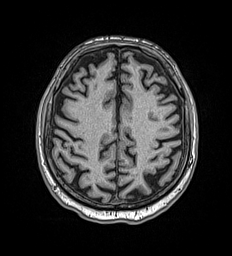
[im 134/172]
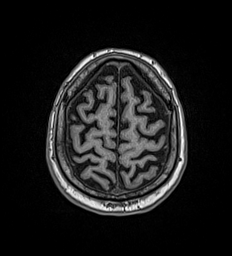
[im 153/172]
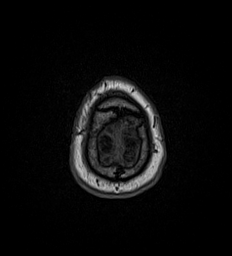
[im 172/172]
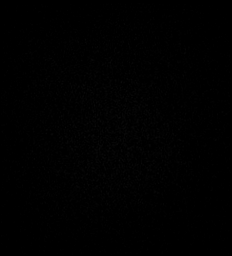

[Series 22: T2 · coronal · 5.0mm · 0.86mm/px · 2 of 30 slices shown (2 of 2)]
[im 1/30]
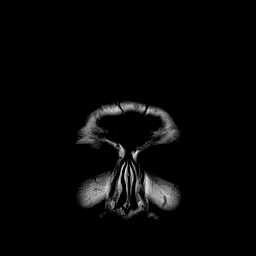
[im 30/30]
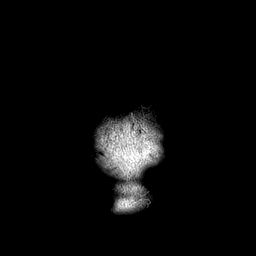

[48 of 48 positions shown; findings below may reference images not displayed]

FINDINGS: MRI HEAD FINDINGS

Brain: 7 mm area of restricted diffusion in the lateral right
thalamus. No hemorrhage, hydrocephalus, or mass. Cerebral volume
loss which is generalized. No chronic hemorrhagic injury. Mild
chronic small vessel ischemia in the hemispheric white matter.

Vascular: Normal flow voids.

Skull and upper cervical spine: Normal marrow signal.

Sinuses/Orbits: No acute or significant finding.

MRA HEAD FINDINGS

Carotid, vertebral, and basilar arteries are widely patent. Fetal
type right PCA. High-grade left PCA bifurcation and right P2 segment
stenoses with flow gap. No anterior circulation branch occlusion or
flow limiting stenosis. No evidence of aneurysm.
IMPRESSION: 1. Acute lacunar infarct in the right thalamus.
2. Bilateral high-grade PCA stenosis. The right PCA is fetal type
with narrowing at the P2 segment.

## 2022-10-03 ENCOUNTER — Other Ambulatory Visit: Payer: Self-pay | Admitting: Cardiovascular Disease

## 2022-10-29 ENCOUNTER — Other Ambulatory Visit: Payer: Self-pay | Admitting: Physician Assistant

## 2022-10-29 DIAGNOSIS — I10 Essential (primary) hypertension: Secondary | ICD-10-CM

## 2022-11-01 ENCOUNTER — Other Ambulatory Visit: Payer: Self-pay | Admitting: Physician Assistant

## 2022-11-07 DIAGNOSIS — E119 Type 2 diabetes mellitus without complications: Secondary | ICD-10-CM | POA: Diagnosis not present

## 2022-11-07 DIAGNOSIS — Z008 Encounter for other general examination: Secondary | ICD-10-CM | POA: Diagnosis not present

## 2022-11-10 ENCOUNTER — Telehealth: Payer: Self-pay | Admitting: Cardiovascular Disease

## 2022-11-10 NOTE — Telephone Encounter (Signed)
Patient called stating he is having a tooth extraction done.  He wants to know if he needs to stop his Plavix prior to having his tooth extracted.

## 2022-11-11 NOTE — Telephone Encounter (Signed)
Patient has been made aware.

## 2022-11-11 NOTE — Telephone Encounter (Signed)
No, patient should not stop plavix.

## 2022-12-27 ENCOUNTER — Other Ambulatory Visit: Payer: Self-pay | Admitting: Cardiovascular Disease

## 2023-01-29 ENCOUNTER — Other Ambulatory Visit: Payer: Self-pay | Admitting: Physician Assistant

## 2023-02-28 ENCOUNTER — Ambulatory Visit
Payer: No Typology Code available for payment source | Attending: Cardiovascular Disease | Admitting: Cardiovascular Disease

## 2023-02-28 ENCOUNTER — Other Ambulatory Visit: Payer: Self-pay

## 2023-02-28 ENCOUNTER — Encounter: Payer: Self-pay | Admitting: Cardiovascular Disease

## 2023-02-28 VITALS — BP 160/86 | HR 63 | Ht 66.0 in | Wt 164.0 lb

## 2023-02-28 DIAGNOSIS — I6523 Occlusion and stenosis of bilateral carotid arteries: Secondary | ICD-10-CM | POA: Diagnosis not present

## 2023-02-28 DIAGNOSIS — I10 Essential (primary) hypertension: Secondary | ICD-10-CM | POA: Diagnosis not present

## 2023-02-28 DIAGNOSIS — I5032 Chronic diastolic (congestive) heart failure: Secondary | ICD-10-CM

## 2023-02-28 DIAGNOSIS — E785 Hyperlipidemia, unspecified: Secondary | ICD-10-CM

## 2023-02-28 DIAGNOSIS — I25118 Atherosclerotic heart disease of native coronary artery with other forms of angina pectoris: Secondary | ICD-10-CM | POA: Diagnosis not present

## 2023-02-28 MED ORDER — LOSARTAN POTASSIUM-HCTZ 100-25 MG PO TABS: 1.0000 | ORAL_TABLET | Freq: Every day | ORAL | 1 refills | Status: DC

## 2023-02-28 NOTE — Patient Instructions (Addendum)
Medication Instructions:  STOP the Quinapril STOP the Torsemide  START Losartan-Hydrochlorothiazide 100-25 mg once daily  *If you need a refill on your cardiac medications before your next appointment, please call your pharmacy*   Lab Work: Your provider would like for you to return in one week to have the following labs drawn: CMET, CBC, Lipid, A1C.   Please go to the [facility] entrance and check in at the front desk.  You do not need an appointment.  They are open from 7am-6 pm.  You will need to be fasting.  If you have labs (blood work) drawn today and your tests are completely normal, you will receive your results only by: MyChart Message (if you have MyChart) OR A paper copy in the mail If you have any lab test that is abnormal or we need to change your treatment, we will call you to review the results.   Testing/Procedures: None ordered   Follow-Up: At [facility], you and your health needs are our priority.  As part of our continuing mission to provide you with exceptional heart care, we have created designated Provider Care Teams.  These Care Teams include your primary Cardiologist (physician) and Advanced Practice Providers (APPs -  Physician Assistants and Nurse Practitioners) who all work together to provide you with the care you need, when you need it.  We recommend signing up for the patient portal called "MyChart".  Sign up information is provided on this After Visit Summary.  MyChart is used to connect with patients for Virtual Visits (Telemedicine).  Patients are able to view lab/test results, encounter notes, upcoming appointments, etc.  Non-urgent messages can be sent to your provider as well.   To learn more about what you can do with MyChart, go to [email or web address].    Your next appointment:   6 month(s)  Provider:   You may see Lorine Bears, MD or one of the following Advanced Practice Providers on your designated Care Team:    Nicolasa Ducking, NP Eula Listen, PA-C Cadence Fransico Michael, PA-C Charlsie Quest, NP

## 2023-02-28 NOTE — Progress Notes (Signed)
Cardiology Office Note   Date:  02/28/2023   ID:  Scott Doyle, DOB Oct 27, 1960, MRN [id number]  PCP:  Corky Downs, MD  Cardiologist:   Lorine Bears, MD   Chief Complaint  Patient presents with   Follow-up    6 month follow up - patient has no complaints at this time. Meds reviewed verbally with patient.      History of Present Illness: [name] is a 62 y.o. male who presents for a follow up visit regarding coronary artery disease. He is status post CABG and PCI of diagonal. He has other medical problems that include hypertension, hyperlipidemia and type 2 diabetes. Most recent cardiac catheterization in December 2015 showed patent LIMA to LAD and patent stents in the diagonal.  There was mild proximal RCA stenosis and overall no evidence of obstructive coronary artery disease. Most recent Lexiscan Myoview in April 2022 showed no evidence of ischemia with normal ejection fraction. The patient was hospitalized in April with left-sided weakness and numbness and was diagnosed with thalamic infarct.  Clopidogrel was added by neurology.  Echocardiogram showed normal LV systolic function with no significant valvular abnormalities.  During last visit, I switched him from atenolol to carvedilol due to uncontrolled hypertension.  His blood pressure remains elevated.  He denies chest pain or shortness of breath.     Past Medical History:  Diagnosis Date   Anxiety    CHF (congestive heart failure) (HCC)    Coronary artery disease    a. s/p prior CABG x 1 (LIMA->LAD) and PCI to the D2; b. 01/2012 PCI: patent prox Diag stent w/ sev distal dzs->DES x 1; c. 08/2014 Cath: LM nl, LAD 25m, D2 patent stents w/ 17m, LCX nl, RCA 40p, LIMA->LAD nl, EF 60%.   CVA (cerebral vascular accident) (HCC)    Diabetes mellitus    Hyperlipidemia    Hypertension    Thrombocytopathia [facility])     Past Surgical History:  Procedure Laterality Date   CARDIAC CATHETERIZATION     CARDIAC  CATHETERIZATION  08/2014   Patent LIMA to LAD and patent diagonal stents.   COLONOSCOPY WITH PROPOFOL N/A 06/06/2022   Procedure: COLONOSCOPY WITH PROPOFOL;  Surgeon: Wyline Mood, MD;  Location[facility] ENDOSCOPY;  Service: Gastroenterology;  Laterality: N/A;   CORONARY ARTERY BYPASS GRAFT  2001   [facility]     Current Outpatient Medications  Medication Sig Dispense Refill   ACCU-CHEK AVIVA PLUS test strip USE ONE STRIP DAILY AS DIRECTED 100 strip 2   aspirin 81 MG tablet Take 81 mg by mouth daily.     atorvastatin (LIPITOR) 40 MG tablet TAKE 1 TABLET BY MOUTH EVERY DAY 90 tablet 3   carvedilol (COREG) 6.25 MG tablet Take 1 tablet (6.25 mg total) by mouth 2 (two) times daily. 180 tablet 3   clopidogrel (PLAVIX) 75 MG tablet Take 1 tablet (75 mg total) by mouth daily. 90 tablet 3   empagliflozin (JARDIANCE) 10 MG TABS tablet TAKE 1 TABLET BY MOUTH DAILY BEFORE BREAKFAST. 90 tablet 0   ezetimibe (ZETIA) 10 MG tablet Take 1 tablet (10 mg total) by mouth daily. 90 tablet 3   glipiZIDE (GLUCOTROL XL) 2.5 MG 24 hr tablet TAKE 1 TABLET BY MOUTH EVERY DAY 90 tablet 3   Insulin Pen Needle (PEN NEEDLES) 33G X 4 MM MISC Use to administer insulin daily 30 each 6   isosorbide mononitrate (IMDUR) 120 MG 24 hr tablet TAKE 1 TABLET BY MOUTH EVERY DAY 90 tablet  3   LANTUS SOLOSTAR 100 UNIT/ML Solostar Pen Inject 50 Units into the skin daily as needed (blood glucose >150). 15 mL 6   metFORMIN (GLUCOPHAGE) 1000 MG tablet TAKE 1 TABLET BY MOUTH TWICE A DAY 180 tablet 1   nitroGLYCERIN (NITROSTAT) 0.4 MG SL tablet Place 1 tablet (0.4 mg total) under the tongue every 5 (five) minutes as needed. Maximum of 3 doses. 25 tablet 3   omeprazole (PRILOSEC) 20 MG capsule Take 1 capsule (20 mg total) by mouth daily. 90 capsule 3   quinapril (ACCUPRIL) 40 MG tablet Take 1 tablet (40 mg total) by mouth daily. 90 tablet 5   torsemide (DEMADEX) 10 MG tablet TAKE 1 TABLET BY MOUTH EVERY DAY 90 tablet 3   No current  facility-administered medications for this visit.    Allergies:   Ampicillin and Penicillins    Social History:  The patient  reports that he quit smoking about 23 years ago. His smoking use included cigarettes. He has never used smokeless tobacco. He reports current alcohol use. He reports that he does not use drugs.   Family History:  The patient's family history includes Cancer in an other family member; Diabetes in an other family member; Heart disease in his mother; Hypertension in his mother.    ROS:  Please see the history of present illness.   Otherwise, review of systems are positive for none.   All other systems are reviewed and negative.    PHYSICAL EXAM: VS:  BP (!) 160/86 (BP Location: Left Arm, Patient Position: Sitting, Cuff Size: Normal)   Pulse 63   Ht 5\' 6"  (1.676 m)   Wt 164 lb (74.4 kg)   BMI 26.47 kg/m  , BMI Body mass index is 26.47 kg/m. GEN: Well nourished, well developed, in no acute distress  HEENT: normal  Neck: no JVD, carotid bruits, or masses Cardiac: RRR; no rubs, or gallops,no edema . There is 2/ 6 systolic ejection murmur in the aortic area Respiratory:  clear to auscultation bilaterally, normal work of breathing GI: soft, nontender, nondistended, + BS MS: no deformity or atrophy  Skin: warm and dry, no rash Neuro:  Strength and sensation are intact Psych: euthymic mood, full affect   EKG:  EKG is ordered today. The ekg ordered today demonstrates normal sinus rhythm with no significant.  Recent Labs: No results found for requested labs within last 365 days.    Lipid Panel    Component Value Date/Time   CHOL 124 12/26/2021 0538   CHOL 95 11/25/2011 1201   TRIG 197 (H) 12/26/2021 0538   TRIG 76 11/25/2011 1201   HDL 26 (L) 12/26/2021 0538   HDL 29 (L) 11/25/2011 1201   CHOLHDL 4.8 12/26/2021 0538   VLDL 39 12/26/2021 0538   VLDL 15 11/25/2011 1201   LDLCALC 59 12/26/2021 0538   LDLCALC 51 11/25/2011 1201   LDLDIRECT 60.1 02/15/2021  1717      Wt Readings from Last 3 Encounters:  02/28/23 164 lb (74.4 kg)  08/16/22 167 lb 3.2 oz (75.8 kg)  08/12/22 165 lb (74.8 kg)       ASSESSMENT AND PLAN:  1.  Coronary artery disease involving native coronary arteries with other forms of angina: The patient has prolonged history of atypical angina.  He seems to be stable on beta-blocker and long-acting nitroglycerin.  Most recent Lexiscan Myoview in 2022 showed no evidence of ischemia.  Continue same medications.  2.  Chronic diastolic heart failure: He appears to  be euvolemic.  I elected to discontinue torsemide due to initiation of hydrochlorothiazide.  3.  Essential hypertension: Blood pressure remains elevated in spite of switching atenolol to carvedilol.  I elected to stop quinapril and start him on losartan-hydrochlorothiazide 100-25 mg once daily and discontinue torsemide.  Check labs in 1 week.  4. Hyperlipidemia: Continue atorvastatin and ezetimibe.  I requested follow-up lipid and liver profile.  5. Type 2 diabetes: Still not optimally controlled.   Check hemoglobin A1c with next labs.  5.  Mild bilateral carotid disease: Most recent carotid Doppler showed mild nonobstructive disease less than 40%.  No need to repeat unless there is a clinical indication.   Disposition:   FU with me in 6 months.  Signed,  Lorine Bears, MD  02/28/2023 4:36 PM    [facility]

## 2023-03-02 NOTE — Addendum Note (Signed)
Addended by: Sherri Rad C on: 03/02/2023 10:29 AM   Modules accepted: Orders

## 2023-03-07 ENCOUNTER — Other Ambulatory Visit
Admission: RE | Admit: 2023-03-07 | Discharge: 2023-03-07 | Disposition: A | Discharge status: Home / Self Care | Payer: No Typology Code available for payment source | Attending: Cardiovascular Disease | Admitting: Cardiovascular Disease

## 2023-03-07 DIAGNOSIS — E785 Hyperlipidemia, unspecified: Secondary | ICD-10-CM | POA: Diagnosis not present

## 2023-03-07 DIAGNOSIS — I25118 Atherosclerotic heart disease of native coronary artery with other forms of angina pectoris: Secondary | ICD-10-CM | POA: Insufficient documentation

## 2023-03-07 DIAGNOSIS — E119 Type 2 diabetes mellitus without complications: Secondary | ICD-10-CM | POA: Insufficient documentation

## 2023-03-07 DIAGNOSIS — I10 Essential (primary) hypertension: Secondary | ICD-10-CM | POA: Diagnosis not present

## 2023-03-07 LAB — CBC
HCT: 42.5 % (ref 39.0–52.0)
Hemoglobin: 14.7 g/dL (ref 13.0–17.0)
MCH: 30.2 pg (ref 26.0–34.0)
MCHC: 34.6 g/dL (ref 30.0–36.0)
MCV: 87.4 fL (ref 80.0–100.0)
Platelets: 161 10*3/uL (ref 150–400)
RBC: 4.86 MIL/uL (ref 4.22–5.81)
RDW: 12.3 % (ref 11.5–15.5)
WBC: 6.7 10*3/uL (ref 4.0–10.5)
nRBC: 0 % (ref 0.0–0.2)

## 2023-03-07 LAB — COMPREHENSIVE METABOLIC PANEL
ALT: 23 U/L (ref 0–44)
AST: 23 U/L (ref 15–41)
Albumin: 4 g/dL (ref 3.5–5.0)
Alkaline Phosphatase: 65 U/L (ref 38–126)
Anion gap: 9 (ref 5–15)
BUN: 13 mg/dL (ref 8–23)
CO2: 24 mmol/L (ref 22–32)
Calcium: 8.7 mg/dL — ABNORMAL LOW (ref 8.9–10.3)
Chloride: 102 mmol/L (ref 98–111)
Creatinine, Ser: 0.68 mg/dL (ref 0.61–1.24)
GFR, Estimated: >60 mL/min (ref >60)
Glucose, Bld: 195 mg/dL — ABNORMAL HIGH (ref 70–99)
Potassium: 4 mmol/L (ref 3.5–5.1)
Sodium: 135 mmol/L (ref 135–145)
Total Bilirubin: 0.9 mg/dL (ref 0.3–1.2)
Total Protein: 7 g/dL (ref 6.5–8.1)

## 2023-03-07 LAB — LIPID PANEL
Cholesterol: 106 mg/dL (ref 0–200)
HDL: 23 mg/dL — ABNORMAL LOW (ref >40)
LDL Cholesterol: 50 mg/dL (ref 0–99)
Total CHOL/HDL Ratio: 4.6 RATIO
Triglycerides: 167 mg/dL — ABNORMAL HIGH (ref <150)
VLDL: 33 mg/dL (ref 0–40)

## 2023-03-08 LAB — HEMOGLOBIN A1C
Hgb A1c MFr Bld: 9.3 % — ABNORMAL HIGH (ref 4.8–5.6)
Mean Plasma Glucose: 220 mg/dL

## 2023-04-26 ENCOUNTER — Other Ambulatory Visit: Payer: Self-pay | Admitting: Physician Assistant

## 2023-05-17 DIAGNOSIS — E113293 Type 2 diabetes mellitus with mild nonproliferative diabetic retinopathy without macular edema, bilateral: Secondary | ICD-10-CM | POA: Diagnosis not present

## 2023-05-17 DIAGNOSIS — H52223 Regular astigmatism, bilateral: Secondary | ICD-10-CM | POA: Diagnosis not present

## 2023-05-17 DIAGNOSIS — H5213 Myopia, bilateral: Secondary | ICD-10-CM | POA: Diagnosis not present

## 2023-05-17 DIAGNOSIS — H2513 Age-related nuclear cataract, bilateral: Secondary | ICD-10-CM | POA: Diagnosis not present

## 2023-05-17 DIAGNOSIS — D3131 Benign neoplasm of right choroid: Secondary | ICD-10-CM | POA: Diagnosis not present

## 2023-05-17 LAB — OPHTHALMOLOGY REPORT-SCANNED

## 2023-05-28 ENCOUNTER — Other Ambulatory Visit: Payer: Self-pay | Admitting: Physician Assistant

## 2023-05-28 ENCOUNTER — Other Ambulatory Visit: Payer: Self-pay | Admitting: Cardiovascular Disease

## 2023-06-10 ENCOUNTER — Other Ambulatory Visit: Payer: Self-pay | Admitting: Physician Assistant

## 2023-06-13 ENCOUNTER — Other Ambulatory Visit: Payer: Self-pay | Admitting: Cardiovascular Disease

## 2023-06-13 ENCOUNTER — Other Ambulatory Visit: Payer: Self-pay | Admitting: Physician Assistant

## 2023-06-29 ENCOUNTER — Telehealth: Payer: Self-pay | Admitting: Cardiovascular Disease

## 2023-06-29 NOTE — Telephone Encounter (Signed)
Patient requesting a print out of his medication list. Medication list printed and placed in the front office for pick up.

## 2023-06-29 NOTE — Telephone Encounter (Signed)
Pt would like a list of his current medications. Please advise

## 2023-07-31 DIAGNOSIS — E119 Type 2 diabetes mellitus without complications: Secondary | ICD-10-CM | POA: Diagnosis not present

## 2023-07-31 DIAGNOSIS — M7500 Adhesive capsulitis of unspecified shoulder: Secondary | ICD-10-CM | POA: Diagnosis not present

## 2023-07-31 DIAGNOSIS — I10 Essential (primary) hypertension: Secondary | ICD-10-CM | POA: Diagnosis not present

## 2023-08-29 ENCOUNTER — Other Ambulatory Visit: Payer: Self-pay | Admitting: Cardiovascular Disease

## 2023-08-29 DIAGNOSIS — I10 Essential (primary) hypertension: Secondary | ICD-10-CM

## 2023-09-01 ENCOUNTER — Ambulatory Visit: Payer: No Typology Code available for payment source | Admitting: Cardiovascular Disease

## 2023-09-02 ENCOUNTER — Other Ambulatory Visit: Payer: Self-pay | Admitting: Cardiovascular Disease

## 2023-09-08 DIAGNOSIS — M25519 Pain in unspecified shoulder: Secondary | ICD-10-CM | POA: Diagnosis not present

## 2023-09-08 DIAGNOSIS — M7500 Adhesive capsulitis of unspecified shoulder: Secondary | ICD-10-CM | POA: Diagnosis not present

## 2023-09-08 DIAGNOSIS — I10 Essential (primary) hypertension: Secondary | ICD-10-CM | POA: Diagnosis not present

## 2023-09-08 DIAGNOSIS — E119 Type 2 diabetes mellitus without complications: Secondary | ICD-10-CM | POA: Diagnosis not present

## 2023-09-19 ENCOUNTER — Encounter: Payer: Self-pay | Admitting: Cardiovascular Disease

## 2023-09-19 ENCOUNTER — Ambulatory Visit
Payer: No Typology Code available for payment source | Attending: Cardiovascular Disease | Admitting: Cardiovascular Disease

## 2023-09-19 VITALS — BP 110/60 | HR 64 | Ht 66.0 in | Wt 166.0 lb

## 2023-09-19 DIAGNOSIS — I6523 Occlusion and stenosis of bilateral carotid arteries: Secondary | ICD-10-CM

## 2023-09-19 DIAGNOSIS — E785 Hyperlipidemia, unspecified: Secondary | ICD-10-CM

## 2023-09-19 DIAGNOSIS — I5032 Chronic diastolic (congestive) heart failure: Secondary | ICD-10-CM

## 2023-09-19 DIAGNOSIS — I10 Essential (primary) hypertension: Secondary | ICD-10-CM

## 2023-09-19 DIAGNOSIS — I25118 Atherosclerotic heart disease of native coronary artery with other forms of angina pectoris: Secondary | ICD-10-CM | POA: Diagnosis not present

## 2023-09-19 NOTE — Progress Notes (Signed)
 Cardiology Office Note   Date:  09/19/2023   ID:  Scott Doyle, DOB [birth year], MRN [id number]  PCP:  Scott King, MD  Cardiologist:   Scott Cage, MD   Chief Complaint  Patient presents with   Follow-up    6 month f/u c/o elevated BP in the am. Meds reviewed verbally with pt.      History of Present Illness: Scott Doyle is a 63 y.o. male who presents for a follow up visit regarding coronary artery disease. He is status post CABG and PCI of diagonal. He has other medical problems that include hypertension, hyperlipidemia and type 2 diabetes. Most recent cardiac catheterization in December 2015 showed patent LIMA to LAD and patent stents in the diagonal.  There was mild proximal RCA stenosis and overall no evidence of obstructive coronary artery disease. Most recent Lexiscan  Myoview  in April 2022 showed no evidence of ischemia with normal ejection fraction. The patient was hospitalized in April of 2023 with left-sided weakness and numbness and was diagnosed with thalamic infarct.  Clopidogrel  was added by neurology.  Echocardiogram showed normal LV systolic function with no significant valvular abnormalities.  He was switched from atenolol  to carvedilol  due to uncontrolled hypertension but blood pressure continued to be elevated.  During last visit, I added hydrochlorothiazide to losartan . For unclear reasons, he continues to take atenolol .  He has been doing well and denies chest pain, shortness of breath or palpitations.     Past Medical History:  Diagnosis Date   Anxiety    CHF (congestive heart failure) (HCC)    Coronary artery disease    a. s/p prior CABG x 1 (LIMA->LAD) and PCI to the D2; b. 01/2012 PCI: patent prox Diag stent w/ sev distal dzs->DES x 1; c. 08/2014 Cath: LM nl, LAD 3m, D2 patent stents w/ 40m, LCX nl, RCA 40p, LIMA->LAD nl, EF 60%.   CVA (cerebral vascular accident) (HCC)    Diabetes mellitus    Hyperlipidemia    Hypertension     Thrombocytopathia [facility])     Past Surgical History:  Procedure Laterality Date   CARDIAC CATHETERIZATION     CARDIAC CATHETERIZATION  08/2014   Patent LIMA to LAD and patent diagonal stents.   COLONOSCOPY WITH PROPOFOL  N/A 06/06/2022   Procedure: COLONOSCOPY WITH PROPOFOL ;  Surgeon: Scott Bi, MD;  Location: [facility] ENDOSCOPY;  Service: Gastroenterology;  Laterality: N/A;   CORONARY ARTERY BYPASS GRAFT  2001   [facility]     Current Outpatient Medications  Medication Sig Dispense Refill   ACCU-CHEK AVIVA PLUS test strip USE ONE STRIP DAILY AS DIRECTED 100 strip 2   aspirin  81 MG tablet Take 81 mg by mouth daily.     atenolol  (TENORMIN ) 25 MG tablet TAKE 1 TABLET (25 MG TOTAL) BY MOUTH DAILY. 90 tablet 3   atorvastatin  (LIPITOR) 40 MG tablet TAKE 1 TABLET BY MOUTH EVERY DAY 90 tablet 3   carvedilol  (COREG ) 6.25 MG tablet TAKE 1 TABLET BY MOUTH TWICE A DAY 180 tablet 3   clopidogrel  (PLAVIX ) 75 MG tablet Take 1 tablet (75 mg total) by mouth daily. 90 tablet 3   empagliflozin  (JARDIANCE ) 10 MG TABS tablet TAKE 1 TABLET BY MOUTH EVERY DAY BEFORE BREAKFAST 90 tablet 3   ezetimibe  (ZETIA ) 10 MG tablet TAKE 1 TABLET BY MOUTH EVERY DAY 90 tablet 3   glipiZIDE  (GLUCOTROL  XL) 2.5 MG 24 hr tablet TAKE 1 TABLET BY MOUTH EVERY DAY 90 tablet 3   Insulin  Pen Needle (  PEN NEEDLES) 33G X 4 MM MISC Use to administer insulin  daily 30 each 6   isosorbide  mononitrate (IMDUR ) 120 MG 24 hr tablet TAKE 1 TABLET BY MOUTH EVERY DAY 90 tablet 3   LANTUS  SOLOSTAR 100 UNIT/ML Solostar Pen Inject 50 Units into the skin daily as needed (blood glucose >150). 15 mL 6   losartan -hydrochlorothiazide (HYZAAR) 100-25 MG tablet TAKE 1 TABLET BY MOUTH EVERY DAY 90 tablet 1   metFORMIN  (GLUCOPHAGE ) 1000 MG tablet TAKE 1 TABLET BY MOUTH TWICE A DAY 180 tablet 1   nitroGLYCERIN  (NITROSTAT ) 0.4 MG SL tablet Place 1 tablet (0.4 mg total) under the tongue every 5 (five) minutes as needed. Maximum of 3 doses. 25 tablet 3   omeprazole   (PRILOSEC) 20 MG capsule Take 1 capsule (20 mg total) by mouth daily. (Patient not taking: Reported on 09/19/2023) 90 capsule 3   No current facility-administered medications for this visit.    Allergies:   Ampicillin and Penicillins    Social History:  The patient  reports that he quit smoking about 24 years ago. His smoking use included cigarettes. He has never used smokeless tobacco. He reports current alcohol use. He reports that he does not use drugs.   Family History:  The patient's family history includes Cancer in an other family member; Diabetes in an other family member; Heart disease in his mother; Hypertension in his mother.    ROS:  Please see the history of present illness.   Otherwise, review of systems are positive for none.   All other systems are reviewed and negative.    PHYSICAL EXAM: VS:  BP 110/60 (BP Location: Left Arm, Patient Position: Sitting, Cuff Size: Normal)   Pulse 64   Ht 5' 6 (1.676 m)   Wt 166 lb (75.3 kg)   SpO2 97%   BMI 26.79 kg/m  , BMI Body mass index is 26.79 kg/m. GEN: Well nourished, well developed, in no acute distress  HEENT: normal  Neck: no JVD, carotid bruits, or masses Cardiac: RRR; no rubs, or gallops,no edema . There is 2/ 6 systolic ejection murmur in the aortic area Respiratory:  clear to auscultation bilaterally, normal work of breathing GI: soft, nontender, nondistended, + BS MS: no deformity or atrophy  Skin: warm and dry, no rash Neuro:  Strength and sensation are intact Psych: euthymic mood, full affect   EKG:  EKG is ordered today. The ekg ordered today demonstrates normal sinus rhythm with no significant.  Recent Labs: 03/07/2023: ALT 23; BUN 13; Creatinine, Ser 0.68; Hemoglobin 14.7; Platelets 161; Potassium 4.0; Sodium 135    Lipid Panel    Component Value Date/Time   CHOL 106 03/07/2023 0939   CHOL 95 11/25/2011 1201   TRIG 167 (H) 03/07/2023 0939   TRIG 76 11/25/2011 1201   HDL 23 (L) 03/07/2023 0939    HDL 29 (L) 11/25/2011 1201   CHOLHDL 4.6 03/07/2023 0939   VLDL 33 03/07/2023 0939   VLDL 15 11/25/2011 1201   LDLCALC 50 03/07/2023 0939   LDLCALC 51 11/25/2011 1201   LDLDIRECT 60.1 02/15/2021 1717      Wt Readings from Last 3 Encounters:  09/19/23 166 lb (75.3 kg)  02/28/23 164 lb (74.4 kg)  08/16/22 167 lb 3.2 oz (75.8 kg)       ASSESSMENT AND PLAN:  1.  Coronary artery disease involving native coronary arteries with other forms of angina: The patient has prolonged history of atypical angina.  He seems to be stable on  beta-blocker and long-acting nitroglycerin .  Most recent Lexiscan  Myoview  in 2022 showed no evidence of ischemia.  Continue same medications.  2.  Chronic diastolic heart failure: He appears to be euvolemic.  Torsemide  was discontinued during last visit with initiation of hydrochlorothiazide.    3.  Essential hypertension: Blood pressure is now well-controlled on current medications.  I discontinued atenolol  given that he is on carvedilol .  4. Hyperlipidemia: Continue atorvastatin  and ezetimibe .  Most recent lipid profile in July showed an LDL of 50.  5. Type 2 diabetes: Still not optimally controlled.  Managed by his primary care physician.  5.  Mild bilateral carotid disease: Most recent carotid Doppler showed mild nonobstructive disease less than 40%.  No need to repeat unless there is a clinical indication.   Disposition:   FU with me in 6 months.  Signed,  Scott Cage, MD  09/19/2023 4:00 PM    [facility]

## 2023-09-19 NOTE — Patient Instructions (Signed)
 Medication Instructions:  STOP the Atenolol   *If you need a refill on your cardiac medications before your next appointment, please call your pharmacy*   Lab Work: None ordered If you have labs (blood work) drawn today and your tests are completely normal, you will receive your results only by: MyChart Message (if you have MyChart) OR A paper copy in the mail If you have any lab test that is abnormal or we need to change your treatment, we will call you to review the results.   Testing/Procedures: None ordered   Follow-Up: At [facility], you and your health needs are our priority.  As part of our continuing mission to provide you with exceptional heart care, we have created designated Provider Care Teams.  These Care Teams include your primary Cardiologist (physician) and Advanced Practice Providers (APPs -  Physician Assistants and Nurse Practitioners) who all work together to provide you with the care you need, when you need it.  We recommend signing up for the patient portal called MyChart.  Sign up information is provided on this After Visit Summary.  MyChart is used to connect with patients for Virtual Visits (Telemedicine).  Patients are able to view lab/test results, encounter notes, upcoming appointments, etc.  Non-urgent messages can be sent to your provider as well.   To learn more about what you can do with MyChart, go to [email or web address].    Your next appointment:   6 month(s)  Provider:   You may see Deatrice Cage, MD or one of the following Advanced Practice Providers on your designated Care Team:   Lonni Meager, NP Bernardino Bring, PA-C Cadence Franchester, PA-C Tylene Lunch, NP Barnie Hila, NP

## 2023-10-13 DIAGNOSIS — M26659 Arthropathy of unspecified temporomandibular joint: Secondary | ICD-10-CM | POA: Diagnosis not present

## 2023-10-13 DIAGNOSIS — E119 Type 2 diabetes mellitus without complications: Secondary | ICD-10-CM | POA: Diagnosis not present

## 2023-10-13 DIAGNOSIS — M25519 Pain in unspecified shoulder: Secondary | ICD-10-CM | POA: Diagnosis not present

## 2023-10-13 DIAGNOSIS — M7500 Adhesive capsulitis of unspecified shoulder: Secondary | ICD-10-CM | POA: Diagnosis not present

## 2023-10-13 DIAGNOSIS — I10 Essential (primary) hypertension: Secondary | ICD-10-CM | POA: Diagnosis not present

## 2023-12-19 ENCOUNTER — Ambulatory Visit (INDEPENDENT_AMBULATORY_CARE_PROVIDER_SITE_OTHER): Payer: Self-pay | Admitting: Family Medicine

## 2023-12-19 ENCOUNTER — Other Ambulatory Visit: Payer: Self-pay | Admitting: Family Medicine

## 2023-12-19 ENCOUNTER — Encounter: Payer: Self-pay | Admitting: Family Medicine

## 2023-12-19 VITALS — BP 134/78 | HR 55 | Ht 66.0 in | Wt 163.0 lb

## 2023-12-19 DIAGNOSIS — Z0000 Encounter for general adult medical examination without abnormal findings: Secondary | ICD-10-CM

## 2023-12-19 DIAGNOSIS — R351 Nocturia: Secondary | ICD-10-CM

## 2023-12-19 DIAGNOSIS — Z8673 Personal history of transient ischemic attack (TIA), and cerebral infarction without residual deficits: Secondary | ICD-10-CM | POA: Diagnosis not present

## 2023-12-19 DIAGNOSIS — E782 Mixed hyperlipidemia: Secondary | ICD-10-CM | POA: Diagnosis not present

## 2023-12-19 DIAGNOSIS — I5032 Chronic diastolic (congestive) heart failure: Secondary | ICD-10-CM | POA: Diagnosis not present

## 2023-12-19 DIAGNOSIS — Z794 Long term (current) use of insulin: Secondary | ICD-10-CM | POA: Diagnosis not present

## 2023-12-19 DIAGNOSIS — Z125 Encounter for screening for malignant neoplasm of prostate: Secondary | ICD-10-CM

## 2023-12-19 DIAGNOSIS — E1169 Type 2 diabetes mellitus with other specified complication: Secondary | ICD-10-CM

## 2023-12-19 DIAGNOSIS — I10 Essential (primary) hypertension: Secondary | ICD-10-CM | POA: Diagnosis not present

## 2023-12-19 DIAGNOSIS — Z7689 Persons encountering health services in other specified circumstances: Secondary | ICD-10-CM | POA: Diagnosis not present

## 2023-12-19 MED ORDER — RYBELSUS 3 MG PO TABS: 3.0000 mg | ORAL_TABLET | Freq: Every day | ORAL | Status: DC

## 2023-12-19 MED ORDER — TRESIBA FLEXTOUCH 100 UNIT/ML ~~LOC~~ SOPN: PEN_INJECTOR | SUBCUTANEOUS | 2 refills | Status: DC

## 2023-12-19 NOTE — Patient Instructions (Addendum)
 Thank you for coming to the office today.  New diabetic pill Rybelsus oral tab, 3 mg free sample 30 day trial Before you run out, contact us  1-2 weeks before run out, and we can order the official dose 7mg  upgrade to your pharmacy and get it approved.  STOP Glipizide while on Rybelsus  CONTINUE Jardiance, Metformin, and Insulin.  Eventually once we raise the dose on the Rybelsus, we can start lowering insulin and or possibly metformin.  DUE for FASTING BLOOD WORK (no food or drink after midnight before the lab appointment, only water or coffee without cream/sugar on the morning of)  SCHEDULE "Lab Only" visit in the morning at the clinic for lab draw in 3 MONTHS   - Make sure Lab Only appointment is at about 1 week before your next appointment, so that results will be available  For Lab Results, once available within 2-3 days of blood draw, you can can log in to MyChart online to view your results and a brief explanation. Also, we can discuss results at next follow-up visit.   Please schedule a Follow-up Appointment to: Return for 3 month fasting lab > 1 week later Annual Physical.  If you have any other questions or concerns, please feel free to call the office or send a message through MyChart. You may also schedule an earlier appointment if necessary.  Additionally, you may be receiving a survey about your experience at our office within a few days to 1 week by e-mail or mail. We value your feedback.  Domingo Friend, DO [facility], [place]

## 2023-12-19 NOTE — Progress Notes (Signed)
 Subjective:    Patient ID: Scott Doyle, male    DOB: 1960/10/29, 63 y.o.   MRN: [id number]  Scott Doyle is a 63 y.o. male presenting on 12/19/2023 for Establish Care   HPI  Previous PCP has retired.  Discussed the use of AI scribe software for clinical note transcription with the patient, who gave verbal consent to proceed.  History of Present Illness   Scott Doyle is a 63 year old male with diabetes who presents for establishment of care and medication management.      CAD s/p CABG HLD Followed by [facility] Dr Kirke Corin On medication management ASA 81mg , Atorvastatin 40mg , Zetia 10mg , imdur He underwent heart surgery in 2001, specifically a bypass graft, and mentions being told he is in congestive heart failure now  CHRONIC HTN: Reports Bp controlled Current Meds - Carvedilol 6.25mg  TWICE A DAY, Losartan-hydrochlorothiazide 100-25mg  daily   Reports good compliance, took meds today. Tolerating well, w/o complaints. Denies CP, dyspnea, HA, edema, dizziness / lightheadedness  CHRONIC DM, Type 2: Reports initial diagnosis after CABG 2001, now having diabetes diagnosis >20+ years CBGs: Avg 100-150, Low 99, High 150. Checks CBGs 1 x in AM Meds: Lantus 50u daily, Glipizide XL 2.5mg  daily, Jardiance 10mg  daily, Metformin 1000 TWICE A DAY Lantus no longer covered, will switch to alternative version that is covered. Currently on ARB  Followed by [facility]  Denies hypoglycemia, polyuria, visual changes, numbness or tingling.  History of Bipolar vs Schizoaffective Disorder He describes prior diagnosis circumstances, difficulty to determine accuracy based on history, seems acute episode of agitation anger and behavioral hospitalization but not clear that he has any chronic mood disorder however. Not on medication, not being managed. Not having any current active issues.  Health Maintenance: Future Prevnar-20 vaccine after age 32+  Past Surgical History:   Procedure Laterality Date   CARDIAC CATHETERIZATION     CARDIAC CATHETERIZATION  08/2014   Patent LIMA to LAD and patent diagonal stents.   COLONOSCOPY WITH PROPOFOL N/A 06/06/2022   Procedure: COLONOSCOPY WITH PROPOFOL;  Surgeon: Wyline Mood, MD;  Location: [facility] ENDOSCOPY;  Service: Gastroenterology;  Laterality: N/A;   CORONARY ARTERY BYPASS GRAFT  2001   [facility]        12/19/2023   10:37 AM 05/10/2022    3:57 PM 09/01/2021   12:06 PM  Depression screen PHQ 2/9  Decreased Interest 0 0 0  Down, Depressed, Hopeless 0 0 0  PHQ - 2 Score 0 0 0  Altered sleeping 0    Tired, decreased energy 0    Change in appetite 0    Feeling bad or failure about yourself  0    Trouble concentrating 0    Moving slowly or fidgety/restless 0    Suicidal thoughts 0    PHQ-9 Score 0    Difficult doing work/chores Not difficult at all         12/19/2023   10:37 AM  GAD 7 : Generalized Anxiety Score  Nervous, Anxious, on Edge 0  Control/stop worrying 0  Worry too much - different things 0  Trouble relaxing 0  Restless 0  Easily annoyed or irritable 0  Afraid - awful might happen 0  Total GAD 7 Score 0  Anxiety Difficulty Not difficult at all     Past Medical History:  Diagnosis Date   Anxiety    CHF (congestive heart failure) (HCC)    Coronary artery disease    a. s/p prior CABG x  1 (LIMA->LAD) and PCI to the D2; b. 01/2012 PCI: patent prox Diag stent w/ sev distal dzs->DES x 1; c. 08/2014 Cath: LM nl, LAD 86m, D2 patent stents w/ 82m, LCX nl, RCA 40p, LIMA->LAD nl, EF 60%.   CVA (cerebral vascular accident) (HCC)    Diabetes mellitus    Hyperlipidemia    Hypertension    Thrombocytopathia [facility])    Past Surgical History:  Procedure Laterality Date   CARDIAC CATHETERIZATION     CARDIAC CATHETERIZATION  08/2014   Patent LIMA to LAD and patent diagonal stents.   COLONOSCOPY WITH PROPOFOL N/A 06/06/2022   Procedure: COLONOSCOPY WITH PROPOFOL;  Surgeon: Wyline Mood, MD;  Location: [facility]  ENDOSCOPY;  Service: Gastroenterology;  Laterality: N/A;   CORONARY ARTERY BYPASS GRAFT  2001   [facility]   Social History   Socioeconomic History   Marital status: Married    Spouse name: Scott Doyle   Number of children: Not on file   Years of education: Not on file   Highest education level: GED or equivalent  Occupational History   Not on file  Tobacco Use   Smoking status: Former    Current packs/day: 0.00    Types: Cigarettes    Quit date: 2001    Years since quitting: 24.3   Smokeless tobacco: Never  Vaping Use   Vaping status: Never Used  Substance and Sexual Activity   Alcohol use: Yes    Comment: 1-2 glasses of wine occassionally   Drug use: No   Sexual activity: Not Currently  Other Topics Concern   Not on file  Social History Narrative   Not on file   Social Drivers of Health   Financial Resource Strain: Low Risk  (09/01/2021)   Overall Financial Resource Strain (CARDIA)    Difficulty of Paying Living Expenses: Not very hard  Food Insecurity: No Food Insecurity (09/01/2021)   Hunger Vital Sign    Worried About Running Out of Food in the Last Year: Never true    Ran Out of Food in the Last Year: Never true  Transportation Needs: No Transportation Needs (09/01/2021)   PRAPARE - Administrator, Civil Service (Medical): No    Lack of Transportation (Non-Medical): No  Physical Activity: Sufficiently Active (09/01/2021)   Exercise Vital Sign    Days of Exercise per Week: 5 days    Minutes of Exercise per Session: 30 min  Stress: No Stress Concern Present (09/01/2021)   [employer] of Occupational Health - Occupational Stress Questionnaire    Feeling of Stress : Not at all  Social Connections: Socially Integrated (09/01/2021)   Social Connection and Isolation Panel [NHANES]    Frequency of Communication with Friends and Family: More than three times a week    Frequency of Social Gatherings with Friends and Family: More than three times a week     Attends Religious Services: More than 4 times per year    Active Member of [employer] or Organizations: Yes    Attends Engineer, structural: More than 4 times per year    Marital Status: Married  Catering manager Violence: Not At Risk (09/01/2021)   Humiliation, Afraid, Rape, and Kick questionnaire    Fear of Current or Ex-Partner: No    Emotionally Abused: No    Physically Abused: No    Sexually Abused: No   Family History  Problem Relation Age of Onset   Hypertension Mother    Heart disease Mother  Diabetes Other        fx   Cancer Other        mat. family   Current Outpatient Medications on File Prior to Visit  Medication Sig   ACCU-CHEK AVIVA PLUS test strip USE ONE STRIP DAILY AS DIRECTED   aspirin 81 MG tablet Take 81 mg by mouth daily.   atorvastatin (LIPITOR) 40 MG tablet TAKE 1 TABLET BY MOUTH EVERY DAY   carvedilol (COREG) 6.25 MG tablet TAKE 1 TABLET BY MOUTH TWICE A DAY   clopidogrel (PLAVIX) 75 MG tablet Take 1 tablet (75 mg total) by mouth daily.   empagliflozin (JARDIANCE) 10 MG TABS tablet TAKE 1 TABLET BY MOUTH EVERY DAY BEFORE BREAKFAST   ezetimibe (ZETIA) 10 MG tablet TAKE 1 TABLET BY MOUTH EVERY DAY   Insulin Pen Needle (PEN NEEDLES) 33G X 4 MM MISC Use to administer insulin daily   isosorbide mononitrate (IMDUR) 120 MG 24 hr tablet TAKE 1 TABLET BY MOUTH EVERY DAY   LANTUS SOLOSTAR 100 UNIT/ML Solostar Pen Inject 50 Units into the skin daily as needed (blood glucose >150).   losartan-hydrochlorothiazide (HYZAAR) 100-25 MG tablet TAKE 1 TABLET BY MOUTH EVERY DAY   metFORMIN (GLUCOPHAGE) 1000 MG tablet TAKE 1 TABLET BY MOUTH TWICE A DAY   nitroGLYCERIN (NITROSTAT) 0.4 MG SL tablet Place 1 tablet (0.4 mg total) under the tongue every 5 (five) minutes as needed. Maximum of 3 doses.   No current facility-administered medications on file prior to visit.    Review of Systems Per HPI unless specifically indicated above     Objective:    BP 134/78 (BP  Location: Right Arm, Patient Position: Sitting, Cuff Size: Normal)   Pulse (!) 55   Ht 5\' 6"  (1.676 m)   Wt 163 lb (73.9 kg)   SpO2 99%   BMI 26.31 kg/m   Wt Readings from Last 3 Encounters:  12/19/23 163 lb (73.9 kg)  09/19/23 166 lb (75.3 kg)  02/28/23 164 lb (74.4 kg)    Physical Exam Vitals and nursing note reviewed.  Constitutional:      General: He is not in acute distress.    Appearance: He is well-developed. He is not diaphoretic.     Comments: Well-appearing, comfortable, cooperative  HENT:     Head: Normocephalic and atraumatic.  Eyes:     General:        Right eye: No discharge.        Left eye: No discharge.     Conjunctiva/sclera: Conjunctivae normal.  Neck:     Thyroid: No thyromegaly.  Cardiovascular:     Rate and Rhythm: Normal rate and regular rhythm.     Pulses: Normal pulses.     Heart sounds: Normal heart sounds. No murmur heard. Pulmonary:     Effort: Pulmonary effort is normal. No respiratory distress.     Breath sounds: Normal breath sounds. No wheezing or rales.  Musculoskeletal:        General: Normal range of motion.     Cervical back: Normal range of motion and neck supple.  Lymphadenopathy:     Cervical: No cervical adenopathy.  Skin:    General: Skin is warm and dry.     Findings: No erythema or rash.  Neurological:     Mental Status: He is alert and oriented to person, place, and time. Mental status is at baseline.  Psychiatric:        Behavior: Behavior normal.     Comments: Well  groomed, good eye contact, normal speech and thoughts     Diabetic Foot Exam - Simple   Simple Foot Form Diabetic Foot exam was performed with the following findings: Yes 12/19/2023 10:40 AM  Visual Inspection See comments: Yes Sensation Testing Intact to touch and monofilament testing bilaterally: Yes Pulse Check Posterior Tibialis and Dorsalis pulse intact bilaterally: Yes Comments Mild callus formation bilateral feet forefoot great toes and heels       Results for orders placed or performed during the hospital encounter of 03/07/23  Hemoglobin A1c   Collection Time: 03/07/23  9:39 AM  Result Value Ref Range   Hgb A1c MFr Bld 9.3 (H) 4.8 - 5.6 %   Mean Plasma Glucose 220 mg/dL  Lipid panel   Collection Time: 03/07/23  9:39 AM  Result Value Ref Range   Cholesterol 106 0 - 200 mg/dL   Triglycerides 161 (H) <150 mg/dL   HDL 23 (L) >09 mg/dL   Total CHOL/HDL Ratio 4.6 RATIO   VLDL 33 0 - 40 mg/dL   LDL Cholesterol 50 0 - 99 mg/dL  Comprehensive metabolic panel   Collection Time: 03/07/23  9:39 AM  Result Value Ref Range   Sodium 135 135 - 145 mmol/L   Potassium 4.0 3.5 - 5.1 mmol/L   Chloride 102 98 - 111 mmol/L   CO2 24 22 - 32 mmol/L   Glucose, Bld 195 (H) 70 - 99 mg/dL   BUN 13 8 - 23 mg/dL   Creatinine, Ser 6.04 0.61 - 1.24 mg/dL   Calcium 8.7 (L) 8.9 - 10.3 mg/dL   Total Protein 7.0 6.5 - 8.1 g/dL   Albumin 4.0 3.5 - 5.0 g/dL   AST 23 15 - 41 U/L   ALT 23 0 - 44 U/L   Alkaline Phosphatase 65 38 - 126 U/L   Total Bilirubin 0.9 0.3 - 1.2 mg/dL   GFR, Estimated >54 >09 mL/min   Anion gap 9 5 - 15  CBC   Collection Time: 03/07/23  9:39 AM  Result Value Ref Range   WBC 6.7 4.0 - 10.5 K/uL   RBC 4.86 4.22 - 5.81 MIL/uL   Hemoglobin 14.7 13.0 - 17.0 g/dL   HCT 81.1 91.4 - 78.2 %   MCV 87.4 80.0 - 100.0 fL   MCH 30.2 26.0 - 34.0 pg   MCHC 34.6 30.0 - 36.0 g/dL   RDW 95.6 21.3 - 08.6 %   Platelets 161 150 - 400 K/uL   nRBC 0.0 0.0 - 0.2 %      Assessment & Plan:   Problem List Items Addressed This Visit     Chronic diastolic CHF (congestive heart failure) (HCC)   Essential hypertension   History of CVA (cerebrovascular accident) without residual deficits   Mixed hyperlipidemia   Type 2 diabetes mellitus with other specified complication (HCC) - Primary   Relevant Medications   RYBELSUS 3 MG TABS   Other Visit Diagnoses       Encounter to establish care with new doctor            Establish  Care   Type 2 Diabetes Mellitus Long-standing Type 2 Diabetes Mellitus. Current regimen outdated with sulfonylurea + insulin. Transition to newer medications with cardiovascular benefits and potential weight loss planned.  Oral GLP1 Rybelsus proposed as alternative to insulin.  - Switch from Lantus to Tresiba 50u due to insurance coverage. - Initiate Rybelsus 3 mg daily, 30 day free sample today. Notify office 2+ weeks, new  order Rybelsus 7mg  daily will need PA - Discontinue glipizide while on Rybelsus. - Continue metformin and Jardiance. - Maintain insulin until Rybelsus dose is optimized >7mg , then will taper insulin - Schedule follow-up in 3 months to assess blood sugar control and medication efficacy. - Perform blood and urine tests at next visit to monitor diabetes-related complications.  Cardiovascular Disease Cardiovascular disease managed with atorvastatin, carvedilol, and Plavix. No current deficits from past stroke. - Continue current cardiovascular medications as prescribed by the cardiologist.  History Bipolar Disorder Diagnosed with bipolar disorder, no current symptoms or treatment required. Uncertain accuracy of diagnosis  General Health Maintenance Due for routine vaccinations and screenings. Approaching age 40, milestone for certain vaccinations. - Administer Prevnar 20 vaccine after age 29. - Update tetanus vaccine, last administered in 2010 - decline today - Discuss shingles vaccination, informed of benefits. - [facility] due to diabetes. - Plan for routine blood and urine tests to monitor overall health.        No orders of the defined types were placed in this encounter.   Meds ordered this encounter  Medications   RYBELSUS 3 MG TABS    Sig: Take 1 tablet (3 mg total) by mouth daily.     Follow up plan: Return for 3 month fasting lab > 1 week later Annual Physical.  Future 03/21/24 Labs + Urine  microalbumin  Domingo Friend, DO [facility] 12/19/2023, 10:08 AM

## 2024-01-05 ENCOUNTER — Other Ambulatory Visit: Payer: Self-pay | Admitting: Family Medicine

## 2024-01-05 DIAGNOSIS — E1169 Type 2 diabetes mellitus with other specified complication: Secondary | ICD-10-CM

## 2024-01-05 NOTE — Telephone Encounter (Signed)
 Copied from CRM [id number]. Topic: Clinical - Medication Refill >> Jan 05, 2024 11:50 AM Essie A wrote: Most Recent Primary Care Visit:  Provider: Raina Bunting  Department: [facility]  Visit Type: NEW PATIENT  Date: 12/19/2023  Medication: RYBELSUS  3 MG TABS. Doctor told him to call in the order but he will need it changed to 7 MG tabs.  He was given samples so this is a new prescription.  Has the patient contacted their pharmacy? No (Agent: If no, request that the patient contact the pharmacy for the refill. If patient does not wish to contact the pharmacy document the reason why and proceed with request.) (Agent: If yes, when and what did the pharmacy advise?)  Is this the correct pharmacy for this prescription? Yes If no, delete pharmacy and type the correct one.  This is the patient's preferred pharmacy:  [id number] [place], [place] - [address] [place] [address] Phone: [phone] Fax: [phone]   Has the prescription been filled recently? No  Is the patient out of the medication? No  Has the patient been seen for an appointment in the last year OR does the patient have an upcoming appointment? Yes  Can we respond through MyChart? Yes  Agent: Please be advised that Rx refills may take up to 3 business days. We ask that you follow-up with your pharmacy.

## 2024-01-08 MED ORDER — RYBELSUS 3 MG PO TABS
3.0000 mg | ORAL_TABLET | Freq: Every day | ORAL | 1 refills | Status: DC
Start: 2024-01-08 — End: 2024-03-26

## 2024-01-08 NOTE — Telephone Encounter (Signed)
 Requested Prescriptions  Pending Prescriptions Disp Refills   RYBELSUS  3 MG TABS 90 tablet 1    Sig: Take 1 tablet (3 mg total) by mouth daily.     Off-Protocol Failed - 01/08/2024  8:36 PM      Failed - Medication not assigned to a protocol, review manually.      Passed - Valid encounter within last 12 months    Recent Outpatient Visits           2 weeks ago Type 2 diabetes mellitus with other specified complication, with long-term current use of insulin  [facility])   [place] [facility] [place], Kayleen Party, [place]

## 2024-01-24 ENCOUNTER — Other Ambulatory Visit: Payer: Self-pay | Admitting: Family Medicine

## 2024-01-24 MED ORDER — CLOPIDOGREL BISULFATE 75 MG PO TABS: 75.0000 mg | ORAL_TABLET | Freq: Every day | ORAL | 3 refills | Status: AC

## 2024-01-24 NOTE — Telephone Encounter (Signed)
 Copied from CRM [id number]. Topic: Clinical - Medication Refill >> Jan 24, 2024 11:15 AM Alpha Arts wrote: Medication: clopidogrel  (PLAVIX ) 75 MG tablet   Has the patient contacted their pharmacy? Yes (Agent: If no, request that the patient contact the pharmacy for the refill. If patient does not wish to contact the pharmacy document the reason why and proceed with request.) (Agent: If yes, when and what did the pharmacy advise?)  This is the patient's preferred pharmacy:  [id number] - [place], [place] - [address] [place] [address] Phone: [phone] Fax: [phone]  Is this the correct pharmacy for this prescription? Yes If no, delete pharmacy and type the correct one.   Has the prescription been filled recently? Yes  Is the patient out of the medication? No  Has the patient been seen for an appointment in the last year OR does the patient have an upcoming appointment? Yes  Can we respond through MyChart? Yes  Agent: Please be advised that Rx refills may take up to 3 business days. We ask that you follow-up with your pharmacy.

## 2024-02-12 ENCOUNTER — Other Ambulatory Visit: Payer: Self-pay | Admitting: Family Medicine

## 2024-02-12 MED ORDER — LOSARTAN POTASSIUM-HCTZ 100-25 MG PO TABS
1.0000 | ORAL_TABLET | Freq: Every day | ORAL | 1 refills | Status: AC
Start: 2024-02-12 — End: ?

## 2024-02-12 NOTE — Telephone Encounter (Signed)
 Requested medication (s) are due for refill today: Yes  Requested medication (s) are on the active medication list: Yes  Last refill:  06/13/23  Future visit scheduled: Yes  Notes to clinic:  Unable to refill per protocol, last refill by another provider.      Requested Prescriptions  Pending Prescriptions Disp Refills   losartan -hydrochlorothiazide (HYZAAR) 100-25 MG tablet 90 tablet 1    Sig: Take 1 tablet by mouth daily.     Cardiovascular: ARB + Diuretic Combos Failed - 02/12/2024  4:34 PM      Failed - K in normal range and within 180 days    Potassium  Date Value Ref Range Status  03/07/2023 4.0 3.5 - 5.1 mmol/L Final  08/06/2014 3.7 3.5 - 5.1 mmol/L Final         Failed - Na in normal range and within 180 days    Sodium  Date Value Ref Range Status  03/07/2023 135 135 - 145 mmol/L Final  08/03/2021 141 134 - 144 mmol/L Final  08/06/2014 138 136 - 145 mmol/L Final         Failed - Cr in normal range and within 180 days    Creatinine  Date Value Ref Range Status  08/06/2014 1.01 0.60 - 1.30 mg/dL Final   Creatinine, Ser  Date Value Ref Range Status  03/07/2023 0.68 0.61 - 1.24 mg/dL Final         Failed - eGFR is 10 or above and within 180 days    EGFR (African American)  Date Value Ref Range Status  08/06/2014 >60 >37mL/min Final  06/26/2013 >60  Final   GFR calc Af Amer  Date Value Ref Range Status  12/31/2017 >60 >60 mL/min Final    Comment:    (NOTE) The eGFR has been calculated using the CKD EPI equation. This calculation has not been validated in all clinical situations. eGFR's persistently <60 mL/min signify possible Chronic Kidney Disease.    EGFR (Non-African Amer.)  Date Value Ref Range Status  08/06/2014 >60 >52mL/min Final    Comment:    eGFR values <13mL/min/1.73 m2 may be an indication of chronic kidney disease (CKD). Calculated eGFR, using the MRDR Study equation, is useful in  patients with stable renal function. The eGFR  calculation will not be reliable in acutely ill patients when serum creatinine is changing rapidly. It is not useful in patients on dialysis. The eGFR calculation may not be applicable to patients at the low and high extremes of body sizes, pregnant women, and vegetarians.   06/26/2013 >60  Final    Comment:    eGFR values <39mL/min/1.73 m2 may be an indication of chronic kidney disease (CKD). Calculated eGFR is useful in patients with stable renal function. The eGFR calculation will not be reliable in acutely ill patients when serum creatinine is changing rapidly. It is not useful in  patients on dialysis. The eGFR calculation may not be applicable to patients at the low and high extremes of body sizes, pregnant women, and vegetarians.    GFR, Estimated  Date Value Ref Range Status  03/07/2023 >60 >60 mL/min Final    Comment:    (NOTE) Calculated using the CKD-EPI Creatinine Equation (2021)    eGFR  Date Value Ref Range Status  08/03/2021 73 >59 mL/min/1.73 Final         Passed - Patient is not pregnant      Passed - Last BP in normal range    BP Readings from  Last 1 Encounters:  12/19/23 134/78         Passed - Valid encounter within last 6 months    Recent Outpatient Visits           1 month ago Type 2 diabetes mellitus with other specified complication, with long-term current use of insulin  [facility])   [place] [facility] [place], Kayleen [place], [place]

## 2024-02-12 NOTE — Telephone Encounter (Signed)
 Copied from CRM [id number]. Topic: Clinical - Medication Refill >> Feb 12, 2024  1:05 PM Turkey B wrote: Medication: losartan -hydrochlorothiazide (HYZAAR) 100-25 MG tablet Caller from [facility] health requesting a 100 day supply for pt  Has the patient contacted their pharmacy? yes (Agent: If yes, when and what did the pharmacy advise?)pharmacy called in directly  This is the patient's preferred pharmacy:   [id number] [place] [facility] - [address] [place] [address] Phone: [phone] Fax: [phone]  Is this the correct pharmacy for this prescription? yes   Has the prescription been filled recently? no  Is the patient out of the medication? No hs 2 left  Has the patient been seen for an appointment in the last year OR does the patient have an upcoming appointment? yes  Can we respond through MyChart? yes  Agent: Please be advised that Rx refills may take up to 3 business days. We ask that you follow-up with your pharmacy.

## 2024-02-13 ENCOUNTER — Other Ambulatory Visit: Payer: Self-pay | Admitting: Cardiovascular Disease

## 2024-02-19 ENCOUNTER — Ambulatory Visit: Payer: Self-pay | Admitting: Internal Medicine

## 2024-02-19 DIAGNOSIS — J069 Acute upper respiratory infection, unspecified: Secondary | ICD-10-CM | POA: Diagnosis not present

## 2024-02-19 NOTE — Telephone Encounter (Signed)
 FYI Only or Action Required?: FYI only for provider  Patient was last seen in primary care on 12/19/2023 by Raina Bunting, DO. Called Nurse Triage reporting Cough. Symptoms began a week ago. Symptoms are: gradually worsening.  Triage Disposition: See Physician Within 24 Hours  Patient/caregiver understands and will follow disposition?: Yes           Copied from CRM [id number]. Topic: Clinical - Red Word Triage >> Feb 19, 2024  8:56 AM Elle L wrote: Red Word that prompted transfer to Nurse Triage: The patient is requesting a Z-Pack as he states that he has a head cold that started a week ago that is now in his chest and he has a worsening productive cough. Reason for Disposition  SEVERE coughing spells (e.g., whooping sound after coughing, vomiting after coughing)  Answer Assessment - Initial Assessment Questions This RN advised patient to go to urgent care as no appointment availability in office. Patient agreeable.   Productive cough x1 week Sputum- green Denies fever, hemoptysis, difficulty breathing, chest pain Pain with cough in bottom of throat and into lungs  Protocols used: Cough - Acute Productive-A-AH

## 2024-03-16 ENCOUNTER — Other Ambulatory Visit: Payer: Self-pay | Admitting: Family Medicine

## 2024-03-16 DIAGNOSIS — E1169 Type 2 diabetes mellitus with other specified complication: Secondary | ICD-10-CM

## 2024-03-18 NOTE — Telephone Encounter (Signed)
 Appointment 03/21/24 Requested Prescriptions  Pending Prescriptions Disp Refills   TRESIBA  FLEXTOUCH 100 UNIT/ML FlexTouch Pen [Pharmacy Med Name: TRESIBA  FLEXTOUCH 100 UNIT/ML] 15 mL 0    Sig: INJECT UP TO 50 UNITS INTO THE SKIN DAILY.     Endocrinology:  Diabetes - Insulins Failed - 03/18/2024  4:15 PM      Failed - HBA1C is between 0 and 7.9 and within 180 days    Hemoglobin A1C  Date Value Ref Range Status  11/25/2011 6.8 (H) 4.2 - 6.3 % Final    Comment:    The American Diabetes Association recommends that a primary goal of therapy should be <7% and that physicians should reevaluate the treatment regimen in patients with HbA1c values consistently >8%.    HbA1c POC (<> result, manual entry)  Date Value Ref Range Status  05/10/2022 7.5 4.0 - 5.6 % Final   Hgb A1c MFr Bld  Date Value Ref Range Status  03/07/2023 9.3 (H) 4.8 - 5.6 % Final    Comment:    (NOTE)         Prediabetes: 5.7 - 6.4         Diabetes: >6.4         Glycemic control for adults with diabetes: <7.0          Passed - Valid encounter within last 6 months    Recent Outpatient Visits           3 months ago Type 2 diabetes mellitus with other specified complication, with long-term current use of insulin  [facility])   [place] [facility] [place], Marsa [place], [place]

## 2024-03-21 ENCOUNTER — Other Ambulatory Visit

## 2024-03-21 DIAGNOSIS — E782 Mixed hyperlipidemia: Secondary | ICD-10-CM

## 2024-03-21 DIAGNOSIS — R351 Nocturia: Secondary | ICD-10-CM | POA: Diagnosis not present

## 2024-03-21 DIAGNOSIS — Z125 Encounter for screening for malignant neoplasm of prostate: Secondary | ICD-10-CM

## 2024-03-21 DIAGNOSIS — Z8673 Personal history of transient ischemic attack (TIA), and cerebral infarction without residual deficits: Secondary | ICD-10-CM

## 2024-03-21 DIAGNOSIS — I10 Essential (primary) hypertension: Secondary | ICD-10-CM | POA: Diagnosis not present

## 2024-03-21 DIAGNOSIS — E1169 Type 2 diabetes mellitus with other specified complication: Secondary | ICD-10-CM | POA: Diagnosis not present

## 2024-03-21 DIAGNOSIS — Z0000 Encounter for general adult medical examination without abnormal findings: Secondary | ICD-10-CM | POA: Diagnosis not present

## 2024-03-21 DIAGNOSIS — Z794 Long term (current) use of insulin: Secondary | ICD-10-CM | POA: Diagnosis not present

## 2024-03-21 LAB — COMPREHENSIVE METABOLIC PANEL WITH GFR
AG Ratio: 1.9 (calc) (ref 1.0–2.5)
ALT: 15 U/L (ref 9–46)
AST: 14 U/L (ref 10–35)
Albumin: 4.5 g/dL (ref 3.6–5.1)
Alkaline phosphatase (APISO): 60 U/L (ref 35–144)
BUN/Creatinine Ratio: 16 (calc) (ref 6–22)
BUN: 11 mg/dL (ref 7–25)
CO2: 24 mmol/L (ref 20–32)
Calcium: 9.3 mg/dL (ref 8.6–10.3)
Chloride: 102 mmol/L (ref 98–110)
Creat: 0.69 mg/dL — ABNORMAL LOW (ref 0.70–1.35)
Globulin: 2.4 g/dL (ref 1.9–3.7)
Glucose, Bld: 141 mg/dL — ABNORMAL HIGH (ref 65–99)
Potassium: 4.2 mmol/L (ref 3.5–5.3)
Sodium: 137 mmol/L (ref 135–146)
Total Bilirubin: 0.7 mg/dL (ref 0.2–1.2)
Total Protein: 6.9 g/dL (ref 6.1–8.1)
eGFR: 103 mL/min/1.73m2 (ref >=60)

## 2024-03-21 LAB — CBC WITH DIFFERENTIAL/PLATELET
Absolute Lymphocytes: 1859 {cells}/uL (ref 850–3900)
Absolute Monocytes: 481 {cells}/uL (ref 200–950)
Basophils Absolute: 52 {cells}/uL (ref 0–200)
Basophils Relative: 0.8 %
Eosinophils Absolute: 221 {cells}/uL (ref 15–500)
Eosinophils Relative: 3.4 %
HCT: 43.6 % (ref 38.5–50.0)
Hemoglobin: 14.6 g/dL (ref 13.2–17.1)
MCH: 29.1 pg (ref 27.0–33.0)
MCHC: 33.5 g/dL (ref 32.0–36.0)
MCV: 87 fL (ref 80.0–100.0)
MPV: 11.1 fL (ref 7.5–12.5)
Monocytes Relative: 7.4 %
Neutro Abs: 3887 {cells}/uL (ref 1500–7800)
Neutrophils Relative %: 59.8 %
Platelets: 154 Thousand/uL (ref 140–400)
RBC: 5.01 Million/uL (ref 4.20–5.80)
RDW: 13.1 % (ref 11.0–15.0)
Total Lymphocyte: 28.6 %
WBC: 6.5 Thousand/uL (ref 3.8–10.8)

## 2024-03-21 LAB — PSA: PSA: 0.29 ng/mL (ref <=4.00)

## 2024-03-21 LAB — LIPID PANEL
Cholesterol: 107 mg/dL (ref <200)
HDL: 29 mg/dL — ABNORMAL LOW (ref >=40)
LDL Cholesterol (Calc): 54 mg/dL
Non-HDL Cholesterol (Calc): 78 mg/dL (ref <130)
Total CHOL/HDL Ratio: 3.7 (calc) (ref <5.0)
Triglycerides: 158 mg/dL — ABNORMAL HIGH (ref <150)

## 2024-03-21 LAB — HEMOGLOBIN A1C
Hgb A1c MFr Bld: 8.7 % — ABNORMAL HIGH (ref <5.7)
Mean Plasma Glucose: 203 mg/dL
eAG (mmol/L): 11.2 mmol/L

## 2024-03-21 LAB — TSH: TSH: 1.54 m[IU]/L (ref 0.40–4.50)

## 2024-03-22 LAB — MICROALBUMIN / CREATININE URINE RATIO
Creatinine, Urine: 117 mg/dL (ref 20–320)
Microalb Creat Ratio: 12 mg/g{creat} (ref <30)
Microalb, Ur: 1.4 mg/dL

## 2024-03-26 ENCOUNTER — Encounter: Payer: Self-pay | Admitting: Family Medicine

## 2024-03-26 ENCOUNTER — Ambulatory Visit (INDEPENDENT_AMBULATORY_CARE_PROVIDER_SITE_OTHER): Admitting: Family Medicine

## 2024-03-26 VITALS — BP 132/60 | HR 65 | Ht 66.0 in | Wt 151.2 lb

## 2024-03-26 DIAGNOSIS — E782 Mixed hyperlipidemia: Secondary | ICD-10-CM

## 2024-03-26 DIAGNOSIS — I10 Essential (primary) hypertension: Secondary | ICD-10-CM

## 2024-03-26 DIAGNOSIS — E1169 Type 2 diabetes mellitus with other specified complication: Secondary | ICD-10-CM

## 2024-03-26 DIAGNOSIS — Z8673 Personal history of transient ischemic attack (TIA), and cerebral infarction without residual deficits: Secondary | ICD-10-CM

## 2024-03-26 DIAGNOSIS — Z23 Encounter for immunization: Secondary | ICD-10-CM | POA: Diagnosis not present

## 2024-03-26 DIAGNOSIS — Z794 Long term (current) use of insulin: Secondary | ICD-10-CM | POA: Diagnosis not present

## 2024-03-26 DIAGNOSIS — Z7984 Long term (current) use of oral hypoglycemic drugs: Secondary | ICD-10-CM | POA: Diagnosis not present

## 2024-03-26 DIAGNOSIS — Z0000 Encounter for general adult medical examination without abnormal findings: Secondary | ICD-10-CM | POA: Diagnosis not present

## 2024-03-26 MED ORDER — RYBELSUS 7 MG PO TABS: 7.0000 mg | ORAL_TABLET | Freq: Every day | ORAL | 0 refills | Status: DC

## 2024-03-26 NOTE — Progress Notes (Addendum)
 Subjective:    Patient ID: Scott Doyle, male    DOB: 1961/04/07, 63 y.o.   MRN: [id number]  Scott Doyle is a 63 y.o. male presenting on 03/26/2024 for Annual Exam   HPI  Discussed the use of AI scribe software for clinical note transcription with the patient, who gave verbal consent to proceed.  History of Present Illness   Scott Doyle is a 63 year old male with type 2 diabetes who presents for an annual wellness checkup.  History of CVA  BPH LUTS Lower urinary tract symptoms History of enlarged prostate Normal PSA - Slow urinary stream - Previously used Flomax  after heart surgery in 2001 No longer on med.  Hyperlipidemia - History of high cholesterol - Currently managed with atorvastatin  - LDL 54 mg/dL - Triglycerides approximately 150 mg/dL    CAD s/p CABG HLD Followed by [facility] Cardiology Dr Darron On medication management ASA 81mg , Atorvastatin  40mg , Zetia  10mg , imdur  He underwent heart surgery in 2001, specifically a bypass graft, and mentions being told he is in congestive heart failure now   CHRONIC HTN: Reports Bp controlled Current Meds - Carvedilol  6.25mg  TWICE A DAY, Losartan -hydrochlorothiazide 100-25mg  daily   Reports good compliance, took meds today. Tolerating well, w/o complaints. Denies CP, dyspnea, HA, edema, dizziness / lightheadedness   CHRONIC DM, Type 2: - Type 2 diabetes managed with Rybelsus , metformin , Jardiance , and Tresiba  insulin  - A1c improved from 9.3% to 8.7% over the past three months - Tresiba  insulin  dose adjusted based on morning blood sugar, typically 50 units if blood sugar >150 mg/dL - Daily morning blood glucose monitoring Meds: Tresiba  50u daily, Rybelsus  3mg  daily, Jardiance  10mg  daily, Metformin  1000 TWICE A DAY - Off Glipizide  Currently on ARB  Followed by [facility]  Denies hypoglycemia, polyuria, visual changes, numbness or tingling.   History of Bipolar vs Schizoaffective Disorder He describes prior  diagnosis circumstances, difficulty to determine accuracy based on history, seems acute episode of agitation anger and behavioral hospitalization but not clear that he has any chronic mood disorder however. Not on medication, not being managed. Not having any current active issues.    Health Maintenance: Today Prevnar-20 vaccine 65+  Future Shingrix  Colonoscopy 06/06/22 repeat 3 years, 2026  PSA 0.29 negative.      03/26/2024    3:40 PM 12/19/2023   10:37 AM 05/10/2022    3:57 PM  Depression screen PHQ 2/9  Decreased Interest 0 0 0  Down, Depressed, Hopeless 0 0 0  PHQ - 2 Score 0 0 0  Altered sleeping  0   Tired, decreased energy  0   Change in appetite  0   Feeling bad or failure about yourself   0   Trouble concentrating  0   Moving slowly or fidgety/restless  0   Suicidal thoughts  0   PHQ-9 Score  0   Difficult doing work/chores  Not difficult at all        03/26/2024    3:40 PM 12/19/2023   10:37 AM  GAD 7 : Generalized Anxiety Score  Nervous, Anxious, on Edge 0 0  Control/stop worrying 0 0  Worry too much - different things 0 0  Trouble relaxing 0 0  Restless 0 0  Easily annoyed or irritable 0 0  Afraid - awful might happen 0 0  Total GAD 7 Score 0 0  Anxiety Difficulty  Not difficult at all     Past Medical History:  Diagnosis Date  Anxiety    CHF (congestive heart failure) (HCC)    Coronary artery disease    a. s/p prior CABG x 1 (LIMA->LAD) and PCI to the D2; b. 01/2012 PCI: patent prox Diag stent w/ sev distal dzs->DES x 1; c. 08/2014 Cath: LM nl, LAD 69m, D2 patent stents w/ 69m, LCX nl, RCA 40p, LIMA->LAD nl, EF 60%.   CVA (cerebral vascular accident) (HCC)    Diabetes mellitus    Hyperlipidemia    Hypertension    Thrombocytopathia [facility])    Past Surgical History:  Procedure Laterality Date   CARDIAC CATHETERIZATION     CARDIAC CATHETERIZATION  08/2014   Patent LIMA to LAD and patent diagonal stents.   COLONOSCOPY WITH PROPOFOL  N/A 06/06/2022    Procedure: COLONOSCOPY WITH PROPOFOL ;  Surgeon: Therisa Bi, MD;  Location: [facility] ENDOSCOPY;  Service: Gastroenterology;  Laterality: N/A;   CORONARY ARTERY BYPASS GRAFT  2001   [facility]   Social History   Socioeconomic History   Marital status: Married    Spouse name: GIOMAR GUSLER   Number of children: Not on file   Years of education: Not on file   Highest education level: GED or equivalent  Occupational History   Not on file  Tobacco Use   Smoking status: Former    Current packs/day: 0.00    Types: Cigarettes    Quit date: 2001    Years since quitting: 24.5   Smokeless tobacco: Never  Vaping Use   Vaping status: Never Used  Substance and Sexual Activity   Alcohol use: Yes    Comment: 1-2 glasses of wine occassionally   Drug use: No   Sexual activity: Not Currently  Other Topics Concern   Not on file  Social History Narrative   Not on file   Social Drivers of Health   Financial Resource Strain: Low Risk  (09/01/2021)   Overall Financial Resource Strain (CARDIA)    Difficulty of Paying Living Expenses: Not very hard  Food Insecurity: No Food Insecurity (09/01/2021)   Hunger Vital Sign    Worried About Running Out of Food in the Last Year: Never true    Ran Out of Food in the Last Year: Never true  Transportation Needs: No Transportation Needs (09/01/2021)   PRAPARE - Administrator, Civil Service (Medical): No    Lack of Transportation (Non-Medical): No  Physical Activity: Sufficiently Active (09/01/2021)   Exercise Vital Sign    Days of Exercise per Week: 5 days    Minutes of Exercise per Session: 30 min  Stress: No Stress Concern Present (09/01/2021)   [employer] of Occupational Health - Occupational Stress Questionnaire    Feeling of Stress : Not at all  Social Connections: Socially Integrated (09/01/2021)   Social Connection and Isolation Panel    Frequency of Communication with Friends and Family: More than three times a week    Frequency of  Social Gatherings with Friends and Family: More than three times a week    Attends Religious Services: More than 4 times per year    Active Member of [employer] or Organizations: Yes    Attends Banker Meetings: More than 4 times per year    Marital Status: Married  Catering manager Violence: Not At Risk (09/01/2021)   Humiliation, Afraid, Rape, and Kick questionnaire    Fear of Current or Ex-Partner: No    Emotionally Abused: No    Physically Abused: No    Sexually Abused: No  Family History  Problem Relation Age of Onset   Hypertension Mother    Heart disease Mother    Diabetes Other        fx   Cancer Other        mat. family   Current Outpatient Medications on File Prior to Visit  Medication Sig   ACCU-CHEK AVIVA PLUS test strip USE ONE STRIP DAILY AS DIRECTED   aspirin  81 MG tablet Take 81 mg by mouth daily.   atorvastatin  (LIPITOR) 40 MG tablet TAKE 1 TABLET BY MOUTH EVERY DAY   carvedilol  (COREG ) 6.25 MG tablet TAKE 1 TABLET BY MOUTH TWICE A DAY   clopidogrel  (PLAVIX ) 75 MG tablet Take 1 tablet (75 mg total) by mouth daily.   empagliflozin  (JARDIANCE ) 10 MG TABS tablet TAKE 1 TABLET BY MOUTH EVERY DAY BEFORE BREAKFAST   ezetimibe  (ZETIA ) 10 MG tablet TAKE 1 TABLET BY MOUTH EVERY DAY   Insulin  Pen Needle (PEN NEEDLES) 33G X 4 MM MISC Use to administer insulin  daily   isosorbide  mononitrate (IMDUR ) 120 MG 24 hr tablet TAKE 1 TABLET BY MOUTH EVERY DAY   losartan -hydrochlorothiazide (HYZAAR) 100-25 MG tablet Take 1 tablet by mouth daily.   metFORMIN  (GLUCOPHAGE ) 1000 MG tablet TAKE 1 TABLET BY MOUTH TWICE A DAY   nitroGLYCERIN  (NITROSTAT ) 0.4 MG SL tablet Place 1 tablet (0.4 mg total) under the tongue every 5 (five) minutes as needed. Maximum of 3 doses.   TRESIBA  FLEXTOUCH 100 UNIT/ML FlexTouch Pen INJECT UP TO 50 UNITS INTO THE SKIN DAILY.   No current facility-administered medications on file prior to visit.    Review of Systems  Constitutional:  Negative for  activity change, appetite change, chills, diaphoresis, fatigue and fever.  HENT:  Negative for congestion and hearing loss.   Eyes:  Negative for visual disturbance.  Respiratory:  Negative for cough, chest tightness, shortness of breath and wheezing.   Cardiovascular:  Negative for chest pain, palpitations and leg swelling.  Gastrointestinal:  Negative for abdominal pain, constipation, diarrhea, nausea and vomiting.  Genitourinary:  Negative for dysuria, frequency and hematuria.  Musculoskeletal:  Negative for arthralgias and neck pain.  Skin:  Negative for rash.  Neurological:  Negative for dizziness, weakness, light-headedness, numbness and headaches.  Hematological:  Negative for adenopathy.  Psychiatric/Behavioral:  Negative for behavioral problems, dysphoric mood and sleep disturbance.    Per HPI unless specifically indicated above     Objective:    BP 132/60 (BP Location: Left Arm, Patient Position: Sitting, Cuff Size: Normal)   Pulse 65   Ht 5' 6 (1.676 m)   Wt 151 lb 4 oz (68.6 kg)   SpO2 98%   BMI 24.41 kg/m   Wt Readings from Last 3 Encounters:  03/26/24 151 lb 4 oz (68.6 kg)  12/19/23 163 lb (73.9 kg)  09/19/23 166 lb (75.3 kg)    Physical Exam Vitals and nursing note reviewed.  Constitutional:      General: He is not in acute distress.    Appearance: He is well-developed. He is not diaphoretic.     Comments: Well-appearing, comfortable, cooperative  HENT:     Head: Normocephalic and atraumatic.  Eyes:     General:        Right eye: No discharge.        Left eye: No discharge.     Conjunctiva/sclera: Conjunctivae normal.     Pupils: Pupils are equal, round, and reactive to light.  Neck:     Thyroid : No thyromegaly.  Vascular: No carotid bruit.  Cardiovascular:     Rate and Rhythm: Normal rate and regular rhythm.     Pulses: Normal pulses.     Heart sounds: Normal heart sounds. No murmur heard. Pulmonary:     Effort: Pulmonary effort is normal. No  respiratory distress.     Breath sounds: Normal breath sounds. No wheezing or rales.  Abdominal:     General: Bowel sounds are normal. There is no distension.     Palpations: Abdomen is soft. There is no mass.     Tenderness: There is no abdominal tenderness.  Musculoskeletal:        General: No tenderness. Normal range of motion.     Cervical back: Normal range of motion and neck supple.     Right lower leg: No edema.     Left lower leg: No edema.     Comments: Upper / Lower Extremities: - Normal muscle tone, strength bilateral upper extremities 5/5, lower extremities 5/5  Lymphadenopathy:     Cervical: No cervical adenopathy.  Skin:    General: Skin is warm and dry.     Findings: No erythema or rash.  Neurological:     Mental Status: He is alert and oriented to person, place, and time.     Comments: Distal sensation intact to light touch all extremities  Psychiatric:        Mood and Affect: Mood normal.        Behavior: Behavior normal.        Thought Content: Thought content normal.     Comments: Well groomed, good eye contact, normal speech and thoughts     Results for orders placed or performed in visit on 03/21/24  Comprehensive metabolic panel with GFR   Collection Time: 03/21/24  8:00 AM  Result Value Ref Range   Glucose, Bld 141 (H) 65 - 99 mg/dL   BUN 11 7 - 25 mg/dL   Creat 9.30 (L) 9.29 - 1.35 mg/dL   eGFR 896 > OR = 60 fO/fpw/8.26f7   BUN/Creatinine Ratio 16 6 - 22 (calc)   Sodium 137 135 - 146 mmol/L   Potassium 4.2 3.5 - 5.3 mmol/L   Chloride 102 98 - 110 mmol/L   CO2 24 20 - 32 mmol/L   Calcium  9.3 8.6 - 10.3 mg/dL   Total Protein 6.9 6.1 - 8.1 g/dL   Albumin 4.5 3.6 - 5.1 g/dL   Globulin 2.4 1.9 - 3.7 g/dL (calc)   AG Ratio 1.9 1.0 - 2.5 (calc)   Total Bilirubin 0.7 0.2 - 1.2 mg/dL   Alkaline phosphatase (APISO) 60 35 - 144 U/L   AST 14 10 - 35 U/L   ALT 15 9 - 46 U/L  TSH   Collection Time: 03/21/24  8:00 AM  Result Value Ref Range   TSH 1.54  0.40 - 4.50 mIU/L  PSA   Collection Time: 03/21/24  8:00 AM  Result Value Ref Range   PSA 0.29 < OR = 4.00 ng/mL  CBC with Differential/Platelet   Collection Time: 03/21/24  8:00 AM  Result Value Ref Range   WBC 6.5 3.8 - 10.8 Thousand/uL   RBC 5.01 4.20 - 5.80 Million/uL   Hemoglobin 14.6 13.2 - 17.1 g/dL   HCT 56.3 61.4 - 49.9 %   MCV 87.0 80.0 - 100.0 fL   MCH 29.1 27.0 - 33.0 pg   MCHC 33.5 32.0 - 36.0 g/dL   RDW 86.8 88.9 - 84.9 %   Platelets  154 140 - 400 Thousand/uL   MPV 11.1 7.5 - 12.5 fL   Neutro Abs 3,887 1,500 - 7,800 cells/uL   Absolute Lymphocytes 1,859 850 - 3,900 cells/uL   Absolute Monocytes 481 200 - 950 cells/uL   Eosinophils Absolute 221 15 - 500 cells/uL   Basophils Absolute 52 0 - 200 cells/uL   Neutrophils Relative % 59.8 %   Total Lymphocyte 28.6 %   Monocytes Relative 7.4 %   Eosinophils Relative 3.4 %   Basophils Relative 0.8 %  Hemoglobin A1c   Collection Time: 03/21/24  8:00 AM  Result Value Ref Range   Hgb A1c MFr Bld 8.7 (H) <5.7 %   Mean Plasma Glucose 203 mg/dL   eAG (mmol/L) 88.7 mmol/L  Lipid panel   Collection Time: 03/21/24  8:00 AM  Result Value Ref Range   Cholesterol 107 <200 mg/dL   HDL 29 (L) > OR = 40 mg/dL   Triglycerides 841 (H) <150 mg/dL   LDL Cholesterol (Calc) 54 mg/dL (calc)   Total CHOL/HDL Ratio 3.7 <5.0 (calc)   Non-HDL Cholesterol (Calc) 78 <869 mg/dL (calc)  Microalbumin / creatinine urine ratio   Collection Time: 03/21/24  9:13 AM  Result Value Ref Range   Creatinine, Urine 117 20 - 320 mg/dL   Microalb, Ur 1.4 mg/dL   Microalb Creat Ratio 12 <30 mg/g creat      Assessment & Plan:   Problem List Items Addressed This Visit     Essential hypertension   History of CVA (cerebrovascular accident) without residual deficits   Mixed hyperlipidemia   Type 2 diabetes mellitus with other specified complication (HCC)   Relevant Medications   RYBELSUS  7 MG TABS   Other Visit Diagnoses       Annual physical exam     -  Primary     Need for Streptococcus pneumoniae vaccination       Relevant Orders   Pneumococcal conjugate vaccine 20-valent (Completed)     Long term current use of insulin  (HCC)         Long term current use of oral hypoglycemic drug            Updated Health Maintenance information Reviewed recent lab results with patient Encouraged improvement to lifestyle with diet and exercise Goal of weight loss  History of CVA On anticoagulation Xarelto ASA 81mg  and Statin  Hypertension On losartan -hydrochlorothiazide 100-25mg , Carvedilol  6.25mg  BID  Type 2 Diabetes Mellitus A1c improved to 8.7% from 9.3 Plan to enhance glycemic control by adjusting Rybelsus  and insulin . Continue Jardiance , Metformin   - Increase Rybelsus  from 3 mg to 7 mg daily. - Decrease Tresiba  insulin  from 50 units to 35-40 units, further decrease if tolerated. He is now doing intermittent insulin  dosing based on CBG - Consider increasing Rybelsus  to 14 mg in three months if needed. - Monitor fasting blood glucose levels regularly. - [facility]  BPH LUTS Slow urinary stream suggests prostate enlargement. No signs of prostate cancer. Considering saw palmetto for initial treatment. Normal PSA - Start saw palmetto supplement, 80 or 160 mg twice a day. - Consider Flomax  if symptoms do not improve with saw palmetto.  General Health Maintenance Vaccinations and screenings discussed. Pneumonia vaccine accepted. Shingles vaccine deferred due to concerns about side effects. Eye exam records need updating. - Administer Prevnar 20 vaccine for pneumonia today. - Discussed shingles vaccine side effects; deferred for now. - Update eye exam records; obtain recent exam  from [place].         Orders Placed This Encounter  Procedures   Pneumococcal conjugate vaccine 20-valent    Meds ordered this encounter  Medications   RYBELSUS  7 MG TABS    Sig: Take 1 tablet (7 mg  total) by mouth daily before breakfast. Avoid other meds and food for 30 min after taking.    Dispense:  90 tablet    Refill:  0    Dose increase Rybelsus  from 3 to 7mg      Follow up plan: Return in about 3 months (around 06/26/2024) for 3 month DM A1c, BPH updates.  Marsa Officer, DO [facility] 03/26/2024, 3:57 PM

## 2024-03-26 NOTE — Patient Instructions (Addendum)
 Thank you for coming to the office today.  Dose increase Rybelsus  from 3mg  up to 7mg .  Gradually decrease the Tresiba  insulin  from 50u down to 35 or 40 u then further decrease if tolerated. Ideally when you use it - ideally down to 20-25u.  Eventually higher dose of Rybelsus  14mg , we can keep reducing and maybe stop the insulin .  If you want to skip some days on insulin  that is fine.  Prevnar20 vaccine today for pneumonia, done for 5-10 years.  Future Shingles vaccine  ----------------------------------------  Saw Palmetto OTC herbal 80-160mg  twice a day or more, they may also have 320mg .   Please schedule a Follow-up Appointment to: Return in about 3 months (around 06/26/2024) for 3 month DM A1c, BPH updates.  If you have any other questions or concerns, please feel free to call the office or send a message through MyChart. You may also schedule an earlier appointment if necessary.  Additionally, you may be receiving a survey about your experience at our office within a few days to 1 week by e-mail or mail. We value your feedback.  Marsa Officer, DO [facility], [place]

## 2024-04-20 ENCOUNTER — Other Ambulatory Visit: Payer: Self-pay | Admitting: Family Medicine

## 2024-04-20 DIAGNOSIS — E1169 Type 2 diabetes mellitus with other specified complication: Secondary | ICD-10-CM

## 2024-04-23 NOTE — Telephone Encounter (Signed)
 Requested medication (s) are due for refill today:   Yes  Requested medication (s) are on the active medication list:   Yes  Future visit scheduled:   Yes 10/24    Last ordered: Tresiba  03/18/2024 15 ml, 0 refills;   Rybelsus  03/26/2024 #90, 0 refill  No protocol assigned     Requested Prescriptions  Pending Prescriptions Disp Refills   TRESIBA  FLEXTOUCH 100 UNIT/ML FlexTouch Pen [Pharmacy Med Name: TRESIBA  FLEXTOUCH 100 UNIT/ML]      Sig: INJECT UP TO 50 UNITS INTO THE SKIN DAILY.     Endocrinology:  Diabetes - Insulins Failed - 04/23/2024  3:31 PM      Failed - HBA1C is between 0 and 7.9 and within 180 days    Hemoglobin A1C  Date Value Ref Range Status  11/25/2011 6.8 (H) 4.2 - 6.3 % Final    Comment:    The American Diabetes Association recommends that a primary goal of therapy should be <7% and that physicians should reevaluate the treatment regimen in patients with HbA1c values consistently >8%.    HbA1c POC (<> result, manual entry)  Date Value Ref Range Status  05/10/2022 7.5 4.0 - 5.6 % Final   Hgb A1c MFr Bld  Date Value Ref Range Status  03/21/2024 8.7 (H) <5.7 % Final    Comment:    For someone without known diabetes, a hemoglobin A1c value of 6.5% or greater indicates that they may have  diabetes and this should be confirmed with a follow-up  test. . For someone with known diabetes, a value <7% indicates  that their diabetes is well controlled and a value  greater than or equal to 7% indicates suboptimal  control. A1c targets should be individualized based on  duration of diabetes, age, comorbid conditions, and  other considerations. . Currently, no consensus exists regarding use of hemoglobin A1c for diagnosis of diabetes for children. SABRA Amy - Valid encounter within last 6 months    Recent Outpatient Visits           4 weeks ago Annual physical exam   [place] [facility] [place], Marsa PARAS, DO   4 months ago  Type 2 diabetes mellitus with other specified complication, with long-term current use of insulin  [facility])   [place], Marsa PARAS, DO               RYBELSUS  7 MG TABS [Pharmacy Med Name: RYBELSUS  7 MG TABLET] 90 tablet 0    Sig: TAKE 1 TABLET BY MOUTH DAILY BEFORE BREAKFAST. AVOID OTHER MEDS AND FOOD FOR 30 MIN AFTER TAKING.     Off-Protocol Failed - 04/23/2024  3:31 PM      Failed - Medication not assigned to a protocol, review manually.      Passed - Valid encounter within last 12 months    Recent Outpatient Visits           4 weeks ago Annual physical exam   [place] [facility] [place], Marsa PARAS, DO   4 months ago Type 2 diabetes mellitus with other specified complication, with long-term current use of insulin  [facility])    [facility] [place], Marsa PARAS, [place]

## 2024-05-09 ENCOUNTER — Other Ambulatory Visit: Payer: Self-pay | Admitting: Family Medicine

## 2024-05-09 DIAGNOSIS — E1169 Type 2 diabetes mellitus with other specified complication: Secondary | ICD-10-CM

## 2024-05-10 NOTE — Telephone Encounter (Signed)
 Requested medication (s) are due for refill today: yes  Requested medication (s) are on the active medication list: yes  Last refill:  03/26/24  Future visit scheduled: yes  Notes to clinic:   Medication not assigned to a protocol, review manually.      Requested Prescriptions  Pending Prescriptions Disp Refills   TRESIBA  FLEXTOUCH 100 UNIT/ML FlexTouch Pen [Pharmacy Med Name: TRESIBA  FLEXTOUCH 100 UNIT/ML]      Sig: INJECT UP TO 50 UNITS INTO THE SKIN DAILY.     Endocrinology:  Diabetes - Insulins Failed - 05/10/2024  9:02 AM      Failed - HBA1C is between 0 and 7.9 and within 180 days    Hemoglobin A1C  Date Value Ref Range Status  11/25/2011 6.8 (H) 4.2 - 6.3 % Final    Comment:    The American Diabetes Association recommends that a primary goal of therapy should be <7% and that physicians should reevaluate the treatment regimen in patients with HbA1c values consistently >8%.    HbA1c POC (<> result, manual entry)  Date Value Ref Range Status  05/10/2022 7.5 4.0 - 5.6 % Final   Hgb A1c MFr Bld  Date Value Ref Range Status  03/21/2024 8.7 (H) <5.7 % Final    Comment:    For someone without known diabetes, a hemoglobin A1c value of 6.5% or greater indicates that they may have  diabetes and this should be confirmed with a follow-up  test. . For someone with known diabetes, a value <7% indicates  that their diabetes is well controlled and a value  greater than or equal to 7% indicates suboptimal  control. A1c targets should be individualized based on  duration of diabetes, age, comorbid conditions, and  other considerations. . Currently, no consensus exists regarding use of hemoglobin A1c for diagnosis of diabetes for children. SABRA Amy - Valid encounter within last 6 months    Recent Outpatient Visits           1 month ago Annual physical exam   [place] [facility] [place], Marsa PARAS, DO   4 months ago Type 2 diabetes  mellitus with other specified complication, with long-term current use of insulin  [facility])   [place], Alexander J, DO               RYBELSUS  7 MG TABS [Pharmacy Med Name: RYBELSUS  7 MG TABLET] 90 tablet 0    Sig: TAKE 1 TABLET BY MOUTH DAILY BEFORE BREAKFAST. AVOID OTHER MEDS AND FOOD FOR 30 MIN AFTER TAKING.     Off-Protocol Failed - 05/10/2024  9:02 AM      Failed - Medication not assigned to a protocol, review manually.      Passed - Valid encounter within last 12 months    Recent Outpatient Visits           1 month ago Annual physical exam   [place] [facility] [place], Marsa PARAS, DO   4 months ago Type 2 diabetes mellitus with other specified complication, with long-term current use of insulin  [facility])    [facility] [place], Marsa PARAS, [place]

## 2024-05-14 ENCOUNTER — Telehealth: Payer: Self-pay

## 2024-05-14 NOTE — Telephone Encounter (Signed)
 Copied from CRM [id number]. Topic: Clinical - Prescription Issue >> May 14, 2024 11:54 AM Tobias CROME wrote: Reason for CRM: Patient states his jardiance  was sent to the wrong pharmacy. Patient states he [facility] [address] [place]. Patient is close to running out. patient states any [facility] [place] [facility] [address] [facility] [place].    Patient's preferred pharmacy list has been reflected to [facility] [address] [place].   Please assist patient further, [phone]

## 2024-05-14 NOTE — Telephone Encounter (Signed)
 Attempted to reach patient to clarify his Rx. It looks like another provider prescribes this medication for him. Okay to clarify with patient if he calls back.

## 2024-05-31 ENCOUNTER — Ambulatory Visit (INDEPENDENT_AMBULATORY_CARE_PROVIDER_SITE_OTHER): Admitting: Internal Medicine

## 2024-05-31 ENCOUNTER — Encounter: Payer: Self-pay | Admitting: Internal Medicine

## 2024-05-31 VITALS — BP 112/74 | HR 80 | Temp 98.0°F | Ht 66.0 in | Wt 149.0 lb

## 2024-05-31 DIAGNOSIS — J0100 Acute maxillary sinusitis, unspecified: Secondary | ICD-10-CM

## 2024-05-31 DIAGNOSIS — R052 Subacute cough: Secondary | ICD-10-CM | POA: Diagnosis not present

## 2024-05-31 LAB — POC COVID19 BINAXNOW: SARS Coronavirus 2 Ag: NEGATIVE

## 2024-05-31 MED ORDER — AZITHROMYCIN 250 MG PO TABS: ORAL_TABLET | ORAL | 0 refills | Status: AC

## 2024-05-31 NOTE — Patient Instructions (Signed)
 Take Coricidin HBP for the next few days only -  twice a day as needed for congestion

## 2024-05-31 NOTE — Progress Notes (Signed)
 Date:  05/31/2024   Name:  Scott Doyle   DOB:  1961-04-11   MRN:  [id number]   Chief Complaint: Cough (Cough, congestion- yellow production.)  Cough This is a new problem. The current episode started yesterday. The cough is Productive of sputum. Associated symptoms include postnasal drip, a sore throat and shortness of breath. Pertinent negatives include no chest pain, chills, fever, headaches or wheezing.  Sinusitis This is a new problem. The current episode started yesterday. The problem has been gradually worsening since onset. There has been no fever. Associated symptoms include congestion, coughing, shortness of breath, sinus pressure and a sore throat. Pertinent negatives include no chills or headaches.    Review of Systems  Constitutional:  Negative for chills and fever.  HENT:  Positive for congestion, postnasal drip, sinus pressure and sore throat.   Respiratory:  Positive for cough and shortness of breath. Negative for chest tightness and wheezing.   Cardiovascular:  Negative for chest pain and palpitations.  Neurological:  Negative for dizziness, light-headedness and headaches.  Psychiatric/Behavioral:  Negative for sleep disturbance.      Lab Results  Component Value Date   NA 137 03/21/2024   K 4.2 03/21/2024   CO2 24 03/21/2024   GLUCOSE 141 (H) 03/21/2024   BUN 11 03/21/2024   CREATININE 0.69 (L) 03/21/2024   CALCIUM  9.3 03/21/2024   EGFR 103 03/21/2024   GFRNONAA >60 03/07/2023   Lab Results  Component Value Date   CHOL 107 03/21/2024   HDL 29 (L) 03/21/2024   LDLCALC 54 03/21/2024   LDLDIRECT 60.1 02/15/2021   TRIG 158 (H) 03/21/2024   CHOLHDL 3.7 03/21/2024   Lab Results  Component Value Date   TSH 1.54 03/21/2024   Lab Results  Component Value Date   HGBA1C 8.7 (H) 03/21/2024   Lab Results  Component Value Date   WBC 6.5 03/21/2024   HGB 14.6 03/21/2024   HCT 43.6 03/21/2024   MCV 87.0 03/21/2024   PLT 154 03/21/2024   Lab Results   Component Value Date   ALT 15 03/21/2024   AST 14 03/21/2024   ALKPHOS 65 03/07/2023   BILITOT 0.7 03/21/2024   No results found for: MARIEN BOLLS, VD25OH   Patient Active Problem List   Diagnosis Date Noted   Adenomatous polyp of colon    History of compression fracture of spine 05/17/2022   Primary hypertension 05/17/2022   History of CVA (cerebrovascular accident) without residual deficits 12/25/2021   Chronic diastolic CHF (congestive heart failure) (HCC) 12/25/2021   Mixed hyperlipidemia 06/16/2020   Essential hypertension 06/16/2020   Type 2 diabetes mellitus with other specified complication (HCC) 06/16/2020   Skin cancer of anterior chest 06/16/2020   History of affective disorder    Anxiety    Coronary artery disease     Allergies  Allergen Reactions   Ampicillin Hives   Penicillins Hives and Swelling    Past Surgical History:  Procedure Laterality Date   CARDIAC CATHETERIZATION     CARDIAC CATHETERIZATION  08/2014   Patent LIMA to LAD and patent diagonal stents.   COLONOSCOPY WITH PROPOFOL  N/A 06/06/2022   Procedure: COLONOSCOPY WITH PROPOFOL ;  Surgeon: Therisa Bi, MD;  Location: [facility] END[facility];  Service: Gastroenterology;  Laterality: N/A;   CORONARY ARTERY BYPASS GRAFT  2001   [facility]    Social History   Tobacco Use   Smoking status: Former    Current packs/day: 0.00    Types: Cigarettes  Quit date: 2001    Years since quitting: 24.7   Smokeless tobacco: Never  Vaping Use   Vaping status: Never Used  Substance Use Topics   Alcohol use: Yes    Comment: 1-2 glasses of wine occassionally   Drug use: No     Medication list has been reviewed and updated.  Current Meds  Medication Sig   ACCU-CHEK AVIVA PLUS test strip USE ONE STRIP DAILY AS DIRECTED   aspirin  81 MG tablet Take 81 mg by mouth daily.   atorvastatin  (LIPITOR) 40 MG tablet TAKE 1 TABLET BY MOUTH EVERY DAY   azithromycin  (ZITHROMAX  Z-PAK) 250 MG tablet UAD    carvedilol  (COREG ) 6.25 MG tablet TAKE 1 TABLET BY MOUTH TWICE A DAY   clopidogrel  (PLAVIX ) 75 MG tablet Take 1 tablet (75 mg total) by mouth daily.   empagliflozin  (JARDIANCE ) 10 MG TABS tablet TAKE 1 TABLET BY MOUTH EVERY DAY BEFORE BREAKFAST   ezetimibe  (ZETIA ) 10 MG tablet TAKE 1 TABLET BY MOUTH EVERY DAY   Insulin  Pen Needle (PEN NEEDLES) 33G X 4 MM MISC Use to administer insulin  daily   isosorbide  mononitrate (IMDUR ) 120 MG 24 hr tablet TAKE 1 TABLET BY MOUTH EVERY DAY   losartan -hydrochlorothiazide (HYZAAR) 100-25 MG tablet Take 1 tablet by mouth daily.   metFORMIN  (GLUCOPHAGE ) 1000 MG tablet TAKE 1 TABLET BY MOUTH TWICE A DAY   nitroGLYCERIN  (NITROSTAT ) 0.4 MG SL tablet Place 1 tablet (0.4 mg total) under the tongue every 5 (five) minutes as needed. Maximum of 3 doses.   RYBELSUS  7 MG TABS TAKE 1 TABLET BY MOUTH DAILY BEFORE BREAKFAST. AVOID OTHER MEDS AND FOOD FOR 30 MIN AFTER TAKING.   TRESIBA  FLEXTOUCH 100 UNIT/ML FlexTouch Pen Inject up to 35 Units into the skin daily.       05/31/2024    9:35 AM 03/26/2024    3:40 PM 12/19/2023   10:37 AM  GAD 7 : Generalized Anxiety Score  Nervous, Anxious, on Edge 0 0 0  Control/stop worrying 0 0 0  Worry too much - different things 0 0 0  Trouble relaxing 0 0 0  Restless 0 0 0  Easily annoyed or irritable 0 0 0  Afraid - awful might happen 0 0 0  Total GAD 7 Score 0 0 0  Anxiety Difficulty Not difficult at all  Not difficult at all       05/31/2024    9:35 AM 03/26/2024    3:40 PM 12/19/2023   10:37 AM  Depression screen PHQ 2/9  Decreased Interest 0 0 0  Down, Depressed, Hopeless 0 0 0  PHQ - 2 Score 0 0 0  Altered sleeping 0  0  Tired, decreased energy 0  0  Change in appetite 0  0  Feeling bad or failure about yourself  0  0  Trouble concentrating 0  0  Moving slowly or fidgety/restless 0  0  Suicidal thoughts 0  0  PHQ-9 Score 0  0  Difficult doing work/chores Not difficult at all  Not difficult at all    BP Readings  from Last 3 Encounters:  05/31/24 112/74  03/26/24 132/60  12/19/23 134/78    Physical Exam Constitutional:      Appearance: Normal appearance. He is well-developed.  HENT:     Right Ear: Ear canal and external ear normal. Tympanic membrane is not erythematous or retracted.     Left Ear: Ear canal and external ear normal. Tympanic membrane is not erythematous  or retracted.     Nose:     Right Sinus: Maxillary sinus tenderness present. No frontal sinus tenderness.     Left Sinus: Maxillary sinus tenderness present. No frontal sinus tenderness.     Mouth/Throat:     Mouth: No oral lesions.     Pharynx: Uvula midline. Posterior oropharyngeal erythema present. No oropharyngeal exudate.     Tonsils: No tonsillar exudate.  Cardiovascular:     Rate and Rhythm: Normal rate and regular rhythm.     Heart sounds: Normal heart sounds.  Pulmonary:     Breath sounds: Normal breath sounds. No wheezing or rales.  Lymphadenopathy:     Cervical: No cervical adenopathy.  Neurological:     Mental Status: He is alert and oriented to person, place, and time.     Wt Readings from Last 3 Encounters:  05/31/24 149 lb (67.6 kg)  03/26/24 151 lb 4 oz (68.6 kg)  12/19/23 163 lb (73.9 kg)    BP 112/74   Pulse 80   Temp 98 F (36.7 C)   Ht 5' 6 (1.676 m)   Wt 149 lb (67.6 kg)   SpO2 97%   BMI 24.05 kg/m   Assessment and Plan:  Problem List Items Addressed This Visit   None Visit Diagnoses       Acute non-recurrent maxillary sinusitis    -  Primary   Take Coricidin HBP for the next few days. push fluids Zpak   Relevant Medications   azithromycin  (ZITHROMAX  Z-PAK) 250 MG tablet     Subacute cough       Relevant Orders   POC COVID-19 BinaxNow (Completed)       No follow-ups on file.    Leita HILARIO Adie, MD [facility] [place]

## 2024-06-14 ENCOUNTER — Other Ambulatory Visit: Payer: Self-pay | Admitting: Family Medicine

## 2024-06-17 NOTE — Telephone Encounter (Signed)
 Too soon for refill, LRF 02/12/24 FOR 90 AND 1 RF.  Requested Prescriptions  Pending Prescriptions Disp Refills   losartan -hydrochlorothiazide (HYZAAR) 100-25 MG tablet [Pharmacy Med Name: LOSARTAN -HCTZ 100-25 MG TAB] 90 tablet 1    Sig: TAKE 1 TABLET BY MOUTH EVERY DAY     Cardiovascular: ARB + Diuretic Combos Failed - 06/17/2024  4:10 PM      Failed - Cr in normal range and within 180 days    Creat  Date Value Ref Range Status  03/21/2024 0.69 (L) 0.70 - 1.35 mg/dL Final   Creatinine, Urine  Date Value Ref Range Status  03/21/2024 117 20 - 320 mg/dL Final         Passed - K in normal range and within 180 days    Potassium  Date Value Ref Range Status  03/21/2024 4.2 3.5 - 5.3 mmol/L Final  08/06/2014 3.7 3.5 - 5.1 mmol/L Final         Passed - Na in normal range and within 180 days    Sodium  Date Value Ref Range Status  03/21/2024 137 135 - 146 mmol/L Final  08/03/2021 141 134 - 144 mmol/L Final  08/06/2014 138 136 - 145 mmol/L Final         Passed - eGFR is 10 or above and within 180 days    EGFR (African American)  Date Value Ref Range Status  08/06/2014 >60 >16mL/min Final  06/26/2013 >60  Final   GFR calc Af Amer  Date Value Ref Range Status  12/31/2017 >60 >60 mL/min Final    Comment:    (NOTE) The eGFR has been calculated using the CKD EPI equation. This calculation has not been validated in all clinical situations. eGFR's persistently <60 mL/min signify possible Chronic Kidney Disease.    EGFR (Non-African Amer.)  Date Value Ref Range Status  08/06/2014 >60 >10mL/min Final    Comment:    eGFR values <62mL/min/1.73 m2 may be an indication of chronic kidney disease (CKD). Calculated eGFR, using the MRDR Study equation, is useful in  patients with stable renal function. The eGFR calculation will not be reliable in acutely ill patients when serum creatinine is changing rapidly. It is not useful in patients on dialysis. The eGFR calculation may not be  applicable to patients at the low and high extremes of body sizes, pregnant women, and vegetarians.   06/26/2013 >60  Final    Comment:    eGFR values <80mL/min/1.73 m2 may be an indication of chronic kidney disease (CKD). Calculated eGFR is useful in patients with stable renal function. The eGFR calculation will not be reliable in acutely ill patients when serum creatinine is changing rapidly. It is not useful in  patients on dialysis. The eGFR calculation may not be applicable to patients at the low and high extremes of body sizes, pregnant women, and vegetarians.    GFR, Estimated  Date Value Ref Range Status  03/07/2023 >60 >60 mL/min Final    Comment:    (NOTE) Calculated using the CKD-EPI Creatinine Equation (2021)    eGFR  Date Value Ref Range Status  03/21/2024 103 > OR = 60 mL/min/1.14m2 Final  08/03/2021 73 >59 mL/min/1.73 Final         Passed - Patient is not pregnant      Passed - Last BP in normal range    BP Readings from Last 1 Encounters:  05/31/24 112/74         Passed - Valid encounter within last  6 months    Recent Outpatient Visits           2 weeks ago Acute non-recurrent maxillary sinusitis   [place] [facility], Leita DEL, MD   2 months ago Annual physical exam   [facility] [place] Marsa PARAS, DO   6 months ago Type 2 diabetes mellitus with other specified complication, with long-term current use of insulin  [facility])   [facility] [place], Marsa PARAS, [place]

## 2024-06-28 ENCOUNTER — Encounter: Payer: Self-pay | Admitting: Family Medicine

## 2024-06-28 ENCOUNTER — Ambulatory Visit: Admitting: Family Medicine

## 2024-06-28 VITALS — BP 136/60 | HR 57 | Ht 66.0 in | Wt 152.1 lb

## 2024-06-28 DIAGNOSIS — Z794 Long term (current) use of insulin: Secondary | ICD-10-CM | POA: Diagnosis not present

## 2024-06-28 DIAGNOSIS — Z7984 Long term (current) use of oral hypoglycemic drugs: Secondary | ICD-10-CM

## 2024-06-28 DIAGNOSIS — N401 Enlarged prostate with lower urinary tract symptoms: Secondary | ICD-10-CM

## 2024-06-28 DIAGNOSIS — I5032 Chronic diastolic (congestive) heart failure: Secondary | ICD-10-CM | POA: Diagnosis not present

## 2024-06-28 DIAGNOSIS — N138 Other obstructive and reflux uropathy: Secondary | ICD-10-CM

## 2024-06-28 DIAGNOSIS — E1169 Type 2 diabetes mellitus with other specified complication: Secondary | ICD-10-CM | POA: Diagnosis not present

## 2024-06-28 DIAGNOSIS — Z8673 Personal history of transient ischemic attack (TIA), and cerebral infarction without residual deficits: Secondary | ICD-10-CM

## 2024-06-28 LAB — POCT GLYCOSYLATED HEMOGLOBIN (HGB A1C): Hemoglobin A1C: 7.4 % — AB (ref 4.0–5.6)

## 2024-06-28 MED ORDER — RYBELSUS 14 MG PO TABS: 14.0000 mg | ORAL_TABLET | Freq: Every day | ORAL | 1 refills | Status: AC

## 2024-06-28 NOTE — Progress Notes (Signed)
 Subjective:    Patient ID: Scott Doyle, male    DOB: 1961-07-21, 63 y.o.   MRN: [id number]  Scott Doyle is a 63 y.o. male presenting on 06/28/2024 for Diabetes   HPI  Discussed the use of AI scribe software for clinical note transcription with the patient, who gave verbal consent to proceed.  History of Present Illness   Scott Doyle is a 63 year old male with type 2 diabetes mellitus who presents for a follow-up visit to discuss diabetes management.    Lower urinary tract symptoms - Nocturia, waking twice nightly with small urine volumes - Previously used Flomax , discontinued - Considering saw palmetto as an herbal supplement for symptom improvement  Sleep disturbance - History of obstructive sleep apnea with prior CPAP use; current usage not specified       History of CVA   BPH LUTS Lower urinary tract symptoms History of enlarged prostate Normal PSA - Slow urinary stream - Previously used Flomax  after heart surgery in 2001 No longer on med.   Hyperlipidemia - History of high cholesterol - Currently managed with atorvastatin  - LDL 54 mg/dL - Triglycerides approximately 150 mg/dL    CAD s/p CABG HLD Followed by [facility] Dr Darron On medication management ASA 81mg , Atorvastatin  40mg , Zetia  10mg , imdur  He underwent heart surgery in 2001, specifically a bypass graft, and mentions being told he is in congestive heart failure now   CHRONIC HTN: Reports Bp controlled Current Meds - Carvedilol  6.25mg  TWICE A DAY, Losartan -hydrochlorothiazide 100-25mg  daily   Reports good compliance, took meds today. Tolerating well, w/o complaints. Denies CP, dyspnea, HA, edema, dizziness / lightheadedness   CHRONIC DM, Type 2: - Type 2 diabetes managed with Rybelsus , metformin , Jardiance , and Tresiba  insulin  - A1c improved from 9.3% to 8.7% over the past three months - Tresiba  insulin  dose adjusted based on morning blood sugar, typically 50 units if blood sugar >150  mg/dL - Daily morning blood glucose monitoring Meds: Tresiba  50u daily, Rybelsus  3mg  daily, Jardiance  10mg  daily, Metformin  1000 TWICE A DAY - Off Glipizide  Currently on ARB  Followed by [facility]  Denies hypoglycemia, polyuria, visual changes, numbness or tingling.       History of Bipolar vs Schizoaffective Disorder He describes prior diagnosis circumstances, difficulty to determine accuracy based on history, seems acute episode of agitation anger and behavioral hospitalization but not clear that he has any chronic mood disorder however. Not on medication, not being managed. Not having any current active issues.      05/31/2024    9:35 AM 03/26/2024    3:40 PM 12/19/2023   10:37 AM  Depression screen PHQ 2/9  Decreased Interest 0 0 0  Down, Depressed, Hopeless 0 0 0  PHQ - 2 Score 0 0 0  Altered sleeping 0  0  Tired, decreased energy 0  0  Change in appetite 0  0  Feeling bad or failure about yourself  0  0  Trouble concentrating 0  0  Moving slowly or fidgety/restless 0  0  Suicidal thoughts 0  0  PHQ-9 Score 0  0  Difficult doing work/chores Not difficult at all  Not difficult at all       05/31/2024    9:35 AM 03/26/2024    3:40 PM 12/19/2023   10:37 AM  GAD 7 : Generalized Anxiety Score  Nervous, Anxious, on Edge 0 0 0  Control/stop worrying 0 0 0  Worry too much - different things  0 0 0  Trouble relaxing 0 0 0  Restless 0 0 0  Easily annoyed or irritable 0 0 0  Afraid - awful might happen 0 0 0  Total GAD 7 Score 0 0 0  Anxiety Difficulty Not difficult at all  Not difficult at all    Social History   Tobacco Use   Smoking status: Former    Current packs/day: 0.00    Types: Cigarettes    Quit date: 2001    Years since quitting: 24.8   Smokeless tobacco: Never  Vaping Use   Vaping status: Never Used  Substance Use Topics   Alcohol use: Yes    Comment: 1-2 glasses of wine occassionally   Drug use: No    Review of Systems Per HPI unless  specifically indicated above     Objective:    BP 136/60 (BP Location: Left Arm, Patient Position: Sitting, Cuff Size: Normal)   Pulse (!) 57   Ht 5' 6 (1.676 m)   Wt 152 lb 2 oz (69 kg)   SpO2 96%   BMI 24.55 kg/m   Wt Readings from Last 3 Encounters:  06/28/24 152 lb 2 oz (69 kg)  05/31/24 149 lb (67.6 kg)  03/26/24 151 lb 4 oz (68.6 kg)    Physical Exam Vitals and nursing note reviewed.  Constitutional:      General: He is not in acute distress.    Appearance: He is well-developed. He is not diaphoretic.     Comments: Well-appearing, comfortable, cooperative  HENT:     Head: Normocephalic and atraumatic.  Eyes:     General:        Right eye: No discharge.        Left eye: No discharge.     Conjunctiva/sclera: Conjunctivae normal.  Neck:     Thyroid : No thyromegaly.  Cardiovascular:     Rate and Rhythm: Normal rate and regular rhythm.     Pulses: Normal pulses.     Heart sounds: Normal heart sounds. No murmur heard. Pulmonary:     Effort: Pulmonary effort is normal. No respiratory distress.     Breath sounds: Normal breath sounds. No wheezing or rales.  Musculoskeletal:        General: Normal range of motion.     Cervical back: Normal range of motion and neck supple.  Lymphadenopathy:     Cervical: No cervical adenopathy.  Skin:    General: Skin is warm and dry.     Findings: No erythema or rash.  Neurological:     Mental Status: He is alert and oriented to person, place, and time. Mental status is at baseline.  Psychiatric:        Behavior: Behavior normal.     Comments: Well groomed, good eye contact, normal speech and thoughts     Results for orders placed or performed in visit on 06/28/24  POCT HgB A1C   Collection Time: 06/28/24  4:09 PM  Result Value Ref Range   Hemoglobin A1C 7.4 (A) 4.0 - 5.6 %   HbA1c POC (<> result, manual entry)     HbA1c, POC (prediabetic range)     HbA1c, POC (controlled diabetic range)        Assessment & Plan:    Problem List Items Addressed This Visit     BPH with obstruction/lower urinary tract symptoms   Chronic diastolic CHF (congestive heart failure) (HCC)   History of CVA (cerebrovascular accident) without residual deficits   Type 2  diabetes mellitus with other specified complication (HCC) - Primary   Relevant Medications   RYBELSUS  14 MG TABS   Other Relevant Orders   POCT HgB A1C (Completed)   Other Visit Diagnoses       Long term current use of oral hypoglycemic drug            Type 2 Diabetes Mellitus HbA1c improved to 7.4%. Goal is <7%, ideally 6%. Plan to increase Rybelsus  to improve control and reduce insulin  use. - Increase Rybelsus  to 14 mg daily. - Continue Metformin  and Jardiance . Future goal to discontinue Metformin  if indicated - Limit insulin  (Tresiba ) to as-needed basis. Or future discontinue - Encourage moderation of alcohol intake. - Encourage increased water intake.  History CVA without residual deficit  CHF HFpEF History of CVA without residual deficit Followed by Cardiology, on med management  Benign Prostatic Hyperplasia (BPH) Experiences nocturia.previously took flomax . Discussed saw palmetto and finasteride. Prefers non-procedural options. - Recommend saw palmetto, 80-160 mg once or twice daily. - Consider finasteride if symptoms persist.  General Health Maintenance Up to date with eye exams and pneumonia vaccinations. Prefers to wait on COVID boosters and shingles vaccine. - Pause on COVID boosters and shingles vaccination. - Ensure annual eye exams are maintained.        Orders Placed This Encounter  Procedures   POCT HgB A1C    Meds ordered this encounter  Medications   RYBELSUS  14 MG TABS    Sig: Take 1 tablet (14 mg total) by mouth daily.    Dispense:  90 tablet    Refill:  1    Dose increase from 7 to 14mg     Follow up plan: Return for 6 month DM A1c.   Marsa Officer, DO [facility] 06/28/2024, 4:20 PM

## 2024-06-28 NOTE — Patient Instructions (Addendum)
 Thank you for coming to the office today.  Recent Labs    03/21/24 0800 06/28/24 1609  HGBA1C 8.7* 7.4*   Dose increase Rybelsus  from 7 to 14. New order sent.  Contact pharmacy if need refills on anything.  Limit insulin  use if possible, only if needed. Hopefully can discontinue or significantly scale back  Try Saw Palmetto 80 or 160mg  or up to max 320mg  twice a day for improving prostate symptoms   Please schedule a Follow-up Appointment to: Return for 6 month DM A1c.  If you have any other questions or concerns, please feel free to call the office or send a message through MyChart. You may also schedule an earlier appointment if necessary.  Additionally, you may be receiving a survey about your experience at our office within a few days to 1 week by e-mail or mail. We value your feedback.  Marsa Officer, DO [facility], [place]

## 2024-08-15 ENCOUNTER — Other Ambulatory Visit: Payer: Self-pay | Admitting: Internal Medicine

## 2024-08-15 DIAGNOSIS — J0100 Acute maxillary sinusitis, unspecified: Secondary | ICD-10-CM

## 2024-08-16 NOTE — Telephone Encounter (Signed)
 Requested medications are due for refill today.  unsure  Requested medications are on the active medications list.  no  Last refill. 05/31/2024 #6 0 rf  Future visit scheduled.   yes  Notes to clinic.  Refill not delegated.    Requested Prescriptions  Pending Prescriptions Disp Refills   azithromycin  (ZITHROMAX ) 250 MG tablet [Pharmacy Med Name: AZITHROMYCIN  250 MG TABLET] 6 tablet     Sig: TAKE 2 TABLETS BY MOUTH TODAY, THEN TAKE 1 TABLET DAILY FOR 4 DAYS AS DIRECTED     Off-Protocol Failed - 08/16/2024  3:23 PM      Failed - Medication not assigned to a protocol, review manually.      Passed - Valid encounter within last 12 months    Recent Outpatient Visits           1 month ago Type 2 diabetes mellitus with other specified complication, with long-term current use of insulin  [facility])   [facility] [place] Marsa PARAS, DO   2 months ago Acute non-recurrent maxillary sinusitis   [place] [facility] [place], Leita DEL, MD   4 months ago Annual physical exam   [facility] [place] Mars[place] PARAS, DO   8 months ago Type 2 diabetes mellitus with other specified complication, with long-term current use of insulin  [facility])   [facility] [facility] [place], Marsa PARAS, [place]

## 2024-09-20 ENCOUNTER — Other Ambulatory Visit: Payer: Self-pay | Admitting: Cardiovascular Disease

## 2024-09-20 DIAGNOSIS — I10 Essential (primary) hypertension: Secondary | ICD-10-CM

## 2024-09-23 NOTE — Telephone Encounter (Signed)
 Lipid Panel done on 03/21/24

## 2024-09-25 NOTE — Progress Notes (Signed)
 Scott Doyle                                          MRN: [id number]   09/25/2024   The [facility] Quality Team Specialist reviewed this patient medical record for the purposes of chart review for care gap closure. The following were reviewed: chart review for care gap closure-diabetic eye exam.    [facility] Quality Team

## 2024-12-30 ENCOUNTER — Ambulatory Visit: Admitting: Family Medicine
